# Patient Record
Sex: Female | Born: 1960 | Race: White | Hispanic: No | Marital: Single | Smoking: Current every day smoker
Health system: Southern US, Community
[De-identification: ages and names within clinical notes are randomized; demographics above are authoritative.]

## PROBLEM LIST (undated history)

## (undated) DIAGNOSIS — F32A Depression, unspecified: Secondary | ICD-10-CM

## (undated) DIAGNOSIS — M5030 Other cervical disc degeneration, unspecified cervical region: Secondary | ICD-10-CM

## (undated) DIAGNOSIS — Z973 Presence of spectacles and contact lenses: Secondary | ICD-10-CM

## (undated) DIAGNOSIS — L4050 Arthropathic psoriasis, unspecified: Secondary | ICD-10-CM

## (undated) DIAGNOSIS — I10 Essential (primary) hypertension: Secondary | ICD-10-CM

## (undated) DIAGNOSIS — J449 Chronic obstructive pulmonary disease, unspecified: Secondary | ICD-10-CM

## (undated) DIAGNOSIS — K219 Gastro-esophageal reflux disease without esophagitis: Secondary | ICD-10-CM

## (undated) DIAGNOSIS — J45909 Unspecified asthma, uncomplicated: Secondary | ICD-10-CM

## (undated) DIAGNOSIS — M1990 Unspecified osteoarthritis, unspecified site: Secondary | ICD-10-CM

## (undated) DIAGNOSIS — F329 Major depressive disorder, single episode, unspecified: Secondary | ICD-10-CM

## (undated) HISTORY — PX: TONSILLECTOMY: SUR1361

## (undated) HISTORY — PX: TUBAL LIGATION: SHX77

---

## 2013-05-25 ENCOUNTER — Inpatient Hospital Stay: Payer: Self-pay | Admitting: Internal Medicine

## 2013-05-25 LAB — CBC
HCT: 49.4 % — ABNORMAL HIGH (ref 35.0–47.0)
HGB: 16.3 g/dL — ABNORMAL HIGH (ref 12.0–16.0)
MCH: 32.2 pg (ref 26.0–34.0)
MCHC: 33.1 g/dL (ref 32.0–36.0)
MCV: 97 fL (ref 80–100)
Platelet: 168 10*3/uL (ref 150–440)
RBC: 5.07 10*6/uL (ref 3.80–5.20)
RDW: 14.5 % (ref 11.5–14.5)
WBC: 7.4 10*3/uL (ref 3.6–11.0)

## 2013-05-25 LAB — BASIC METABOLIC PANEL
Anion Gap: 2 — ABNORMAL LOW (ref 7–16)
BUN: 13 mg/dL (ref 7–18)
CHLORIDE: 101 mmol/L (ref 98–107)
CO2: 33 mmol/L — AB (ref 21–32)
CREATININE: 0.59 mg/dL — AB (ref 0.60–1.30)
Calcium, Total: 8.8 mg/dL (ref 8.5–10.1)
EGFR (African American): >60
EGFR (Non-African Amer.): >60
GLUCOSE: 108 mg/dL — AB (ref 65–99)
OSMOLALITY: 273 (ref 275–301)
Potassium: 4.1 mmol/L (ref 3.5–5.1)
SODIUM: 136 mmol/L (ref 136–145)

## 2013-05-25 LAB — TROPONIN I: Troponin-I: <0.02 ng/mL

## 2013-05-26 LAB — BASIC METABOLIC PANEL
Anion Gap: 1 — ABNORMAL LOW (ref 7–16)
BUN: 12 mg/dL (ref 7–18)
CALCIUM: 8.7 mg/dL (ref 8.5–10.1)
CHLORIDE: 101 mmol/L (ref 98–107)
CREATININE: 0.54 mg/dL — AB (ref 0.60–1.30)
Co2: 31 mmol/L (ref 21–32)
EGFR (Non-African Amer.): >60
Glucose: 141 mg/dL — ABNORMAL HIGH (ref 65–99)
Osmolality: 268 (ref 275–301)
POTASSIUM: 3.8 mmol/L (ref 3.5–5.1)
SODIUM: 133 mmol/L — AB (ref 136–145)

## 2013-05-26 LAB — CBC WITH DIFFERENTIAL/PLATELET
BASOS ABS: 0 10*3/uL (ref 0.0–0.1)
BASOS PCT: 0.4 %
Eosinophil #: 0 10*3/uL (ref 0.0–0.7)
Eosinophil %: 0 %
HCT: 47.2 % — AB (ref 35.0–47.0)
HGB: 15.6 g/dL (ref 12.0–16.0)
LYMPHS ABS: 0.3 10*3/uL — AB (ref 1.0–3.6)
Lymphocyte %: 7.5 %
MCH: 32.1 pg (ref 26.0–34.0)
MCHC: 33 g/dL (ref 32.0–36.0)
MCV: 97 fL (ref 80–100)
Monocyte #: 0.4 x10 3/mm (ref 0.2–0.9)
Monocyte %: 9.1 %
Neutrophil #: 3.3 10*3/uL (ref 1.4–6.5)
Neutrophil %: 83 %
PLATELETS: 152 10*3/uL (ref 150–440)
RBC: 4.85 10*6/uL (ref 3.80–5.20)
RDW: 14.6 % — AB (ref 11.5–14.5)
WBC: 4 10*3/uL (ref 3.6–11.0)

## 2014-01-13 ENCOUNTER — Ambulatory Visit: Payer: Self-pay

## 2014-09-03 NOTE — H&P (Signed)
PATIENT NAME:  Dana Hill, Dana Hill MR#:  [id number] DATE OF BIRTH:  [birth year]  DATE OF ADMISSION:  05/25/2013  PRIMARY CARE PHYSICIAN: Nonlocal.   REFERRING PHYSICIAN: Dr. Ladona Ridgel  CHIEF COMPLAINT: Shortness of breath.   HISTORY OF PRESENT ILLNESS: The patient is a 54 year old female with past medical history of asthma, COPD. Still continues to smoke. Presented to the ER with the chief complaint of shortness of breath. Her shortness of breath has started yesterday and she was unable to lie down because of shortness of breath. At one point she could not breathe and she felt like air is not moving. The patient was wheezing on tripoding with severe shortness of breath. She has been given 1 DuoNeb treatment. Pulse ox was at 88% on room air. The patient was placed on oxygen 2 liters and oxygen went up to 98%. The patient is brought into the ER via EMS. She was given back-to-back neb treatments and Solu-Medrol was given. Chest x-ray has revealed hyperinflated lungs. No pneumonia or pneumothorax. With oxygen, Solu-Medrol and nebulizer treatments the patient started feeling better, but still she is dyspneic with minimal exertion. Hospitalist team is called to admit the patient. During my examination, the patient is reporting that she has been having cold with runny nose, congestion, and productive cough for the past 2 days. Shortness of breath has suddenly gotten worse since yesterday. During my examination, the patient is sitting comfortably and speaking in full sentences. She has reported that her condition is significantly improved after breathing treatments. She admits that she still continues to smoke, but cut down the number of cigarettes. No family members are at bedside. Denies any chest pain. No abdominal pain, nausea, or vomiting.   PAST MEDICAL HISTORY: Hypertension, asthma, psoriasis, COPD.  PAST SURGICAL HISTORY: None.   ALLERGIES: No known drug allergies.   PSYCHOSOCIAL HISTORY: Lives at home. She has  smoked 20 years. She used to smoke 1 pack a day, but recently she cut down the number of cigarettes. Denies alcohol or illicit drug usage.   FAMILY HISTORY: Mother has hypertension. Dad has coronary artery disease.   HOME MEDICATIONS: Methotrexate 7.5 mg subcutaneously, Humira, folic acid orally dose unknown, Celexa 40 mg p.o. once daily.   REVIEW OF SYSTEMS: CONSTITUTIONAL: Denies any fever. Complaining of fatigue.  EYES: Denies blurry vision, double vision.  ENT: Denies epistaxis. Complaining of nasal discharge and congestion.  RESPIRATORY: Complaining of cough. Has COPD and asthma. Denies any hemoptysis.  CARDIOVASCULAR: No chest pain or palpitations.  GASTROINTESTINAL: Denies nausea, vomiting, diarrhea.  GENITOURINARY: No dysuria or hematuria.  ENDOCRINE: Denies polyuria, nocturia, thyroid problems.  HEMATOLOGIC AND LYMPHATIC: No anemia, easy bruising, bleeding.  INTEGUMENT: No acne, rash, lesions. She has chronic history of psoriasis. MUSCULOSKELETAL: No joint pain in the neck and back. Denies gout.  NEUROLOGIC: No vertigo, ataxia.  PSYCHIATRIC: No ADD, OCD.   PHYSICAL EXAMINATION: VITAL SIGNS: Pulse 90, respiration 18, blood pressure 169/78, pulse ox 96% on 2 liters.  GENERAL APPEARANCE: Not in acute distress. Moderately built and nourished.  HEENT: Normocephalic, atraumatic. Pupils are equally reactive to light and accommodation. No scleral icterus. No conjunctival injection. No sinus tenderness. No postnasal drip. Nares are patent but congested.  NECK: Supple. No JVD. No thyromegaly.  LUNGS: Moderate air entry. Coarse bronchial breath sounds and expiratory wheezing is present. No accessory muscle usage. No anterior chest wall tenderness on palpation.  CARDIAC: S1 and S2 normal. Regular rate and rhythm. No murmurs.  GASTROINTESTINAL: Soft. Bowel sounds are positive in  all quadrants. Nontender, nondistended. No hepatosplenomegaly. No masses felt.  NEUROLOGIC: Awake, alert, and  oriented x3. Cranial nerves II through XII are grossly intact. Motor and sensory are intact. Reflexes are 2+.  EXTREMITIES: No edema. No cyanosis. No clubbing. No peripheral edema. Peripheral pulses are 2+.  SKIN: Warm to touch. Normal turgor. No psoriatic plaques were noted at this time.  PSYCHIATRIC: Normal mood and affect.   LABORATORY AND IMAGING STUDIES: Troponin less than 0.02. WBC 7.4, hemoglobin 16.3, hematocrit 49.4, and platelets are 168. ABG on 36% FiO2: PH 7.32, pCO2 62, pO2 184, and bicarb 31.9. Glucose 108, BUN 13, creatinine 0.59, sodium 136, potassium 4.1, chloride 101, CO2 33. Anion gap 2. GFR greater than 60. Serum osmolality 273. Calcium 8.8.   Chest x-ray: Hyperinflation. No pneumonia, pneumothorax.   A 12-lead EKG: Normal sinus rhythm with sinus tachycardia at 92 beats per minute, normal PR and QRS interval. No acute ST-T wave changes.   ASSESSMENT AND PLAN: A 54 year old female presenting to the ER with the chief complaint of nasal congestion and cough associated with shortness of breath since yesterday. Will be admitted with following assessment and plan.  1.  Acute respiratory distress with hypoxia, probably from acute exacerbation of chronic obstructive pulmonary disease. Still continues to smoke. Will admit her to telemetry bed. Solu-Medrol IV will be provided. DuoNeb nebulizer treatments and albuterol treatments as needed. Levofloxacin IV.  2.  Chronic history of psoriasis. The patient is on Humira and methotrexate. Follow up with her physician as scheduled.  3.  Hypertension. Resume her home medication after med reconciliation.  4.  History of smoking. Still continues to smoke. Counseled the patient to quit smoking. The patient verbalized understanding. Three minutes of counseling was provided. 5.  We will provide gastrointestinal and deep vein thrombosis prophylaxis.   The diagnosis and plan of care was discussed in detail with the patient. She is aware of the plan.    TOTAL TIME SPENT ON ADMISSION: 45 minutes.   ____________________________ Ramonita LabAruna Dajon Lazar, MD ag:sb D: 05/25/2013 08:11:43 ET T: 05/25/2013 08:44:23 ET JOB#: [id number]  cc: Ramonita LabAruna Aylen Stradford, MD, <Dictator> Ramonita LabARUNA Gyan Cambre MD ELECTRONICALLY SIGNED 06/06/2013 7:06

## 2014-09-03 NOTE — Discharge Summary (Signed)
PATIENT NAME:  Dana Hill, Dana Hill MR#:  [id number] DATE OF BIRTH:  [birth year]  DATE OF ADMISSION:  05/25/2013 DATE OF DISCHARGE:  05/26/2013  ADMITTING PHYSICIAN: Dr. Amado CoeGouru   DISCHARGING PHYSICIAN: Dr. Enid Baasadhika Marbeth Smedley.   PRIMARY CARE PHYSICIAN: Nonlocal.   DISCHARGE DIAGNOSES: 1.  Acute on chronic obstructive pulmonary disease exacerbation.  2.  Acute bronchitis.  3.  Hypertension.  4.  Psoriasis.  5.  Depression/anxiety.   DISCHARGE MEDICATIONS: 1.  Humira subcutaneously every 2 weeks 40 mg per 0.8 mL  liquid 2.  Methotrexate 7.5 mg subcutaneously weekly.  3.  Celexa 40 mg p.o. daily. 4.  Folic acid orally 1 tablet p.o. daily.  5.  Ibuprofen 800 mg  q. 8 hours p.r.n.  6.  Prednisone taper over 6 days.  7.  Symbicort 160/4.5, two puffs b.i.d.  8.  Spiriva 1 capsule inhalation daily.  9.  Albuterol inhaler 2 puffs q. 6 hours p.r.n. for wheezing or shortness of breath.  10.  Levaquin 500 mg p.o. daily for 5 more days.  11.  Xanax 0.25 mg p.o. b.i.d. p.r.n. for anxiety.   DISCHARGE HOME OXYGEN: None.   DISCHARGE DIET: Low-sodium diet.   DISCHARGE ACTIVITY: As tolerated.    FOLLOWUP INSTRUCTIONS: 1.  PCP follow-up in 1 to 2 weeks.  2.  Smoking cessation.   LABORATORIES AND IMAGING STUDIES PRIOR TO DISCHARGE: WBC 4.0, hemoglobin 15.6, hematocrit 47.2, platelet count 152.   Sodium 133, potassium 3.8, chloride 101, bicarbonate 31, BUN12  , creatinine 0.54, glucose 141 and calcium of 8.7.   ABG showing pH of 7.32, pCO2 62, pO2 184, bicarbonate 31.9 and sats of 98% on 3 liters nasal cannula. Chest x-ray showing hyperinflated lung fields. No pneumonia or pneumothorax.   BRIEF HOSPITAL COURSE: Dana Hill is a 54 year old Caucasian female with past medical history significant for COPD, not on home oxygen, ongoing tobacco use, hypertension, psoriasis who is on immunosuppressants, presents to the hospital secondary to worsening shortness of breath.  1.  Acute on chronic COPD exacerbation  with acute bronchitis. She was requiring up to 3 to 4 liters of oxygen initially on admission. Started on IV steroids, DuoNebs and inhalers. Spiriva was also added to prevent further exacerbation. The patient is feeling a lot better and is being tapered off the steroids and is being discharged home on inhalers and also steroids.  She was on Levaquin for bronchitis symptoms which are improving at this time and is being discharged on Levaquin as well.  The patient is strongly counseled against smoking at the time of discharge and is motivated to quit as she already has some patches at home.  2.  Depression and anxiety. The patient is very anxious, especially anxiety worsening with steroid use so she is being discharged on low-dose Xanax. She is already on Celexa at home.  3.  Psoriasis.  Follow up with her dermatologist. She is on Humira, methotrexate.  4.  Hypertension diet-controlled.   Her course has been otherwise uneventful in the hospital.   DISCHARGE CONDITION: Stable.   DISCHARGE DISPOSITION: Home.   TIME SPENT ON DISCHARGE: 45 minutes.    ____________________________ Enid Baasadhika Shashwat Cleary, MD rk:dp D: 05/26/2013 15:37:02 ET T: 05/26/2013 16:55:23 ET JOB#: [id number]  cc: Enid Baasadhika Casmer Yepiz, MD, <Dictator> Enid BaasADHIKA Kassadee Carawan MD ELECTRONICALLY SIGNED 05/27/2013 14:14

## 2015-02-15 ENCOUNTER — Ambulatory Visit: Payer: BLUE CROSS/BLUE SHIELD | Admitting: Podiatry

## 2015-06-23 ENCOUNTER — Other Ambulatory Visit: Payer: Self-pay | Admitting: Rheumatology

## 2015-06-23 DIAGNOSIS — R109 Unspecified abdominal pain: Secondary | ICD-10-CM

## 2015-07-03 ENCOUNTER — Ambulatory Visit: Payer: BLUE CROSS/BLUE SHIELD

## 2015-07-10 ENCOUNTER — Ambulatory Visit
Admission: RE | Admit: 2015-07-10 | Discharge: 2015-07-10 | Disposition: A | Discharge status: Home / Self Care | Payer: BLUE CROSS/BLUE SHIELD | Source: Ambulatory Visit | Attending: Rheumatology | Admitting: Rheumatology

## 2015-07-10 DIAGNOSIS — R1084 Generalized abdominal pain: Secondary | ICD-10-CM | POA: Diagnosis not present

## 2015-07-10 DIAGNOSIS — R109 Unspecified abdominal pain: Secondary | ICD-10-CM

## 2015-07-10 DIAGNOSIS — K8689 Other specified diseases of pancreas: Secondary | ICD-10-CM | POA: Diagnosis not present

## 2015-08-22 ENCOUNTER — Other Ambulatory Visit: Payer: Self-pay | Admitting: Rheumatology

## 2015-08-22 DIAGNOSIS — M542 Cervicalgia: Secondary | ICD-10-CM

## 2015-09-12 ENCOUNTER — Ambulatory Visit
Admission: RE | Admit: 2015-09-12 | Discharge: 2015-09-12 | Disposition: A | Discharge status: Home / Self Care | Payer: BLUE CROSS/BLUE SHIELD | Source: Ambulatory Visit | Attending: Rheumatology | Admitting: Rheumatology

## 2015-09-12 DIAGNOSIS — M542 Cervicalgia: Secondary | ICD-10-CM

## 2015-09-15 ENCOUNTER — Ambulatory Visit
Admission: RE | Admit: 2015-09-15 | Discharge: 2015-09-15 | Disposition: A | Discharge status: Home / Self Care | Payer: BLUE CROSS/BLUE SHIELD | Source: Ambulatory Visit | Attending: Rheumatology | Admitting: Rheumatology

## 2015-09-15 DIAGNOSIS — M5031 Other cervical disc degeneration,  high cervical region: Secondary | ICD-10-CM | POA: Diagnosis not present

## 2015-09-15 DIAGNOSIS — M4802 Spinal stenosis, cervical region: Secondary | ICD-10-CM | POA: Diagnosis not present

## 2015-09-15 DIAGNOSIS — M1388 Other specified arthritis, other site: Secondary | ICD-10-CM | POA: Insufficient documentation

## 2015-09-15 DIAGNOSIS — M542 Cervicalgia: Secondary | ICD-10-CM | POA: Diagnosis present

## 2016-04-18 ENCOUNTER — Encounter: Payer: Self-pay | Admitting: *Deleted

## 2016-04-19 NOTE — Discharge Instructions (Signed)
Cataract Surgery, Care After °Refer to this sheet in the next few weeks. These instructions provide you with information about caring for yourself after your procedure. Your health care provider may also give you more specific instructions. Your treatment has been planned according to current medical practices, but problems sometimes occur. Call your health care provider if you have any problems or questions after your procedure. °What can I expect after the procedure? °After the procedure, it is common to have: °· Itching. °· Discomfort. °· Fluid discharge. °· Sensitivity to light and to touch. °· Bruising. °Follow these instructions at home: °Eye Care  °· Check your eye every day for signs of infection. Watch for: °¨ Redness, swelling, or pain. °¨ Fluid, blood, or pus. °¨ Warmth. °¨ Bad smell. °Activity  °· Avoid strenuous activities, such as playing contact sports, for as long as told by your health care provider. °· Do not drive or operate heavy machinery until your health care provider approves. °· Do not bend or lift heavy objects . Bending increases pressure in the eye. You can walk, climb stairs, and do light household chores. °· Ask your health care provider when you can return to work. If you work in a dusty environment, you may be advised to wear protective eyewear for a period of time. °General instructions  °· Take or apply over-the-counter and prescription medicines only as told by your health care provider. This includes eye drops. °· Do not touch or rub your eyes. °· If you were given a protective shield, wear it as told by your health care provider. If you were not given a protective shield, wear sunglasses as told by your health care provider to protect your eyes. °· Keep the area around your eye clean and dry. Avoid swimming or allowing water to hit you directly in the face while showering until told by your health care provider. Keep soap and shampoo out of your eyes. °· Do not put a contact lens  into the affected eye or eyes until your health care provider approves. °· Keep all follow-up visits as told by your health care provider. This is important. °Contact a health care provider if: ° °· You have increased bruising around your eye. °· You have pain that is not helped with medicine. °· You have a fever. °· You have redness, swelling, or pain in your eye. °· You have fluid, blood, or pus coming from your incision. °· Your vision gets worse. °Get help right away if: °· You have sudden vision loss. °This information is not intended to replace advice given to you by your health care provider. Make sure you discuss any questions you have with your health care provider. °Document Released: 11/16/2004 Document Revised: 09/07/2015 Document Reviewed: 03/09/2015 °Elsevier Interactive Patient Education © 2017 [employer] ° ° ° ° °General Anesthesia, Adult, Care After °These instructions provide you with information about caring for yourself after your procedure. Your health care provider may also give you more specific instructions. Your treatment has been planned according to current medical practices, but problems sometimes occur. Call your health care provider if you have any problems or questions after your procedure. °What can I expect after the procedure? °After the procedure, it is common to have: °· Vomiting. °· A sore throat. °· Mental slowness. °It is common to feel: °· Nauseous. °· Cold or shivery. °· Sleepy. °· Tired. °· Sore or achy, even in parts of your body where you did not have surgery. °Follow these instructions at   home: °For at least 24 hours after the procedure:  °· Do not: °¨ Participate in activities where you could fall or become injured. °¨ Drive. °¨ Use heavy machinery. °¨ Drink alcohol. °¨ Take sleeping pills or medicines that cause drowsiness. °¨ Make important decisions or sign legal documents. °¨ Take care of children on your own. °· Rest. °Eating and drinking  °· If you vomit, drink  water, juice, or soup when you can drink without vomiting. °· Drink enough fluid to keep your urine clear or pale yellow. °· Make sure you have little or no nausea before eating solid foods. °· Follow the diet recommended by your health care provider. °General instructions  °· Have a responsible adult stay with you until you are awake and alert. °· Return to your normal activities as told by your health care provider. Ask your health care provider what activities are safe for you. °· Take over-the-counter and prescription medicines only as told by your health care provider. °· If you smoke, do not smoke without supervision. °· Keep all follow-up visits as told by your health care provider. This is important. °Contact a health care provider if: °· You continue to have nausea or vomiting at home, and medicines are not helpful. °· You cannot drink fluids or start eating again. °· You cannot urinate after 8-12 hours. °· You develop a skin rash. °· You have fever. °· You have increasing redness at the site of your procedure. °Get help right away if: °· You have difficulty breathing. °· You have chest pain. °· You have unexpected bleeding. °· You feel that you are having a life-threatening or urgent problem. °This information is not intended to replace advice given to you by your health care provider. Make sure you discuss any questions you have with your health care provider. °Document Released: 08/05/2000 Document Revised: 10/02/2015 Document Reviewed: 04/13/2015 °Elsevier Interactive Patient Education © 2017 [employer] ° °

## 2016-04-24 ENCOUNTER — Ambulatory Visit: Payer: BLUE CROSS/BLUE SHIELD | Admitting: Anesthesiology

## 2016-04-24 ENCOUNTER — Encounter
Admission: RE | Disposition: A | Discharge status: Home / Self Care | Payer: Self-pay | Source: Ambulatory Visit | Attending: Ophthalmology

## 2016-04-24 ENCOUNTER — Ambulatory Visit
Admission: RE | Admit: 2016-04-24 | Discharge: 2016-04-24 | Disposition: A | Discharge status: Home / Self Care | Payer: BLUE CROSS/BLUE SHIELD | Source: Ambulatory Visit | Attending: Ophthalmology | Admitting: Ophthalmology

## 2016-04-24 DIAGNOSIS — L4050 Arthropathic psoriasis, unspecified: Secondary | ICD-10-CM | POA: Insufficient documentation

## 2016-04-24 DIAGNOSIS — F329 Major depressive disorder, single episode, unspecified: Secondary | ICD-10-CM | POA: Diagnosis not present

## 2016-04-24 DIAGNOSIS — J449 Chronic obstructive pulmonary disease, unspecified: Secondary | ICD-10-CM | POA: Insufficient documentation

## 2016-04-24 DIAGNOSIS — I10 Essential (primary) hypertension: Secondary | ICD-10-CM | POA: Diagnosis not present

## 2016-04-24 DIAGNOSIS — M7989 Other specified soft tissue disorders: Secondary | ICD-10-CM | POA: Diagnosis not present

## 2016-04-24 DIAGNOSIS — M810 Age-related osteoporosis without current pathological fracture: Secondary | ICD-10-CM | POA: Insufficient documentation

## 2016-04-24 DIAGNOSIS — H2512 Age-related nuclear cataract, left eye: Secondary | ICD-10-CM | POA: Diagnosis present

## 2016-04-24 DIAGNOSIS — K219 Gastro-esophageal reflux disease without esophagitis: Secondary | ICD-10-CM | POA: Diagnosis not present

## 2016-04-24 HISTORY — DX: Depression, unspecified: F32.A

## 2016-04-24 HISTORY — DX: Unspecified osteoarthritis, unspecified site: M19.90

## 2016-04-24 HISTORY — DX: Presence of spectacles and contact lenses: Z97.3

## 2016-04-24 HISTORY — DX: Major depressive disorder, single episode, unspecified: F32.9

## 2016-04-24 HISTORY — DX: Chronic obstructive pulmonary disease, unspecified: J44.9

## 2016-04-24 HISTORY — DX: Essential (primary) hypertension: I10

## 2016-04-24 HISTORY — DX: Other cervical disc degeneration, unspecified cervical region: M50.30

## 2016-04-24 HISTORY — PX: CATARACT EXTRACTION W/PHACO: SHX586

## 2016-04-24 HISTORY — DX: Arthropathic psoriasis, unspecified: L40.50

## 2016-04-24 HISTORY — DX: Gastro-esophageal reflux disease without esophagitis: K21.9

## 2016-04-24 HISTORY — DX: Unspecified asthma, uncomplicated: J45.909

## 2016-04-24 SURGERY — PHACOEMULSIFICATION, CATARACT, WITH IOL INSERTION
Anesthesia: Monitor Anesthesia Care | Site: Eye | Laterality: Left | Wound class: Clean

## 2016-04-24 MED ORDER — LACTATED RINGERS IV SOLN: INTRAVENOUS | Status: DC

## 2016-04-24 MED ORDER — ACETAMINOPHEN 325 MG PO TABS: 325.0000 mg | ORAL_TABLET | ORAL | Status: DC | PRN

## 2016-04-24 MED ORDER — MIDAZOLAM HCL 2 MG/2ML IJ SOLN
INTRAMUSCULAR | Status: DC | PRN
  Administered 2016-04-24: 2 mg via INTRAVENOUS

## 2016-04-24 MED ORDER — TIMOLOL MALEATE 0.5 % OP SOLN
OPHTHALMIC | Status: DC | PRN
  Administered 2016-04-24: 1 [drp] via OPHTHALMIC

## 2016-04-24 MED ORDER — CEFUROXIME OPHTHALMIC INJECTION 1 MG/0.1 ML
INJECTION | OPHTHALMIC | Status: DC | PRN
Start: 2016-04-24 — End: 2016-04-24
  Administered 2016-04-24: .3 mL via INTRACAMERAL

## 2016-04-24 MED ORDER — LIDOCAINE HCL (PF) 4 % IJ SOLN
INTRAOCULAR | Status: DC | PRN
  Administered 2016-04-24: 1 mL via OPHTHALMIC

## 2016-04-24 MED ORDER — FENTANYL CITRATE (PF) 100 MCG/2ML IJ SOLN
INTRAMUSCULAR | Status: DC | PRN
  Administered 2016-04-24: 50 ug via INTRAVENOUS

## 2016-04-24 MED ORDER — NA HYALUR & NA CHOND-NA HYALUR 0.4-0.35 ML IO KIT
PACK | INTRAOCULAR | Status: DC | PRN
  Administered 2016-04-24: 1 mL via INTRAOCULAR

## 2016-04-24 MED ORDER — ACETAMINOPHEN 160 MG/5ML PO SOLN: 325.0000 mg | ORAL | Status: DC | PRN

## 2016-04-24 MED ORDER — BRIMONIDINE TARTRATE 0.2 % OP SOLN
OPHTHALMIC | Status: DC | PRN
  Administered 2016-04-24: 1 [drp] via OPHTHALMIC

## 2016-04-24 MED ORDER — EPINEPHRINE PF 1 MG/ML IJ SOLN
INTRAMUSCULAR | Status: DC | PRN
  Administered 2016-04-24: 53 mL via OPHTHALMIC

## 2016-04-24 MED ORDER — ALBUTEROL SULFATE (2.5 MG/3ML) 0.083% IN NEBU
2.5000 mg | INHALATION_SOLUTION | Freq: Once | RESPIRATORY_TRACT | Status: AC
  Administered 2016-04-24: 2.5 mg via RESPIRATORY_TRACT

## 2016-04-24 MED ORDER — ARMC OPHTHALMIC DILATING DROPS
1.0000 "application " | OPHTHALMIC | Status: DC | PRN
  Administered 2016-04-24 (×3): 1 via OPHTHALMIC

## 2016-04-24 MED ORDER — MOXIFLOXACIN HCL 0.5 % OP SOLN
1.0000 [drp] | OPHTHALMIC | Status: DC | PRN
  Administered 2016-04-24 (×3): 1 [drp] via OPHTHALMIC

## 2016-04-24 SURGICAL SUPPLY — 25 items
CANNULA ANT/CHMB 27GA (MISCELLANEOUS) ×3 IMPLANT
CARTRIDGE ABBOTT (MISCELLANEOUS) IMPLANT
GLOVE SURG LX 7.5 STRW (GLOVE) ×2
GLOVE SURG LX STRL 7.5 STRW (GLOVE) ×1 IMPLANT
GLOVE SURG TRIUMPH 8.0 PF LTX (GLOVE) ×3 IMPLANT
GOWN STRL REUS W/ TWL LRG LVL3 (GOWN DISPOSABLE) ×2 IMPLANT
GOWN STRL REUS W/TWL LRG LVL3 (GOWN DISPOSABLE) ×4
LENS IOL TECNIS ITEC 22.5 (Intraocular Lens) ×3 IMPLANT
MARKER SKIN DUAL TIP RULER LAB (MISCELLANEOUS) ×3 IMPLANT
NDL RETROBULBAR .5 NSTRL (NEEDLE) IMPLANT
NEEDLE FILTER BLUNT 18X 1/2SAF (NEEDLE) ×2
NEEDLE FILTER BLUNT 18X1 1/2 (NEEDLE) ×1 IMPLANT
PACK CATARACT BRASINGTON (MISCELLANEOUS) ×3 IMPLANT
PACK EYE AFTER SURG (MISCELLANEOUS) ×3 IMPLANT
PACK OPTHALMIC (MISCELLANEOUS) ×3 IMPLANT
RING MALYGIN 7.0 (MISCELLANEOUS) IMPLANT
SUT ETHILON 10-0 CS-B-6CS-B-6 (SUTURE)
SUT VICRYL  9 0 (SUTURE)
SUT VICRYL 9 0 (SUTURE) IMPLANT
SUTURE EHLN 10-0 CS-B-6CS-B-6 (SUTURE) IMPLANT
SYR 3ML LL SCALE MARK (SYRINGE) ×3 IMPLANT
SYR 5ML LL (SYRINGE) ×3 IMPLANT
SYR TB 1ML LUER SLIP (SYRINGE) ×3 IMPLANT
WATER STERILE IRR 250ML POUR (IV SOLUTION) ×3 IMPLANT
WIPE NON LINTING 3.25X3.25 (MISCELLANEOUS) ×3 IMPLANT

## 2016-04-24 NOTE — Anesthesia Postprocedure Evaluation (Signed)
Anesthesia Post Note  Patient: Dana Hill  Procedure(s) Performed: Procedure(s) (LRB): CATARACT EXTRACTION PHACO AND INTRAOCULAR LENS PLACEMENT (IOC) (Left)  Patient location during evaluation: PACU Anesthesia Type: MAC Level of consciousness: awake and alert Pain management: pain level controlled Vital Signs Assessment: post-procedure vital signs reviewed and stable Respiratory status: spontaneous breathing, nonlabored ventilation, respiratory function stable and patient connected to nasal cannula oxygen Cardiovascular status: stable and blood pressure returned to baseline Anesthetic complications: no    Alta CorningBacon, Mackie Goon S

## 2016-04-24 NOTE — Transfer of Care (Signed)
Immediate Anesthesia Transfer of Care Note  Patient: Dana AbrahamsRobin Mcguffee  Procedure(s) Performed: Procedure(s): CATARACT EXTRACTION PHACO AND INTRAOCULAR LENS PLACEMENT (IOC) (Left)  Patient Location: PACU  Anesthesia Type: MAC  Level of Consciousness: awake, alert  and patient cooperative  Airway and Oxygen Therapy: Patient Spontanous Breathing and Patient connected to supplemental oxygen  Post-op Assessment: Post-op Vital signs reviewed, Patient's Cardiovascular Status Stable, Respiratory Function Stable, Patent Airway and No signs of Nausea or vomiting  Post-op Vital Signs: Reviewed and stable  Complications: No apparent anesthesia complications

## 2016-04-24 NOTE — Anesthesia Procedure Notes (Signed)
Procedure Name: MAC Performed by: Stacyann Mcconaughy Pre-anesthesia Checklist: Patient identified, Emergency Drugs available, Suction available, Timeout performed and Patient being monitored Patient Re-evaluated:Patient Re-evaluated prior to inductionOxygen Delivery Method: Nasal cannula Placement Confirmation: positive ETCO2     

## 2016-04-24 NOTE — Op Note (Signed)
OPERATIVE NOTE  Eldridge AbrahamsRobin Threats [id number] 04/24/2016   PREOPERATIVE DIAGNOSIS:  Nuclear sclerotic cataract left eye. H25.12   POSTOPERATIVE DIAGNOSIS:    Nuclear sclerotic cataract left eye.     PROCEDURE:  Phacoemusification with posterior chamber intraocular lens placement of the left eye   LENS:   Implant Name Type Inv. Item Serial No. Manufacturer Lot No. LRB No. Used  LENS IOL DIOP 22.5 - [id number] Intraocular Lens LENS IOL DIOP 22.5 [id number]   Left 1        ULTRASOUND TIME: 15  % of 0 minutes 58 seconds, CDE 8.5  SURGEON:  Deirdre Evenerhadwick R. Dorance Spink, MD   ANESTHESIA:  Topical with tetracaine drops and 2% Xylocaine jelly, augmented with 1% preservative-free intracameral lidocaine.    COMPLICATIONS:  None.   DESCRIPTION OF PROCEDURE:  The patient was identified in the holding room and transported to the operating room and placed in the supine position under the operating microscope.  The left eye was identified as the operative eye and it was prepped and draped in the usual sterile ophthalmic fashion.   A 1 millimeter clear-corneal paracentesis was made at the 1:30 position.  0.5 ml of preservative-free 1% lidocaine was injected into the anterior chamber.  The anterior chamber was filled with Viscoat viscoelastic.  A 2.4 millimeter keratome was used to make a near-clear corneal incision at the 10:30 position.  .  A curvilinear capsulorrhexis was made with a cystotome and capsulorrhexis forceps.  Balanced salt solution was used to hydrodissect and hydrodelineate the nucleus.   Phacoemulsification was then used in stop and chop fashion to remove the lens nucleus and epinucleus.  The remaining cortex was then removed using the irrigation and aspiration handpiece. Provisc was then placed into the capsular bag to distend it for lens placement.  A lens was then injected into the capsular bag.  The remaining viscoelastic was aspirated.   Wounds were hydrated with balanced salt  solution.  The anterior chamber was inflated to a physiologic pressure with balanced salt solution.  No wound leaks were noted. Cefuroxime 0.1 ml of a 10mg /ml solution was injected into the anterior chamber for a dose of 1 mg of intracameral antibiotic at the completion of the case.   Timolol and Brimonidine drops were applied to the eye.  The patient was taken to the recovery room in stable condition without complications of anesthesia or surgery.  Laquida Cotrell 04/24/2016, 10:25 AM

## 2016-04-24 NOTE — Anesthesia Preprocedure Evaluation (Signed)
Anesthesia Evaluation  Patient identified by MRN, date of birth, ID band Patient awake    Reviewed: Allergy & Precautions, H&P , NPO status , Patient's Chart, lab work & pertinent test results, reviewed documented beta blocker date and time   Airway Mallampati: II  TM Distance: >3 FB Neck ROM: full    Dental no notable dental hx.    Pulmonary asthma , COPD,  COPD inhaler, Current Smoker,     + wheezing      Cardiovascular Exercise Tolerance: Good hypertension, Normal cardiovascular exam Rhythm:regular Rate:Normal     Neuro/Psych negative neurological ROS  negative psych ROS   GI/Hepatic Neg liver ROS, GERD  Medicated,  Endo/Other  negative endocrine ROS  Renal/GU negative Renal ROS  negative genitourinary   Musculoskeletal   Abdominal   Peds  Hematology negative hematology ROS (+)   Anesthesia Other Findings   Reproductive/Obstetrics negative OB ROS                             Anesthesia Physical Anesthesia Plan  ASA: III  Anesthesia Plan: MAC   Post-op Pain Management:    Induction:   Airway Management Planned:   Additional Equipment:   Intra-op Plan:   Post-operative Plan:   Informed Consent: I have reviewed the patients History and Physical, chart, labs and discussed the procedure including the risks, benefits and alternatives for the proposed anesthesia with the patient or authorized representative who has indicated his/her understanding and acceptance.   Dental Advisory Given  Plan Discussed with: CRNA and Anesthesiologist  Anesthesia Plan Comments:         Anesthesia Quick Evaluation

## 2016-04-24 NOTE — H&P (Signed)
The History and Physical notes are on paper, have been signed, and are to be scanned. The patient remains stable and unchanged from the H&P.   Previous H&P reviewed, patient examined, and there are no changes.  Dana Hill 04/24/2016 10:00 AM   

## 2016-04-25 ENCOUNTER — Encounter: Payer: Self-pay | Admitting: Ophthalmology

## 2017-06-17 ENCOUNTER — Other Ambulatory Visit: Payer: Self-pay

## 2017-06-17 ENCOUNTER — Encounter: Payer: BLUE CROSS/BLUE SHIELD | Attending: Internal Medicine

## 2017-06-17 VITALS — Ht 63.5 in | Wt 263.2 lb

## 2017-06-17 DIAGNOSIS — J439 Emphysema, unspecified: Secondary | ICD-10-CM | POA: Diagnosis present

## 2017-06-17 NOTE — Patient Instructions (Signed)
Patient Instructions  [name] MRN: [id number] Date of Birth: Nov 23, 1960 Referring Provider:  Sherol Dade, MD  Below are your personal goals for exercise, nutrition, and risk factors. Our goal is to help you stay on track towards obtaining and maintaining these goals. We will be discussing your progress on these goals with you throughout the program.  Initial Exercise Prescription: Initial Exercise Prescription - 06/17/17 1600      Date of Initial Exercise RX and Referring Provider   Date  06/17/17    Referring Provider  Dione Housekeeper MD      Oxygen   Oxygen  Continuous    Liters  3-4 4L on treadmill      Treadmill   MPH  1.5    Grade  0.5    Minutes  15    METs  2.25      Recumbant Elliptical   Level  1    RPM  50    Minutes  15    METs  2.1      T5 Nustep   Level  1    SPM  80    Minutes  15    METs  2.1      Prescription Details   Frequency (times per week)  3    Duration  Progress to 45 minutes of aerobic exercise without signs/symptoms of physical distress      Intensity   THRR 40-80% of Max Heartrate  106-143    Ratings of Perceived Exertion  11-13    Perceived Dyspnea  0-4      Progression   Progression  Continue to progress workloads to maintain intensity without signs/symptoms of physical distress.      Resistance Training   Training Prescription  Yes    Weight  3 lbs    Reps  10-15       Exercise Goals: Frequency: Be able to perform aerobic exercise two to three times per week in program working toward 2-5 days per week of home exercise.  Intensity: Work with a perceived exertion of 11 (fairly light) - 15 (hard) while following your exercise prescription.  We will make changes to your prescription with you as you progress through the program.   Duration: Be able to do 30 to 45 minutes of continuous aerobic exercise in addition to a 5 minute warm-up and a 5 minute cool-down routine.   Nutrition Goals: Your  personal nutrition goals will be established when you do your nutrition analysis with the dietician.  The following are general nutrition guidelines to follow: Cholesterol < 200mg /day Sodium < 1500mg /day Fiber: Women over 50 yrs - 21 grams per day  Personal Goals: Personal Goals and Risk Factors at Admission - 06/17/17 1526      Core Components/Risk Factors/Patient Goals on Admission    Weight Management  Yes;Obesity;Weight Loss    Intervention  Weight Management: Develop a combined nutrition and exercise program designed to reach desired caloric intake, while maintaining appropriate intake of nutrient and fiber, sodium and fats, and appropriate energy expenditure required for the weight goal.;Weight Management: Provide education and appropriate resources to help participant work on and attain dietary goals.;Weight Management/Obesity: Establish reasonable short term and long term weight goals.;Obesity: Provide education and appropriate resources to help participant work on and attain dietary goals.    Admit Weight  263 lb 3.2 oz (119.4 kg)    Goal Weight: Short Term  258 lb (117 kg)    Goal  Weight: Long Term  160 lb (72.6 kg)    Expected Outcomes  Short Term: Continue to assess and modify interventions until short term weight is achieved;Long Term: Adherence to nutrition and physical activity/exercise program aimed toward attainment of established weight goal;Weight Maintenance: Understanding of the daily nutrition guidelines, which includes 25-35% calories from fat, 7% or less cal from saturated fats, less than 200mg  cholesterol, less than 1.5gm of sodium, & 5 or more servings of fruits and vegetables daily;Weight Loss: Understanding of general recommendations for a balanced deficit meal plan, which promotes 1-2 lb weight loss per week and includes a negative energy balance of [phone] kcal/d;Understanding recommendations for meals to include 15-35% energy as protein, 25-35% energy from fat, 35-60%  energy from carbohydrates, less than 200mg  of dietary cholesterol, 20-35 gm of total fiber daily;Understanding of distribution of calorie intake throughout the day with the consumption of 4-5 meals/snacks    Tobacco Cessation  Yes    Number of packs per day  4 cigarettes a day    Intervention  Assist the participant in steps to quit. Provide individualized education and counseling about committing to Tobacco Cessation, relapse prevention, and pharmacological support that can be provided by physician.;Education officer, environmentalffer self-teaching materials, assist with locating and accessing local/national Quit Smoking programs, and support quit date choice.    Expected Outcomes  Short Term: Will demonstrate readiness to quit, by selecting a quit date.;Long Term: Complete abstinence from all tobacco products for at least 12 months from quit date.;Short Term: Will quit all tobacco product use, adhering to prevention of relapse plan.    Improve shortness of breath with ADL's  Yes    Intervention  Provide education, individualized exercise plan and daily activity instruction to help decrease symptoms of SOB with activities of daily living.    Expected Outcomes  Short Term: Improve cardiorespiratory fitness to achieve a reduction of symptoms when performing ADLs;Long Term: Be able to perform more ADLs without symptoms or delay the onset of symptoms    Hypertension  Yes    Intervention  Provide education on lifestyle modifcations including regular physical activity/exercise, weight management, moderate sodium restriction and increased consumption of fresh fruit, vegetables, and low fat dairy, alcohol moderation, and smoking cessation.;Monitor prescription use compliance.    Expected Outcomes  Short Term: Continued assessment and intervention until BP is < 140/1890mm HG in hypertensive participants. < 130/1080mm HG in hypertensive participants with diabetes, heart failure or chronic kidney disease.;Long Term: Maintenance of blood pressure at  goal levels.       Tobacco Use Initial Evaluation: Social History   Tobacco Use  Smoking Status Current Every Day Smoker  . Packs/day: 0.50  . Years: 25.00  . Pack years: 12.50  . Types: Cigarettes  Smokeless Tobacco Never Used    Exercise Goals and Review: Exercise Goals    Row Name 06/17/17 1628             Exercise Goals   Increase Physical Activity  Yes       Intervention  Provide advice, education, support and counseling about physical activity/exercise needs.;Develop an individualized exercise prescription for aerobic and resistive training based on initial evaluation findings, risk stratification, comorbidities and participant's personal goals.       Expected Outcomes  Long Term: Add in home exercise to make exercise part of routine and to increase amount of physical activity.;Short Term: Attend rehab on a regular basis to increase amount of physical activity.;Long Term: Exercising regularly at least 3-5 days a week.  Increase Strength and Stamina  Yes       Intervention  Provide advice, education, support and counseling about physical activity/exercise needs.;Develop an individualized exercise prescription for aerobic and resistive training based on initial evaluation findings, risk stratification, comorbidities and participant's personal goals.       Expected Outcomes  Short Term: Increase workloads from initial exercise prescription for resistance, speed, and METs.;Short Term: Perform resistance training exercises routinely during rehab and add in resistance training at home;Long Term: Improve cardiorespiratory fitness, muscular endurance and strength as measured by increased METs and functional capacity ( )       Able to understand and use rate of perceived exertion (RPE) scale  Yes       Intervention  Provide education and explanation on how to use RPE scale       Expected Outcomes  Short Term: Able to use RPE daily in rehab to express subjective intensity  level;Long Term:  Able to use RPE to guide intensity level when exercising independently       Able to understand and use Dyspnea scale  Yes       Intervention  Provide education and explanation on how to use Dyspnea scale       Expected Outcomes  Short Term: Able to use Dyspnea scale daily in rehab to express subjective sense of shortness of breath during exertion;Long Term: Able to use Dyspnea scale to guide intensity level when exercising independently       Knowledge and understanding of Target Heart Rate Range (THRR)  Yes       Intervention  Provide education and explanation of THRR including how the numbers were predicted and where they are located for reference       Expected Outcomes  Short Term: Able to state/look up THRR;Short Term: Able to use daily as guideline for intensity in rehab;Long Term: Able to use THRR to govern intensity when exercising independently       Able to check pulse independently  Yes       Intervention  Provide education and demonstration on how to check pulse in carotid and radial arteries.;Review the importance of being able to check your own pulse for safety during independent exercise       Expected Outcomes  Short Term: Able to explain why pulse checking is important during independent exercise;Long Term: Able to check pulse independently and accurately       Understanding of Exercise Prescription  Yes       Intervention  Provide education, explanation, and written materials on patient's individual exercise prescription       Expected Outcomes  Short Term: Able to explain program exercise prescription;Long Term: Able to explain home exercise prescription to exercise independently          Copy of goals given to participant.

## 2017-06-17 NOTE — Progress Notes (Signed)
Pulmonary Individual Treatment Plan  [name] MRN: [id number] Date of Birth: 1960/11/29 Referring Provider:     Pulmonary Rehab from 06/17/2017 in [facility] Cardiac and Pulmonary Rehab  Referring Provider  Tereasa Coop MD      Initial Encounter Date:    Pulmonary Rehab from 06/17/2017 in [facility] Cardiac and Pulmonary Rehab  Date  06/17/17  Referring Provider  Tereasa Coop MD      Visit Diagnosis: Pulmonary emphysema, unspecified emphysema type ([place])  Patient's Home Medications on Admission:  Current Outpatient Medications:  .  albuterol (PROVENTIL HFA;VENTOLIN HFA) 108 (90 Base) MCG/ACT inhaler, Inhale 2 puffs into the lungs every 6 (six) hours as needed for wheezing or shortness of breath., Disp: , Rfl:  .  budesonide-formoterol (SYMBICORT) 160-4.5 MCG/ACT inhaler, Inhale 2 puffs into the lungs 2 (two) times daily., Disp: , Rfl:  .  buprenorphine-naloxone (SUBOXONE) 8-2 MG SUBL SL tablet, Place 1 tablet under the tongue 2 (two) times daily., Disp: , Rfl:  .  citalopram (CELEXA) 40 MG tablet, Take 40 mg by mouth daily., Disp: , Rfl:  .  estradiol (VIVELLE-DOT) 0.0375 MG/24HR, Place 1 patch onto the skin 2 (two) times a week., Disp: , Rfl:  .  fexofenadine (ALLEGRA) 180 MG tablet, Take 180 mg by mouth daily as needed for allergies or rhinitis., Disp: , Rfl:  .  hydrochlorothiazide (HYDRODIURIL) 25 MG tablet, Take 25 mg by mouth daily., Disp: , Rfl:  .  hydrOXYzine (ATARAX/VISTARIL) 25 MG tablet, Take 25 mg by mouth daily as needed for itching., Disp: , Rfl:  .  ibuprofen (ADVIL,MOTRIN) 800 MG tablet, Take 800 mg by mouth 2 (two) times daily as needed., Disp: , Rfl:  .  lamoTRIgine (LAMICTAL) 25 MG tablet, Take 25 mg by mouth 2 (two) times daily., Disp: , Rfl:  .  lisinopril (PRINIVIL,ZESTRIL) 40 MG tablet, Take 40 mg by mouth daily., Disp: , Rfl:  .  LORazepam (ATIVAN) 1 MG tablet, Take 1 mg by mouth 2 (two) times daily as needed for anxiety., Disp: , Rfl:  .   omeprazole (PRILOSEC) 20 MG capsule, Take 20 mg by mouth daily., Disp: , Rfl:  .  progesterone (PROMETRIUM) 100 MG capsule, Take 100 mg by mouth at bedtime., Disp: , Rfl:  .  Secukinumab (COSENTYX 300 DOSE) 150 MG/ML SOSY, Inject into the skin every 30 (thirty) days. Last dose 04/02/16, Disp: , Rfl:  .  spironolactone (ALDACTONE) 25 MG tablet, Take 25 mg by mouth daily., Disp: , Rfl:   Past Medical History: Past Medical History:  Diagnosis Date  . Asthma   . COPD (chronic obstructive pulmonary disease) ([place])   . Degenerative disc disease, cervical   . Depression   . GERD (gastroesophageal reflux disease)   . Hypertension   . Osteoarthritis   . Psoriatic arthritis ([place])   . Wears contact lenses    right eye    Tobacco Use: Social History   Tobacco Use  Smoking Status Current Every Day Smoker  . Packs/day: 0.50  . Years: 25.00  . Pack years: 12.50  . Types: Cigarettes  Smokeless Tobacco Never Used    Labs: Recent Review Flowsheet Data    There is no flowsheet data to display.       Pulmonary Assessment Scores: Pulmonary Assessment Scores    Row Name 06/17/17 1521         ADL UCSD   ADL Phase  Entry     SOB Score total  44  Rest  0     Walk  2     Stairs  4     Bath  2     Dress  1     Shop  3       CAT Score   CAT Score  21       mMRC Score   mMRC Score  3        Pulmonary Function Assessment: Pulmonary Function Assessment - 06/17/17 1522      Breath   Bilateral Breath Sounds  Wheezes    Shortness of Breath  Yes;Fear of Shortness of Breath;Limiting activity       Exercise Target Goals: Date: 06/17/17  Exercise Program Goal: Individual exercise prescription set using results from initial 6 min walk test and THRR while considering  patient's activity barriers and safety.    Exercise Prescription Goal: Initial exercise prescription builds to 30-45 minutes a day of aerobic activity, 2-3 days per week.  Home exercise guidelines will be given  to patient during program as part of exercise prescription that the participant will acknowledge.  Activity Barriers & Risk Stratification: Activity Barriers & Cardiac Risk Stratification - 06/17/17 1623      Activity Barriers & Cardiac Risk Stratification   Activity Barriers  Arthritis;Joint Problems;History of Falls;Muscular Weakness;Deconditioning;Shortness of Breath soriatic arthritis, knee pain       6 Minute Walk: 6 Minute Walk    Row Name 06/17/17 1620         6 Minute Walk   Phase  Initial     Distance  894 feet     Walk Time  6 minutes     # of Rest Breaks  0     MPH  1.69     METS  2.17     RPE  13     Perceived Dyspnea   2     VO2 Peak  7.6     Symptoms  Yes (comment)     Comments  SOB, hip pain 5-6/10, back pain 3/10     Resting HR  69 bpm     Resting BP  136/74     Resting Oxygen Saturation   93 % 88% on Room Air     Exercise Oxygen Saturation  during 6 min walk  88 %     Max Ex. HR  127 bpm     Max Ex. BP  146/84     2 Minute Post BP  134/76       Interval HR   1 Minute HR  86     2 Minute HR  111     3 Minute HR  107     4 Minute HR  127     5 Minute HR  108     6 Minute HR  112     2 Minute Post HR  75     Interval Heart Rate?  Yes       Interval Oxygen   Interval Oxygen?  Yes     Baseline Oxygen Saturation %  93 %     1 Minute Oxygen Saturation %  89 %     1 Minute Liters of Oxygen  3 L     2 Minute Oxygen Saturation %  88 %     2 Minute Liters of Oxygen  3 L     3 Minute Oxygen Saturation %  89 %     3 Minute Liters of  Oxygen  3 L     4 Minute Oxygen Saturation %  88 %     4 Minute Liters of Oxygen  3 L     5 Minute Oxygen Saturation %  89 %     5 Minute Liters of Oxygen  3 L     6 Minute Oxygen Saturation %  88 %     6 Minute Liters of Oxygen  3 L     2 Minute Post Oxygen Saturation %  96 %     2 Minute Post Liters of Oxygen  3 L       Oxygen Initial Assessment: Oxygen Initial Assessment - 06/17/17 1523      Home Oxygen   Home  Oxygen Device  E-Tanks she is just now getting home oxygen and needs to be evaluated    Sleep Oxygen Prescription  None    Home Exercise Oxygen Prescription  None    Home at Rest Exercise Oxygen Prescription  None      Initial 6 min Walk   Oxygen Used  Continuous;E-Tanks    Liters per minute  3      Program Oxygen Prescription   Program Oxygen Prescription  Continuous;E-Tanks    Liters per minute  3    Comments  patient is getting home oxygen and is suppose to use 3 liters.       Intervention   Short Term Goals  To learn and exhibit compliance with exercise, home and travel O2 prescription;To learn and understand importance of maintaining oxygen saturations>88%;To learn and demonstrate proper use of respiratory medications;To learn and demonstrate proper pursed lip breathing techniques or other breathing techniques.;To learn and understand importance of monitoring SPO2 with pulse oximeter and demonstrate accurate use of the pulse oximeter. inhaler at home. Albuterol prn    Long  Term Goals  Exhibits compliance with exercise, home and travel O2 prescription;Verbalizes importance of monitoring SPO2 with pulse oximeter and return demonstration;Maintenance of O2 saturations>88%;Exhibits proper breathing techniques, such as pursed lip breathing or other method taught during program session;Compliance with respiratory medication;Demonstrates proper use of MDI's       Oxygen Re-Evaluation:   Oxygen Discharge (Final Oxygen Re-Evaluation):   Initial Exercise Prescription: Initial Exercise Prescription - 06/17/17 1600      Date of Initial Exercise RX and Referring Provider   Date  06/17/17    Referring Provider  Tereasa Coop MD      Oxygen   Oxygen  Continuous    Liters  3-4 4L on treadmill      Treadmill   MPH  1.5    Grade  0.5    Minutes  15    METs  2.25      Recumbant Elliptical   Level  1    RPM  50    Minutes  15    METs  2.1      T5 Nustep   Level  1    SPM  80     Minutes  15    METs  2.1      Prescription Details   Frequency (times per week)  3    Duration  Progress to 45 minutes of aerobic exercise without signs/symptoms of physical distress      Intensity   THRR 40-80% of Max Heartrate  106-143    Ratings of Perceived Exertion  11-13    Perceived Dyspnea  0-4      Progression   Progression  Continue  to progress workloads to maintain intensity without signs/symptoms of physical distress.      Resistance Training   Training Prescription  Yes    Weight  3 lbs    Reps  10-15       Perform Capillary Blood Glucose checks as needed.  Exercise Prescription Changes: Exercise Prescription Changes    Row Name 06/17/17 1600             Response to Exercise   Blood Pressure (Admit)  136/74       Blood Pressure (Exercise)  146/84       Blood Pressure (Exit)  134/76       Heart Rate (Admit)  69 bpm       Heart Rate (Exercise)  127 bpm       Heart Rate (Exit)  75 bpm       Oxygen Saturation (Admit)  93 %       Oxygen Saturation (Exercise)  99 %       Oxygen Saturation (Exit)  96 %       Rating of Perceived Exertion (Exercise)  13       Perceived Dyspnea (Exercise)  2       Symptoms  hip pain 6/10, back pain 3/10, SOB       Comments  walk test results          Exercise Comments:   Exercise Goals and Review: Exercise Goals    Row Name 06/17/17 1628             Exercise Goals   Increase Physical Activity  Yes       Intervention  Provide advice, education, support and counseling about physical activity/exercise needs.;Develop an individualized exercise prescription for aerobic and resistive training based on initial evaluation findings, risk stratification, comorbidities and participant's personal goals.       Expected Outcomes  Long Term: Add in home exercise to make exercise part of routine and to increase amount of physical activity.;Short Term: Attend rehab on a regular basis to increase amount of physical activity.;Long  Term: Exercising regularly at least 3-5 days a week.       Increase Strength and Stamina  Yes       Intervention  Provide advice, education, support and counseling about physical activity/exercise needs.;Develop an individualized exercise prescription for aerobic and resistive training based on initial evaluation findings, risk stratification, comorbidities and participant's personal goals.       Expected Outcomes  Short Term: Increase workloads from initial exercise prescription for resistance, speed, and METs.;Short Term: Perform resistance training exercises routinely during rehab and add in resistance training at home;Long Term: Improve cardiorespiratory fitness, muscular endurance and strength as measured by increased METs and functional capacity (6MWT)       Able to understand and use rate of perceived exertion (RPE) scale  Yes       Intervention  Provide education and explanation on how to use RPE scale       Expected Outcomes  Short Term: Able to use RPE daily in rehab to express subjective intensity level;Long Term:  Able to use RPE to guide intensity level when exercising independently       Able to understand and use Dyspnea scale  Yes       Intervention  Provide education and explanation on how to use Dyspnea scale       Expected Outcomes  Short Term: Able to use Dyspnea scale daily in rehab to express subjective  sense of shortness of breath during exertion;Long Term: Able to use Dyspnea scale to guide intensity level when exercising independently       Knowledge and understanding of Target Heart Rate Range (THRR)  Yes       Intervention  Provide education and explanation of THRR including how the numbers were predicted and where they are located for reference       Expected Outcomes  Short Term: Able to state/look up THRR;Short Term: Able to use daily as guideline for intensity in rehab;Long Term: Able to use THRR to govern intensity when exercising independently       Able to check pulse  independently  Yes       Intervention  Provide education and demonstration on how to check pulse in carotid and radial arteries.;Review the importance of being able to check your own pulse for safety during independent exercise       Expected Outcomes  Short Term: Able to explain why pulse checking is important during independent exercise;Long Term: Able to check pulse independently and accurately       Understanding of Exercise Prescription  Yes       Intervention  Provide education, explanation, and written materials on patient's individual exercise prescription       Expected Outcomes  Short Term: Able to explain program exercise prescription;Long Term: Able to explain home exercise prescription to exercise independently          Exercise Goals Re-Evaluation :   Discharge Exercise Prescription (Final Exercise Prescription Changes): Exercise Prescription Changes - 06/17/17 1600      Response to Exercise   Blood Pressure (Admit)  136/74    Blood Pressure (Exercise)  146/84    Blood Pressure (Exit)  134/76    Heart Rate (Admit)  69 bpm    Heart Rate (Exercise)  127 bpm    Heart Rate (Exit)  75 bpm    Oxygen Saturation (Admit)  93 %    Oxygen Saturation (Exercise)  99 %    Oxygen Saturation (Exit)  96 %    Rating of Perceived Exertion (Exercise)  13    Perceived Dyspnea (Exercise)  2    Symptoms  hip pain 6/10, back pain 3/10, SOB    Comments  walk test results       Nutrition:  Target Goals: Understanding of nutrition guidelines, daily intake of sodium '1500mg'$ , cholesterol '200mg'$ , calories 30% from fat and 7% or less from saturated fats, daily to have 5 or more servings of fruits and vegetables.  Biometrics: Pre Biometrics - 06/17/17 1629      Pre Biometrics   Height  5' 3.5" (1.613 m)    Weight  263 lb 3.2 oz (119.4 kg)    Waist Circumference  46 inches    Hip Circumference  59.25 inches    Waist to Hip Ratio  0.78 %    BMI (Calculated)  45.89        Nutrition  Therapy Plan and Nutrition Goals: Nutrition Therapy & Goals - 06/17/17 1520      Personal Nutrition Goals   Comments  weight loss, eating healthier and learning about healthier options.      Intervention Plan   Intervention  Prescribe, educate and counsel regarding individualized specific dietary modifications aiming towards targeted core components such as weight, hypertension, lipid management, diabetes, heart failure and other comorbidities.;Nutrition handout(s) given to patient.    Expected Outcomes  Short Term Goal: Understand basic principles of dietary content, such  as calories, fat, sodium, cholesterol and nutrients.;Short Term Goal: A plan has been developed with personal nutrition goals set during dietitian appointment.;Long Term Goal: Adherence to prescribed nutrition plan.       Nutrition Assessments: Nutrition Assessments - 06/17/17 1520      MEDFICTS Scores   Pre Score  62       Nutrition Goals Re-Evaluation:   Nutrition Goals Discharge (Final Nutrition Goals Re-Evaluation):   Psychosocial: Target Goals: Acknowledge presence or absence of significant depression and/or stress, maximize coping skills, provide positive support system. Participant is able to verbalize types and ability to use techniques and skills needed for reducing stress and depression.   Initial Review & Psychosocial Screening: Initial Psych Review & Screening - 06/17/17 1517      Initial Review   Current issues with  Current Depression;History of Depression;Current Psychotropic Meds;Current Stress Concerns    Source of Stress Concerns  Chronic Illness;Unable to perform yard/household activities;Family    Comments  Her children do not know that she has emphysema.      Family Dynamics   Good Support System?  Yes    Comments  Her boyfriend of 15 years is a great support system for her.      Barriers   Psychosocial barriers to participate in program  Psychosocial barriers identified (see note);The  patient should benefit from training in stress management and relaxation.      Screening Interventions   Interventions  Encouraged to exercise;Provide feedback about the scores to participant;Program counselor consult;To provide support and resources with identified psychosocial needs    Expected Outcomes  Short Term goal: Utilizing psychosocial counselor, staff and physician to assist with identification of specific Stressors or current issues interfering with healing process. Setting desired goal for each stressor or current issue identified.;Long Term Goal: Stressors or current issues are controlled or eliminated.;Short Term goal: Identification and review with participant of any Quality of Life or Depression concerns found by scoring the questionnaire.;Long Term goal: The participant improves quality of Life and PHQ9 Scores as seen by post scores and/or verbalization of changes       Quality of Life Scores:  Scores of 19 and below usually indicate a poorer quality of life in these areas.  A difference of  2-3 points is a clinically meaningful difference.  A difference of 2-3 points in the total score of the Quality of Life Index has been associated with significant improvement in overall quality of life, self-image, physical symptoms, and general health in studies assessing change in quality of life.  PHQ-9: Recent Review Flowsheet Data    Depression screen [facility] 2/9 06/17/2017   Decreased Interest 1   Down, Depressed, Hopeless 0   PHQ - 2 Score 1   Altered sleeping 0   Tired, decreased energy 2   Change in appetite 1   Feeling bad or failure about yourself  2   Trouble concentrating 0   Moving slowly or fidgety/restless 0   Suicidal thoughts 0   PHQ-9 Score 6   Difficult doing work/chores Somewhat difficult     Interpretation of Total Score  Total Score Depression Severity:  1-4 = Minimal depression, 5-9 = Mild depression, 10-14 = Moderate depression, 15-19 = Moderately severe  depression, 20-27 = Severe depression   Psychosocial Evaluation and Intervention:   Psychosocial Re-Evaluation:   Psychosocial Discharge (Final Psychosocial Re-Evaluation):   Education: Education Goals: Education classes will be provided on a weekly basis, covering required topics. Participant will state understanding/return demonstration  of topics presented.  Learning Barriers/Preferences: Learning Barriers/Preferences - 06/17/17 1523      Learning Barriers/Preferences   Learning Barriers  None    Learning Preferences  None       Education Topics:  Initial Evaluation Education: - Verbal, written and demonstration of respiratory meds, oximetry and breathing techniques. Instruction on use of nebulizers and MDIs and importance of monitoring MDI activations.   Pulmonary Rehab from 06/17/2017 in [facility] Cardiac and Pulmonary Rehab  Date  06/17/17  Educator  [facility]  Instruction Review Code  1- Verbalizes Understanding      General Nutrition Guidelines/Fats and Fiber: -Group instruction provided by verbal, written material, models and posters to present the general guidelines for heart healthy nutrition. Gives an explanation and review of dietary fats and fiber.   Controlling Sodium/Reading Food Labels: -Group verbal and written material supporting the discussion of sodium use in heart healthy nutrition. Review and explanation with models, verbal and written materials for utilization of the food label.   Exercise Physiology & General Exercise Guidelines: - Group verbal and written instruction with models to review the exercise physiology of the cardiovascular system and associated critical values. Provides general exercise guidelines with specific guidelines to those with heart or lung disease.    Aerobic Exercise & Resistance Training: - Gives group verbal and written instruction on the various components of exercise. Focuses on aerobic and resistive training programs and the  benefits of this training and how to safely progress through these programs.   Flexibility, Balance, Mind/Body Relaxation: Provides group verbal/written instruction on the benefits of flexibility and balance training, including mind/body exercise modes such as yoga, pilates and tai chi.  Demonstration and skill practice provided.   Stress and Anxiety: - Provides group verbal and written instruction about the health risks of elevated stress and causes of high stress.  Discuss the correlation between heart/lung disease and anxiety and treatment options. Review healthy ways to manage with stress and anxiety.   Depression: - Provides group verbal and written instruction on the correlation between heart/lung disease and depressed mood, treatment options, and the stigmas associated with seeking treatment.   Exercise & Equipment Safety: - Individual verbal instruction and demonstration of equipment use and safety with use of the equipment.   Pulmonary Rehab from 06/17/2017 in [facility] Cardiac and Pulmonary Rehab  Date  06/17/17  Educator  [facility]  Instruction Review Code  1- Verbalizes Understanding      Infection Prevention: - Provides verbal and written material to individual with discussion of infection control including proper hand washing and proper equipment cleaning during exercise session.   Pulmonary Rehab from 06/17/2017 in [facility] Cardiac and Pulmonary Rehab  Date  06/17/17  Educator  [facility]  Instruction Review Code  1- Verbalizes Understanding      Falls Prevention: - Provides verbal and written material to individual with discussion of falls prevention and safety.   Pulmonary Rehab from 06/17/2017 in [facility] Cardiac and Pulmonary Rehab  Date  06/17/17  Educator  [facility]  Instruction Review Code  1- Verbalizes Understanding      Diabetes: - Individual verbal and written instruction to review signs/symptoms of diabetes, desired ranges of glucose level fasting, after meals and with exercise. Advice  that pre and post exercise glucose checks will be done for 3 sessions at entry of program.   Chronic Lung Diseases: - Group verbal and written instruction to review updates, respiratory medications, advancements in procedures and treatments. Discuss use of supplemental oxygen including available portable  oxygen systems, continuous and intermittent flow rates, concentrators, personal use and safety guidelines. Review proper use of inhaler and spacers. Provide informative websites for self-education.    Energy Conservation: - Provide group verbal and written instruction for methods to conserve energy, plan and organize activities. Instruct on pacing techniques, use of adaptive equipment and posture/positioning to relieve shortness of breath.   Triggers and Exacerbations: - Group verbal and written instruction to review types of environmental triggers and ways to prevent exacerbations. Discuss weather changes, air quality and the benefits of nasal washing. Review warning signs and symptoms to help prevent infections. Discuss techniques for effective airway clearance, coughing, and vibrations.   AED/CPR: - Group verbal and written instruction with the use of models to demonstrate the basic use of the AED with the basic ABC's of resuscitation.   Anatomy and Physiology of the Lungs: - Group verbal and written instruction with the use of models to provide basic lung anatomy and physiology related to function, structure and complications of lung disease.   Anatomy & Physiology of the Heart: - Group verbal and written instruction and models provide basic cardiac anatomy and physiology, with the coronary electrical and arterial systems. Review of Valvular disease and Heart Failure   Cardiac Medications: - Group verbal and written instruction to review commonly prescribed medications for heart disease. Reviews the medication, class of the drug, and side effects.   Know Your Numbers and Risk  Factors: -Group verbal and written instruction about important numbers in your health.  Discussion of what are risk factors and how they play a role in the disease process.  Review of Cholesterol, Blood Pressure, Diabetes, and BMI and the role they play in your overall health.   Sleep Hygiene: -Provides group verbal and written instruction about how sleep can affect your health.  Define sleep hygiene, discuss sleep cycles and impact of sleep habits. Review good sleep hygiene tips.    Other: -Provides group and verbal instruction on various topics (see comments)    Knowledge Questionnaire Score: Knowledge Questionnaire Score - 06/17/17 1523      Knowledge Questionnaire Score   Pre Score  15/18 reviewed with patient        Core Components/Risk Factors/Patient Goals at Admission: Personal Goals and Risk Factors at Admission - 06/17/17 1526      Core Components/Risk Factors/Patient Goals on Admission    Weight Management  Yes;Obesity;Weight Loss    Intervention  Weight Management: Develop a combined nutrition and exercise program designed to reach desired caloric intake, while maintaining appropriate intake of nutrient and fiber, sodium and fats, and appropriate energy expenditure required for the weight goal.;Weight Management: Provide education and appropriate resources to help participant work on and attain dietary goals.;Weight Management/Obesity: Establish reasonable short term and long term weight goals.;Obesity: Provide education and appropriate resources to help participant work on and attain dietary goals.    Admit Weight  263 lb 3.2 oz (119.4 kg)    Goal Weight: Short Term  258 lb (117 kg)    Goal Weight: Long Term  160 lb (72.6 kg)    Expected Outcomes  Short Term: Continue to assess and modify interventions until short term weight is achieved;Long Term: Adherence to nutrition and physical activity/exercise program aimed toward attainment of established weight goal;Weight  Maintenance: Understanding of the daily nutrition guidelines, which includes 25-35% calories from fat, 7% or less cal from saturated fats, less than '200mg'$  cholesterol, less than 1.5gm of sodium, & 5 or more servings of fruits  and vegetables daily;Weight Loss: Understanding of general recommendations for a balanced deficit meal plan, which promotes 1-2 lb weight loss per week and includes a negative energy balance of [phone] kcal/d;Understanding recommendations for meals to include 15-35% energy as protein, 25-35% energy from fat, 35-60% energy from carbohydrates, less than '200mg'$  of dietary cholesterol, 20-35 gm of total fiber daily;Understanding of distribution of calorie intake throughout the day with the consumption of 4-5 meals/snacks    Tobacco Cessation  Yes    Number of packs per day  4 cigarettes a day    Intervention  Assist the participant in steps to quit. Provide individualized education and counseling about committing to Tobacco Cessation, relapse prevention, and pharmacological support that can be provided by physician.;Advice worker, assist with locating and accessing local/national Quit Smoking programs, and support quit date choice.    Expected Outcomes  Short Term: Will demonstrate readiness to quit, by selecting a quit date.;Long Term: Complete abstinence from all tobacco products for at least 12 months from quit date.;Short Term: Will quit all tobacco product use, adhering to prevention of relapse plan.    Improve shortness of breath with ADL's  Yes    Intervention  Provide education, individualized exercise plan and daily activity instruction to help decrease symptoms of SOB with activities of daily living.    Expected Outcomes  Short Term: Improve cardiorespiratory fitness to achieve a reduction of symptoms when performing ADLs;Long Term: Be able to perform more ADLs without symptoms or delay the onset of symptoms    Hypertension  Yes    Intervention  Provide education  on lifestyle modifcations including regular physical activity/exercise, weight management, moderate sodium restriction and increased consumption of fresh fruit, vegetables, and low fat dairy, alcohol moderation, and smoking cessation.;Monitor prescription use compliance.    Expected Outcomes  Short Term: Continued assessment and intervention until BP is < 140/75m HG in hypertensive participants. < 130/875mHG in hypertensive participants with diabetes, heart failure or chronic kidney disease.;Long Term: Maintenance of blood pressure at goal levels.       Core Components/Risk Factors/Patient Goals Review:    Core Components/Risk Factors/Patient Goals at Discharge (Final Review):    ITP Comments: ITP Comments    Row Name 06/17/17 1428           ITP Comments  Medical Evaluation completed. Chart sent for review and changes to Dr. MaEmily Filbertirector of [facility] can be found in [facility]          Comments: Initial ITP

## 2017-06-23 DIAGNOSIS — J439 Emphysema, unspecified: Secondary | ICD-10-CM

## 2017-06-23 NOTE — Progress Notes (Signed)
Pulmonary Individual Treatment Plan  [name] MRN: [id number] Date of Birth: [birth year] Referring Provider:     Pulmonary Rehab from 06/17/2017 in [facility] Cardiac and Pulmonary Rehab  Referring Provider  Tereasa Coop MD      Initial Encounter Date:    Pulmonary Rehab from 06/17/2017 in [facility] Cardiac and Pulmonary Rehab  Date  06/17/17  Referring Provider  Tereasa Coop MD      Visit Diagnosis: Pulmonary emphysema, unspecified emphysema type ([place])  Patient's Home Medications on Admission:  Current Outpatient Medications:  .  albuterol (PROVENTIL HFA;VENTOLIN HFA) 108 (90 Base) MCG/ACT inhaler, Inhale 2 puffs into the lungs every 6 (six) hours as needed for wheezing or shortness of breath., Disp: , Rfl:  .  budesonide-formoterol (SYMBICORT) 160-4.5 MCG/ACT inhaler, Inhale 2 puffs into the lungs 2 (two) times daily., Disp: , Rfl:  .  buprenorphine-naloxone (SUBOXONE) 8-2 MG SUBL SL tablet, Place 1 tablet under the tongue 2 (two) times daily., Disp: , Rfl:  .  citalopram (CELEXA) 40 MG tablet, Take 40 mg by mouth daily., Disp: , Rfl:  .  estradiol (VIVELLE-DOT) 0.0375 MG/24HR, Place 1 patch onto the skin 2 (two) times a week., Disp: , Rfl:  .  fexofenadine (ALLEGRA) 180 MG tablet, Take 180 mg by mouth daily as needed for allergies or rhinitis., Disp: , Rfl:  .  hydrochlorothiazide (HYDRODIURIL) 25 MG tablet, Take 25 mg by mouth daily., Disp: , Rfl:  .  hydrOXYzine (ATARAX/VISTARIL) 25 MG tablet, Take 25 mg by mouth daily as needed for itching., Disp: , Rfl:  .  ibuprofen (ADVIL,MOTRIN) 800 MG tablet, Take 800 mg by mouth 2 (two) times daily as needed., Disp: , Rfl:  .  lamoTRIgine (LAMICTAL) 25 MG tablet, Take 25 mg by mouth 2 (two) times daily., Disp: , Rfl:  .  lisinopril (PRINIVIL,ZESTRIL) 40 MG tablet, Take 40 mg by mouth daily., Disp: , Rfl:  .  LORazepam (ATIVAN) 1 MG tablet, Take 1 mg by mouth 2 (two) times daily as needed for anxiety., Disp: , Rfl:  .   omeprazole (PRILOSEC) 20 MG capsule, Take 20 mg by mouth daily., Disp: , Rfl:  .  progesterone (PROMETRIUM) 100 MG capsule, Take 100 mg by mouth at bedtime., Disp: , Rfl:  .  Secukinumab (COSENTYX 300 DOSE) 150 MG/ML SOSY, Inject into the skin every 30 (thirty) days. Last dose 04/02/16, Disp: , Rfl:  .  spironolactone (ALDACTONE) 25 MG tablet, Take 25 mg by mouth daily., Disp: , Rfl:   Past Medical History: Past Medical History:  Diagnosis Date  . Asthma   . COPD (chronic obstructive pulmonary disease) ([place])   . Degenerative disc disease, cervical   . Depression   . GERD (gastroesophageal reflux disease)   . Hypertension   . Osteoarthritis   . Psoriatic arthritis ([place])   . Wears contact lenses    right eye    Tobacco Use: Social History   Tobacco Use  Smoking Status Current Every Day Smoker  . Packs/day: 0.50  . Years: 25.00  . Pack years: 12.50  . Types: Cigarettes  Smokeless Tobacco Never Used    Labs: Recent Review Flowsheet Data    There is no flowsheet data to display.       Pulmonary Assessment Scores: Pulmonary Assessment Scores    Row Name 06/17/17 1521         ADL UCSD   ADL Phase  Entry     SOB Score total  44  Rest  0     Walk  2     Stairs  4     Bath  2     Dress  1     Shop  3       CAT Score   CAT Score  21       mMRC Score   mMRC Score  3        Pulmonary Function Assessment: Pulmonary Function Assessment - 06/17/17 1522      Breath   Bilateral Breath Sounds  Wheezes    Shortness of Breath  Yes;Fear of Shortness of Breath;Limiting activity       Exercise Target Goals:    Exercise Program Goal: Individual exercise prescription set using results from initial 6 min walk test and THRR while considering  patient's activity barriers and safety.    Exercise Prescription Goal: Initial exercise prescription builds to 30-45 minutes a day of aerobic activity, 2-3 days per week.  Home exercise guidelines will be given to patient  during program as part of exercise prescription that the participant will acknowledge.  Activity Barriers & Risk Stratification: Activity Barriers & Cardiac Risk Stratification - 06/17/17 1623      Activity Barriers & Cardiac Risk Stratification   Activity Barriers  Arthritis;Joint Problems;History of Falls;Muscular Weakness;Deconditioning;Shortness of Breath soriatic arthritis, knee pain       6 Minute Walk: 6 Minute Walk    Row Name 06/17/17 1620         6 Minute Walk   Phase  Initial     Distance  894 feet     Walk Time  6 minutes     # of Rest Breaks  0     MPH  1.69     METS  2.17     RPE  13     Perceived Dyspnea   2     VO2 Peak  7.6     Symptoms  Yes (comment)     Comments  SOB, hip pain 5-6/10, back pain 3/10     Resting HR  69 bpm     Resting BP  136/74     Resting Oxygen Saturation   93 % 88% on Room Air     Exercise Oxygen Saturation  during 6 min walk  88 %     Max Ex. HR  127 bpm     Max Ex. BP  146/84     2 Minute Post BP  134/76       Interval HR   1 Minute HR  86     2 Minute HR  111     3 Minute HR  107     4 Minute HR  127     5 Minute HR  108     6 Minute HR  112     2 Minute Post HR  75     Interval Heart Rate?  Yes       Interval Oxygen   Interval Oxygen?  Yes     Baseline Oxygen Saturation %  93 %     1 Minute Oxygen Saturation %  89 %     1 Minute Liters of Oxygen  3 L     2 Minute Oxygen Saturation %  88 %     2 Minute Liters of Oxygen  3 L     3 Minute Oxygen Saturation %  89 %     3 Minute Liters of  Oxygen  3 L     4 Minute Oxygen Saturation %  88 %     4 Minute Liters of Oxygen  3 L     5 Minute Oxygen Saturation %  89 %     5 Minute Liters of Oxygen  3 L     6 Minute Oxygen Saturation %  88 %     6 Minute Liters of Oxygen  3 L     2 Minute Post Oxygen Saturation %  96 %     2 Minute Post Liters of Oxygen  3 L       Oxygen Initial Assessment: Oxygen Initial Assessment - 06/17/17 1523      Home Oxygen   Home Oxygen Device   E-Tanks she is just now getting home oxygen and needs to be evaluated    Sleep Oxygen Prescription  None    Home Exercise Oxygen Prescription  None    Home at Rest Exercise Oxygen Prescription  None      Initial 6 min Walk   Oxygen Used  Continuous;E-Tanks    Liters per minute  3      Program Oxygen Prescription   Program Oxygen Prescription  Continuous;E-Tanks    Liters per minute  3    Comments  patient is getting home oxygen and is suppose to use 3 liters.       Intervention   Short Term Goals  To learn and exhibit compliance with exercise, home and travel O2 prescription;To learn and understand importance of maintaining oxygen saturations>88%;To learn and demonstrate proper use of respiratory medications;To learn and demonstrate proper pursed lip breathing techniques or other breathing techniques.;To learn and understand importance of monitoring SPO2 with pulse oximeter and demonstrate accurate use of the pulse oximeter. inhaler at home. Albuterol prn    Long  Term Goals  Exhibits compliance with exercise, home and travel O2 prescription;Verbalizes importance of monitoring SPO2 with pulse oximeter and return demonstration;Maintenance of O2 saturations>88%;Exhibits proper breathing techniques, such as pursed lip breathing or other method taught during program session;Compliance with respiratory medication;Demonstrates proper use of MDI's       Oxygen Re-Evaluation:   Oxygen Discharge (Final Oxygen Re-Evaluation):   Initial Exercise Prescription: Initial Exercise Prescription - 06/17/17 1600      Date of Initial Exercise RX and Referring Provider   Date  06/17/17    Referring Provider  Tereasa Coop MD      Oxygen   Oxygen  Continuous    Liters  3-4 4L on treadmill      Treadmill   MPH  1.5    Grade  0.5    Minutes  15    METs  2.25      Recumbant Elliptical   Level  1    RPM  50    Minutes  15    METs  2.1      T5 Nustep   Level  1    SPM  80    Minutes   15    METs  2.1      Prescription Details   Frequency (times per week)  3    Duration  Progress to 45 minutes of aerobic exercise without signs/symptoms of physical distress      Intensity   THRR 40-80% of Max Heartrate  106-143    Ratings of Perceived Exertion  11-13    Perceived Dyspnea  0-4      Progression   Progression  Continue  to progress workloads to maintain intensity without signs/symptoms of physical distress.      Resistance Training   Training Prescription  Yes    Weight  3 lbs    Reps  10-15       Perform Capillary Blood Glucose checks as needed.  Exercise Prescription Changes: Exercise Prescription Changes    Row Name 06/17/17 1600             Response to Exercise   Blood Pressure (Admit)  136/74       Blood Pressure (Exercise)  146/84       Blood Pressure (Exit)  134/76       Heart Rate (Admit)  69 bpm       Heart Rate (Exercise)  127 bpm       Heart Rate (Exit)  75 bpm       Oxygen Saturation (Admit)  93 %       Oxygen Saturation (Exercise)  99 %       Oxygen Saturation (Exit)  96 %       Rating of Perceived Exertion (Exercise)  13       Perceived Dyspnea (Exercise)  2       Symptoms  hip pain 6/10, back pain 3/10, SOB       Comments  walk test results          Exercise Comments:   Exercise Goals and Review: Exercise Goals    Row Name 06/17/17 1628             Exercise Goals   Increase Physical Activity  Yes       Intervention  Provide advice, education, support and counseling about physical activity/exercise needs.;Develop an individualized exercise prescription for aerobic and resistive training based on initial evaluation findings, risk stratification, comorbidities and participant's personal goals.       Expected Outcomes  Long Term: Add in home exercise to make exercise part of routine and to increase amount of physical activity.;Short Term: Attend rehab on a regular basis to increase amount of physical activity.;Long Term:  Exercising regularly at least 3-5 days a week.       Increase Strength and Stamina  Yes       Intervention  Provide advice, education, support and counseling about physical activity/exercise needs.;Develop an individualized exercise prescription for aerobic and resistive training based on initial evaluation findings, risk stratification, comorbidities and participant's personal goals.       Expected Outcomes  Short Term: Increase workloads from initial exercise prescription for resistance, speed, and METs.;Short Term: Perform resistance training exercises routinely during rehab and add in resistance training at home;Long Term: Improve cardiorespiratory fitness, muscular endurance and strength as measured by increased METs and functional capacity (6MWT)       Able to understand and use rate of perceived exertion (RPE) scale  Yes       Intervention  Provide education and explanation on how to use RPE scale       Expected Outcomes  Short Term: Able to use RPE daily in rehab to express subjective intensity level;Long Term:  Able to use RPE to guide intensity level when exercising independently       Able to understand and use Dyspnea scale  Yes       Intervention  Provide education and explanation on how to use Dyspnea scale       Expected Outcomes  Short Term: Able to use Dyspnea scale daily in rehab to express subjective  sense of shortness of breath during exertion;Long Term: Able to use Dyspnea scale to guide intensity level when exercising independently       Knowledge and understanding of Target Heart Rate Range (THRR)  Yes       Intervention  Provide education and explanation of THRR including how the numbers were predicted and where they are located for reference       Expected Outcomes  Short Term: Able to state/look up THRR;Short Term: Able to use daily as guideline for intensity in rehab;Long Term: Able to use THRR to govern intensity when exercising independently       Able to check pulse  independently  Yes       Intervention  Provide education and demonstration on how to check pulse in carotid and radial arteries.;Review the importance of being able to check your own pulse for safety during independent exercise       Expected Outcomes  Short Term: Able to explain why pulse checking is important during independent exercise;Long Term: Able to check pulse independently and accurately       Understanding of Exercise Prescription  Yes       Intervention  Provide education, explanation, and written materials on patient's individual exercise prescription       Expected Outcomes  Short Term: Able to explain program exercise prescription;Long Term: Able to explain home exercise prescription to exercise independently          Exercise Goals Re-Evaluation :   Discharge Exercise Prescription (Final Exercise Prescription Changes): Exercise Prescription Changes - 06/17/17 1600      Response to Exercise   Blood Pressure (Admit)  136/74    Blood Pressure (Exercise)  146/84    Blood Pressure (Exit)  134/76    Heart Rate (Admit)  69 bpm    Heart Rate (Exercise)  127 bpm    Heart Rate (Exit)  75 bpm    Oxygen Saturation (Admit)  93 %    Oxygen Saturation (Exercise)  99 %    Oxygen Saturation (Exit)  96 %    Rating of Perceived Exertion (Exercise)  13    Perceived Dyspnea (Exercise)  2    Symptoms  hip pain 6/10, back pain 3/10, SOB    Comments  walk test results       Nutrition:  Target Goals: Understanding of nutrition guidelines, daily intake of sodium '1500mg'$ , cholesterol '200mg'$ , calories 30% from fat and 7% or less from saturated fats, daily to have 5 or more servings of fruits and vegetables.  Biometrics: Pre Biometrics - 06/17/17 1629      Pre Biometrics   Height  5' 3.5" (1.613 m)    Weight  263 lb 3.2 oz (119.4 kg)    Waist Circumference  46 inches    Hip Circumference  59.25 inches    Waist to Hip Ratio  0.78 %    BMI (Calculated)  45.89        Nutrition  Therapy Plan and Nutrition Goals: Nutrition Therapy & Goals - 06/17/17 1520      Personal Nutrition Goals   Comments  weight loss, eating healthier and learning about healthier options.      Intervention Plan   Intervention  Prescribe, educate and counsel regarding individualized specific dietary modifications aiming towards targeted core components such as weight, hypertension, lipid management, diabetes, heart failure and other comorbidities.;Nutrition handout(s) given to patient.    Expected Outcomes  Short Term Goal: Understand basic principles of dietary content, such  as calories, fat, sodium, cholesterol and nutrients.;Short Term Goal: A plan has been developed with personal nutrition goals set during dietitian appointment.;Long Term Goal: Adherence to prescribed nutrition plan.       Nutrition Assessments: Nutrition Assessments - 06/17/17 1520      MEDFICTS Scores   Pre Score  62       Nutrition Goals Re-Evaluation:   Nutrition Goals Discharge (Final Nutrition Goals Re-Evaluation):   Psychosocial: Target Goals: Acknowledge presence or absence of significant depression and/or stress, maximize coping skills, provide positive support system. Participant is able to verbalize types and ability to use techniques and skills needed for reducing stress and depression.   Initial Review & Psychosocial Screening: Initial Psych Review & Screening - 06/17/17 1517      Initial Review   Current issues with  Current Depression;History of Depression;Current Psychotropic Meds;Current Stress Concerns    Source of Stress Concerns  Chronic Illness;Unable to perform yard/household activities;Family    Comments  Her children do not know that she has emphysema.      Family Dynamics   Good Support System?  Yes    Comments  Her boyfriend of 15 years is a great support system for her.      Barriers   Psychosocial barriers to participate in program  Psychosocial barriers identified (see note);The  patient should benefit from training in stress management and relaxation.      Screening Interventions   Interventions  Encouraged to exercise;Provide feedback about the scores to participant;Program counselor consult;To provide support and resources with identified psychosocial needs    Expected Outcomes  Short Term goal: Utilizing psychosocial counselor, staff and physician to assist with identification of specific Stressors or current issues interfering with healing process. Setting desired goal for each stressor or current issue identified.;Long Term Goal: Stressors or current issues are controlled or eliminated.;Short Term goal: Identification and review with participant of any Quality of Life or Depression concerns found by scoring the questionnaire.;Long Term goal: The participant improves quality of Life and PHQ9 Scores as seen by post scores and/or verbalization of changes       Quality of Life Scores:  Scores of 19 and below usually indicate a poorer quality of life in these areas.  A difference of  2-3 points is a clinically meaningful difference.  A difference of 2-3 points in the total score of the Quality of Life Index has been associated with significant improvement in overall quality of life, self-image, physical symptoms, and general health in studies assessing change in quality of life.  PHQ-9: Recent Review Flowsheet Data    Depression screen [facility] 2/9 06/17/2017   Decreased Interest 1   Down, Depressed, Hopeless 0   PHQ - 2 Score 1   Altered sleeping 0   Tired, decreased energy 2   Change in appetite 1   Feeling bad or failure about yourself  2   Trouble concentrating 0   Moving slowly or fidgety/restless 0   Suicidal thoughts 0   PHQ-9 Score 6   Difficult doing work/chores Somewhat difficult     Interpretation of Total Score  Total Score Depression Severity:  1-4 = Minimal depression, 5-9 = Mild depression, 10-14 = Moderate depression, 15-19 = Moderately severe  depression, 20-27 = Severe depression   Psychosocial Evaluation and Intervention:   Psychosocial Re-Evaluation:   Psychosocial Discharge (Final Psychosocial Re-Evaluation):   Education: Education Goals: Education classes will be provided on a weekly basis, covering required topics. Participant will state understanding/return demonstration  of topics presented.  Learning Barriers/Preferences: Learning Barriers/Preferences - 06/17/17 1523      Learning Barriers/Preferences   Learning Barriers  None    Learning Preferences  None       Education Topics:  Initial Evaluation Education: - Verbal, written and demonstration of respiratory meds, oximetry and breathing techniques. Instruction on use of nebulizers and MDIs and importance of monitoring MDI activations.   Pulmonary Rehab from 06/17/2017 in [facility] Cardiac and Pulmonary Rehab  Date  06/17/17  Educator  [facility]  Instruction Review Code  1- Verbalizes Understanding      General Nutrition Guidelines/Fats and Fiber: -Group instruction provided by verbal, written material, models and posters to present the general guidelines for heart healthy nutrition. Gives an explanation and review of dietary fats and fiber.   Controlling Sodium/Reading Food Labels: -Group verbal and written material supporting the discussion of sodium use in heart healthy nutrition. Review and explanation with models, verbal and written materials for utilization of the food label.   Exercise Physiology & General Exercise Guidelines: - Group verbal and written instruction with models to review the exercise physiology of the cardiovascular system and associated critical values. Provides general exercise guidelines with specific guidelines to those with heart or lung disease.    Aerobic Exercise & Resistance Training: - Gives group verbal and written instruction on the various components of exercise. Focuses on aerobic and resistive training programs and the  benefits of this training and how to safely progress through these programs.   Flexibility, Balance, Mind/Body Relaxation: Provides group verbal/written instruction on the benefits of flexibility and balance training, including mind/body exercise modes such as yoga, pilates and tai chi.  Demonstration and skill practice provided.   Stress and Anxiety: - Provides group verbal and written instruction about the health risks of elevated stress and causes of high stress.  Discuss the correlation between heart/lung disease and anxiety and treatment options. Review healthy ways to manage with stress and anxiety.   Depression: - Provides group verbal and written instruction on the correlation between heart/lung disease and depressed mood, treatment options, and the stigmas associated with seeking treatment.   Exercise & Equipment Safety: - Individual verbal instruction and demonstration of equipment use and safety with use of the equipment.   Pulmonary Rehab from 06/17/2017 in [facility] Cardiac and Pulmonary Rehab  Date  06/17/17  Educator  [facility]  Instruction Review Code  1- Verbalizes Understanding      Infection Prevention: - Provides verbal and written material to individual with discussion of infection control including proper hand washing and proper equipment cleaning during exercise session.   Pulmonary Rehab from 06/17/2017 in [facility] Cardiac and Pulmonary Rehab  Date  06/17/17  Educator  [facility]  Instruction Review Code  1- Verbalizes Understanding      Falls Prevention: - Provides verbal and written material to individual with discussion of falls prevention and safety.   Pulmonary Rehab from 06/17/2017 in [facility] Cardiac and Pulmonary Rehab  Date  06/17/17  Educator  [facility]  Instruction Review Code  1- Verbalizes Understanding      Diabetes: - Individual verbal and written instruction to review signs/symptoms of diabetes, desired ranges of glucose level fasting, after meals and with exercise. Advice  that pre and post exercise glucose checks will be done for 3 sessions at entry of program.   Chronic Lung Diseases: - Group verbal and written instruction to review updates, respiratory medications, advancements in procedures and treatments. Discuss use of supplemental oxygen including available portable  oxygen systems, continuous and intermittent flow rates, concentrators, personal use and safety guidelines. Review proper use of inhaler and spacers. Provide informative websites for self-education.    Energy Conservation: - Provide group verbal and written instruction for methods to conserve energy, plan and organize activities. Instruct on pacing techniques, use of adaptive equipment and posture/positioning to relieve shortness of breath.   Triggers and Exacerbations: - Group verbal and written instruction to review types of environmental triggers and ways to prevent exacerbations. Discuss weather changes, air quality and the benefits of nasal washing. Review warning signs and symptoms to help prevent infections. Discuss techniques for effective airway clearance, coughing, and vibrations.   AED/CPR: - Group verbal and written instruction with the use of models to demonstrate the basic use of the AED with the basic ABC's of resuscitation.   Anatomy and Physiology of the Lungs: - Group verbal and written instruction with the use of models to provide basic lung anatomy and physiology related to function, structure and complications of lung disease.   Anatomy & Physiology of the Heart: - Group verbal and written instruction and models provide basic cardiac anatomy and physiology, with the coronary electrical and arterial systems. Review of Valvular disease and Heart Failure   Cardiac Medications: - Group verbal and written instruction to review commonly prescribed medications for heart disease. Reviews the medication, class of the drug, and side effects.   Know Your Numbers and Risk  Factors: -Group verbal and written instruction about important numbers in your health.  Discussion of what are risk factors and how they play a role in the disease process.  Review of Cholesterol, Blood Pressure, Diabetes, and BMI and the role they play in your overall health.   Sleep Hygiene: -Provides group verbal and written instruction about how sleep can affect your health.  Define sleep hygiene, discuss sleep cycles and impact of sleep habits. Review good sleep hygiene tips.    Other: -Provides group and verbal instruction on various topics (see comments)    Knowledge Questionnaire Score: Knowledge Questionnaire Score - 06/17/17 1523      Knowledge Questionnaire Score   Pre Score  15/18 reviewed with patient        Core Components/Risk Factors/Patient Goals at Admission: Personal Goals and Risk Factors at Admission - 06/17/17 1526      Core Components/Risk Factors/Patient Goals on Admission    Weight Management  Yes;Obesity;Weight Loss    Intervention  Weight Management: Develop a combined nutrition and exercise program designed to reach desired caloric intake, while maintaining appropriate intake of nutrient and fiber, sodium and fats, and appropriate energy expenditure required for the weight goal.;Weight Management: Provide education and appropriate resources to help participant work on and attain dietary goals.;Weight Management/Obesity: Establish reasonable short term and long term weight goals.;Obesity: Provide education and appropriate resources to help participant work on and attain dietary goals.    Admit Weight  263 lb 3.2 oz (119.4 kg)    Goal Weight: Short Term  258 lb (117 kg)    Goal Weight: Long Term  160 lb (72.6 kg)    Expected Outcomes  Short Term: Continue to assess and modify interventions until short term weight is achieved;Long Term: Adherence to nutrition and physical activity/exercise program aimed toward attainment of established weight goal;Weight  Maintenance: Understanding of the daily nutrition guidelines, which includes 25-35% calories from fat, 7% or less cal from saturated fats, less than '200mg'$  cholesterol, less than 1.5gm of sodium, & 5 or more servings of fruits  and vegetables daily;Weight Loss: Understanding of general recommendations for a balanced deficit meal plan, which promotes 1-2 lb weight loss per week and includes a negative energy balance of [phone] kcal/d;Understanding recommendations for meals to include 15-35% energy as protein, 25-35% energy from fat, 35-60% energy from carbohydrates, less than '200mg'$  of dietary cholesterol, 20-35 gm of total fiber daily;Understanding of distribution of calorie intake throughout the day with the consumption of 4-5 meals/snacks    Tobacco Cessation  Yes    Number of packs per day  4 cigarettes a day    Intervention  Assist the participant in steps to quit. Provide individualized education and counseling about committing to Tobacco Cessation, relapse prevention, and pharmacological support that can be provided by physician.;Advice worker, assist with locating and accessing local/national Quit Smoking programs, and support quit date choice.    Expected Outcomes  Short Term: Will demonstrate readiness to quit, by selecting a quit date.;Long Term: Complete abstinence from all tobacco products for at least 12 months from quit date.;Short Term: Will quit all tobacco product use, adhering to prevention of relapse plan.    Improve shortness of breath with ADL's  Yes    Intervention  Provide education, individualized exercise plan and daily activity instruction to help decrease symptoms of SOB with activities of daily living.    Expected Outcomes  Short Term: Improve cardiorespiratory fitness to achieve a reduction of symptoms when performing ADLs;Long Term: Be able to perform more ADLs without symptoms or delay the onset of symptoms    Hypertension  Yes    Intervention  Provide education  on lifestyle modifcations including regular physical activity/exercise, weight management, moderate sodium restriction and increased consumption of fresh fruit, vegetables, and low fat dairy, alcohol moderation, and smoking cessation.;Monitor prescription use compliance.    Expected Outcomes  Short Term: Continued assessment and intervention until BP is < 140/59m HG in hypertensive participants. < 130/828mHG in hypertensive participants with diabetes, heart failure or chronic kidney disease.;Long Term: Maintenance of blood pressure at goal levels.       Core Components/Risk Factors/Patient Goals Review:    Core Components/Risk Factors/Patient Goals at Discharge (Final Review):    ITP Comments: ITP Comments    Row Name 06/17/17 1428 06/23/17 0935         ITP Comments  Medical Evaluation completed. Chart sent for review and changes to Dr. MaEmily Filbertirector of [facility]naDiagnosis can be found in [facility] encounter 05/30/17  30 day review completed. ITP sent to Dr. MaEmily Filbertirector of LuEdgeleyContinue with ITP unless changes are made by physician.         Comments: 30 day review

## 2017-06-25 DIAGNOSIS — J439 Emphysema, unspecified: Secondary | ICD-10-CM

## 2017-06-25 NOTE — Progress Notes (Signed)
Daily Session Note  [name] MRN: [id number] Date of Birth: [birth year] Referring Provider:     Pulmonary Rehab from 06/17/2017 in [facility] Cardiac and Pulmonary Rehab  Referring Provider  Dana Coop MD      Encounter Date: 06/25/2017  Check In: Session Check In - 06/25/17 1123      Check-In   Location  [facility]-Cardiac & Pulmonary Rehab    Staff Present  Dana Sam, MA, RCEP, CCRP, [name];Dana Hill Alcus Dad, RN BSN    Supervising physician immediately available to respond to emergencies  [facility] immediately available ER MD    Physician(s)  Dr. Burlene Hill and Dana Hill    Medication changes reported      No    Fall or balance concerns reported     No    Tobacco Cessation  No Change    Warm-up and Cool-down  Performed as group-led instruction    Resistance Training Performed  Yes    VAD Patient?  No      Pain Assessment   Currently in Pain?  No/denies          Social History   Tobacco Use  Smoking Status Current Every Day Smoker  . Packs/day: 0.50  . Years: 25.00  . Pack years: 12.50  . Types: Cigarettes  Smokeless Tobacco Never Used    Goals Met:  Independence with exercise equipment Exercise tolerated well No report of cardiac concerns or symptoms Strength training completed today  Goals Unmet:  Not Applicable  Comments: First full day of exercise!  Patient was oriented to gym and equipment including functions, settings, policies, and procedures.  Patient's individual exercise prescription and treatment plan were reviewed.  All starting workloads were established based on the results of the 6 minute walk test done at initial orientation visit.  The plan for exercise progression was also introduced and progression will be customized based on patient's performance and goals.   Dr. Emily Hill is Medical Director for [place] and [facility].

## 2017-06-27 DIAGNOSIS — J439 Emphysema, unspecified: Secondary | ICD-10-CM

## 2017-06-27 NOTE — Progress Notes (Signed)
Daily Session Note  [name] MRN: [id number] Date of Birth: 11/18/1960 Referring Provider:     Pulmonary Rehab from 06/17/2017 in [facility] Cardiac and Pulmonary Rehab  Referring Provider  Berendzen, Susan MD      Encounter Date: 06/27/2017  Check In: Session Check In - 06/27/17 1131      Check-In   Location  [facility]-Cardiac & Pulmonary Rehab    Staff Present  Meredith Craven, RN BSN;Joseph Hood RCP,[name],[name];Jessica Hawkins, MA, RCEP, CCRP, [name]    Supervising physician immediately available to respond to emergencies  [facility] immediately available ER MD    Physician(s)  Dr. Quale and Robinson    Medication changes reported      No    Fall or balance concerns reported     No    Tobacco Cessation  No Change    Warm-up and Cool-down  Performed as group-led instruction    Resistance Training Performed  Yes    VAD Patient?  No      Pain Assessment   Currently in Pain?  No/denies          Social History   Tobacco Use  Smoking Status Current Every Day Smoker  . Packs/day: 0.50  . Years: 25.00  . Pack years: 12.50  . Types: Cigarettes  Smokeless Tobacco Never Used    Goals Met:  Independence with exercise equipment Exercise tolerated well No report of cardiac concerns or symptoms Strength training completed today  Goals Unmet:  Not Applicable  Comments: Pt able to follow exercise prescription today without complaint.  Will continue to monitor for progression.   Dr. Mark Miller is Medical Director for [facility] and [facility]. 

## 2017-06-30 DIAGNOSIS — J439 Emphysema, unspecified: Secondary | ICD-10-CM

## 2017-06-30 NOTE — Progress Notes (Signed)
Daily Session Note  Patient Details  Name: Daelynn Blower MRN: [id number] Date of Birth: [birth year] Referring Provider:     Pulmonary Rehab from 06/17/2017 in [facility] Cardiac and Pulmonary Rehab  Referring Provider  Tereasa Coop MD      Encounter Date: 06/30/2017  Check In: Session Check In - 06/30/17 1148      Check-In   Location  [facility]-Cardiac & Pulmonary Rehab    Staff Present  Nada Maclachlan, BA, [name], [name];Kelly Amedeo Plenty, BS, [name], [name];Leondra Cullin Flavia Shipper    Supervising physician immediately available to respond to emergencies  [facility] immediately available ER MD    Physician(s)  Jimmye Norman and Alfred Levins    Medication changes reported      No    Fall or balance concerns reported     No    Tobacco Cessation  No Change    Warm-up and Cool-down  Performed as group-led instruction    Resistance Training Performed  No patient late    VAD Patient?  No      Pain Assessment   Currently in Pain?  No/denies          Social History   Tobacco Use  Smoking Status Current Every Day Smoker  . Packs/day: 0.50  . Years: 25.00  . Pack years: 12.50  . Types: Cigarettes  Smokeless Tobacco Never Used    Goals Met:  Independence with exercise equipment Exercise tolerated well No report of cardiac concerns or symptoms Strength training completed today  Goals Unmet:  Not Applicable  Comments: Pt able to follow exercise prescription today without complaint.  Will continue to monitor for progression.   [name] is Medical Director for [place] and [facility].

## 2017-07-07 DIAGNOSIS — J439 Emphysema, unspecified: Secondary | ICD-10-CM

## 2017-07-07 NOTE — Progress Notes (Signed)
Daily Session Note  [name] MRN: [id number] Date of Birth: 06-May-1961 Referring Provider:     Pulmonary Rehab from 06/17/2017 in [facility] Cardiac and Pulmonary Rehab  Referring Provider  Tereasa Coop MD      Encounter Date: 07/07/2017  Check In: Session Check In - 07/07/17 1145      Check-In   Location  [facility]-Cardiac & Pulmonary Rehab    Staff Present  Earlean Shawl, BS, [name], [name];Amanda Oletta Darter, BA, [name], [name];Giovanie Lefebre Flavia Shipper    Supervising physician immediately available to respond to emergencies  [facility] immediately available ER MD    Physician(s)  Dr. Reita Cliche and Mariea Clonts    Medication changes reported      No    Fall or balance concerns reported     No    Warm-up and Cool-down  Performed as group-led instruction    Resistance Training Performed  Yes    VAD Patient?  No      Pain Assessment   Currently in Pain?  No/denies          Social History   Tobacco Use  Smoking Status Current Every Day Smoker  . Packs/day: 0.50  . Years: 25.00  . Pack years: 12.50  . Types: Cigarettes  Smokeless Tobacco Never Used    Goals Met:  Independence with exercise equipment Exercise tolerated well No report of cardiac concerns or symptoms Strength training completed today  Goals Unmet:  Not Applicable  Comments: Pt able to follow exercise prescription today without complaint.  Will continue to monitor for progression.   [name] is Medical Director for [place] and [facility].

## 2017-07-11 ENCOUNTER — Encounter: Payer: BLUE CROSS/BLUE SHIELD | Attending: Internal Medicine | Admitting: *Deleted

## 2017-07-11 DIAGNOSIS — J439 Emphysema, unspecified: Secondary | ICD-10-CM | POA: Diagnosis present

## 2017-07-11 NOTE — Progress Notes (Signed)
Daily Session Note  [name] MRN: [id number] Date of Birth: Dec 02, 1960 Referring Provider:     Pulmonary Rehab from 06/17/2017 in [facility] Cardiac and Pulmonary Rehab  Referring Provider  Tereasa Coop MD      Encounter Date: 07/11/2017  Check In: Session Check In - 07/11/17 1131      Check-In   Location  [facility]-Cardiac & Pulmonary Rehab    Staff Present  Renita Papa, RN Geralyn Corwin, RN BSN;Jessica Luan [place], [place], RCEP, CCRP, [name]    Supervising physician immediately available to respond to emergencies  [facility] immediately available ER MD    Physician(s)  Drs. Paduchowski and Schaevitz    Medication changes reported      No    Fall or balance concerns reported     No    Tobacco Cessation  No Change    Warm-up and Cool-down  Performed as group-led Higher education careers adviser Performed  Yes    VAD Patient?  No      Pain Assessment   Currently in Pain?  No/denies    Multiple Pain Sites  No          Social History   Tobacco Use  Smoking Status Current Every Day Smoker  . Packs/day: 0.50  . Years: 25.00  . Pack years: 12.50  . Types: Cigarettes  Smokeless Tobacco Never Used    Goals Met:  Proper associated with RPD/PD & O2 Sat Improved SOB with ADL's Using PLB without cueing & demonstrates good technique Exercise tolerated well Strength training completed today  Goals Unmet:  Not Applicable  Comments: Pt able to follow exercise prescription today without complaint.  Will continue to monitor for progression.    [name] is Medical Director for [place] and [facility].

## 2017-07-14 ENCOUNTER — Encounter: Payer: Self-pay | Admitting: *Deleted

## 2017-07-14 ENCOUNTER — Encounter: Payer: BLUE CROSS/BLUE SHIELD | Admitting: *Deleted

## 2017-07-14 DIAGNOSIS — J439 Emphysema, unspecified: Secondary | ICD-10-CM

## 2017-07-14 NOTE — Progress Notes (Signed)
Daily Session Note  [name] MRN: [id number] Date of Birth: [birth year] Referring Provider:     Pulmonary Rehab from 06/17/2017 in [facility] Cardiac and Pulmonary Rehab  Referring Provider  Tereasa Coop MD      Encounter Date: 07/14/2017  Check In: Session Check In - 07/14/17 1142      Check-In   Location  [facility]-Cardiac & Pulmonary Rehab    Staff Present  Earlean Shawl, BS, [name], [name];Amanda Oletta Darter, BA, [name], [name];Joseph Flavia Shipper    Supervising physician immediately available to respond to emergencies  [facility] immediately available ER MD    Physician(s)  Drs. McShane and Williams    Medication changes reported      No    Fall or balance concerns reported     No    Warm-up and Cool-down  Performed as group-led Higher education careers adviser Performed  Yes    VAD Patient?  No      Pain Assessment   Currently in Pain?  No/denies          Social History   Tobacco Use  Smoking Status Current Every Day Smoker  . Packs/day: 0.50  . Years: 25.00  . Pack years: 12.50  . Types: Cigarettes  Smokeless Tobacco Never Used    Goals Met:  Proper associated with RPD/PD & O2 Sat Independence with exercise equipment Using PLB without cueing & demonstrates good technique Exercise tolerated well No report of cardiac concerns or symptoms Strength training completed today  Goals Unmet:  Not Applicable  Comments: Pt able to follow exercise prescription today without complaint.  Will continue to monitor for progression.    [name] is Medical Director for [place] and [facility].

## 2017-07-18 ENCOUNTER — Encounter: Payer: BLUE CROSS/BLUE SHIELD | Admitting: *Deleted

## 2017-07-18 DIAGNOSIS — J439 Emphysema, unspecified: Secondary | ICD-10-CM

## 2017-07-18 NOTE — Progress Notes (Signed)
Daily Session Note  [name] MRN: [id number] Date of Birth: [birth year] Referring Provider:     Pulmonary Rehab from 06/17/2017 in [facility] Cardiac and Pulmonary Rehab  Referring Provider  Tereasa Coop MD      Encounter Date: 07/18/2017  Check In: Session Check In - 07/18/17 1216      Check-In   Location  [facility]-Cardiac & Pulmonary Rehab    Staff Present  Alberteen Sam, MA, RCEP, CCRP, [name];Meredith Hallstead, RN BSN;Mary Kellie Shropshire, RN, BSN, MA    Supervising physician immediately available to respond to emergencies  [facility] immediately available ER MD    Physician(s)  Drs. Lord and [employer]    Medication changes reported      No    Fall or balance concerns reported     No    Tobacco Cessation  No Change    Warm-up and Cool-down  Performed as group-led Higher education careers adviser Performed  Yes    VAD Patient?  No      Pain Assessment   Currently in Pain?  No/denies          Social History   Tobacco Use  Smoking Status Current Every Day Smoker  . Packs/day: 0.25  . Years: 25.00  . Pack years: 6.25  . Types: Cigarettes  Smokeless Tobacco Never Used  Tobacco Comment   given fake cigarette today, she is on wellbutrin and has patches on standby.    Goals Met:  Independence with exercise equipment Exercise tolerated well No report of cardiac concerns or symptoms Strength training completed today  Goals Unmet:  Not Applicable  Comments: Pt able to follow exercise prescription today without complaint.  Will continue to monitor for progression. Reviewed home exercise with pt today.  Pt plans to walk and go to gym in complex for exercise.  She will have access to a treadmill and stairmill. Reviewed THR, pulse, RPE, sign and symptoms, and when to call 911 or MD.  Also discussed weather considerations and indoor options.  Pt voiced understanding.    [name] is Medical Director for [place]  and [facility].

## 2017-07-21 DIAGNOSIS — J439 Emphysema, unspecified: Secondary | ICD-10-CM

## 2017-07-21 NOTE — Progress Notes (Signed)
Pulmonary Individual Treatment Plan  [name] MRN: [id number] Date of Birth: 24-Sep-1960 Referring Provider:     Pulmonary Rehab from 06/17/2017 in [facility]  Referring Provider  Tereasa Coop MD      Initial Encounter Date:    Pulmonary Rehab from 06/17/2017 in [facility]monary Rehab  Date  06/17/17  Referring Provider  Tereasa Coop MD      Visit Diagnosis: Pulmonary emphysema, unspecified emphysema type ([place])  Patient's Home Medications on Admission:  Current Outpatient Medications:  .  albuterol (PROVENTIL HFA;VENTOLIN HFA) 108 (90 Base) MCG/ACT inhaler, Inhale 2 puffs into the lungs every 6 (six) hours as needed for wheezing or shortness of breath., Disp: , Rfl:  .  budesonide-formoterol (SYMBICORT) 160-4.5 MCG/ACT inhaler, Inhale 2 puffs into the lungs 2 (two) times daily., Disp: , Rfl:  .  buprenorphine-naloxone (SUBOXONE) 8-2 MG SUBL SL tablet, Place 1 tablet under the tongue 2 (two) times daily., Disp: , Rfl:  .  citalopram (CELEXA) 40 MG tablet, Take 40 mg by mouth daily., Disp: , Rfl:  .  estradiol (VIVELLE-DOT) 0.0375 MG/24HR, Place 1 patch onto the skin 2 (two) times a week., Disp: , Rfl:  .  fexofenadine (ALLEGRA) 180 MG tablet, Take 180 mg by mouth daily as needed for allergies or rhinitis., Disp: , Rfl:  .  hydrochlorothiazide (HYDRODIURIL) 25 MG tablet, Take 25 mg by mouth daily., Disp: , Rfl:  .  hydrOXYzine (ATARAX/VISTARIL) 25 MG tablet, Take 25 mg by mouth daily as needed for itching., Disp: , Rfl:  .  ibuprofen (ADVIL,MOTRIN) 800 MG tablet, Take 800 mg by mouth 2 (two) times daily as needed., Disp: , Rfl:  .  lamoTRIgine (LAMICTAL) 25 MG tablet, Take 25 mg by mouth 2 (two) times daily., Disp: , Rfl:  .  lisinopril (PRINIVIL,ZESTRIL) 40 MG tablet, Take 40 mg by mouth daily., Disp: , Rfl:  .  LORazepam (ATIVAN) 1 MG tablet, Take 1 mg by mouth 2 (two) times daily as needed for anxiety., Disp: , Rfl:  .   omeprazole (PRILOSEC) 20 MG capsule, Take 20 mg by mouth daily., Disp: , Rfl:  .  progesterone (PROMETRIUM) 100 MG capsule, Take 100 mg by mouth at bedtime., Disp: , Rfl:  .  Secukinumab (COSENTYX 300 DOSE) 150 MG/ML SOSY, Inject into the skin every 30 (thirty) days. Last dose 04/02/16, Disp: , Rfl:  .  spironolactone (ALDACTONE) 25 MG tablet, Take 25 mg by mouth daily., Disp: , Rfl:   Past Medical History: Past Medical History:  Diagnosis Date  . Asthma   . COPD (chronic obstructive pulmonary disease) ([place])   . Degenerative disc disease, cervical   . Depression   . GERD (gastroesophageal reflux disease)   . Hypertension   . Osteoarthritis   . Psoriatic arthritis ([place])   . Wears contact lenses    right eye    Tobacco Use: Social History   Tobacco Use  Smoking Status Current Every Day Smoker  . Packs/day: 0.25  . Years: 25.00  . Pack years: 6.25  . Types: Cigarettes  Smokeless Tobacco Never Used  Tobacco Comment   given fake cigarette today, she is on wellbutrin and has patches on standby.    Labs: Recent Review Flowsheet Data    There is no flowsheet data to display.       Pulmonary Assessment Scores: Pulmonary Assessment Scores    Row Name 06/17/17 [id number]  ADL UCSD   ADL Phase  Entry     SOB Score total  44     Rest  0     Walk  2     Stairs  4     Bath  2     Dress  1     Shop  3       CAT Score   CAT Score  21       mMRC Score   mMRC Score  3        Pulmonary Function Assessment: Pulmonary Function Assessment - 06/17/17 1522      Breath   Bilateral Breath Sounds  Wheezes    Shortness of Breath  Yes;Fear of Shortness of Breath;Limiting activity       Exercise Target Goals:    Exercise Program Goal: Individual exercise prescription set using results from initial 6 min walk test and THRR while considering  patient's activity barriers and safety.    Exercise Prescription Goal: Initial exercise prescription builds to 30-45 minutes a  day of aerobic activity, 2-3 days per week.  Home exercise guidelines will be given to patient during program as part of exercise prescription that the participant will acknowledge.  Activity Barriers & Risk Stratification: Activity Barriers & Cardiac Risk Stratification - 06/17/17 1623      Activity Barriers & Cardiac Risk Stratification   Activity Barriers  Arthritis;Joint Problems;History of Falls;Muscular Weakness;Deconditioning;Shortness of Breath soriatic arthritis, knee pain       6 Minute Walk: 6 Minute Walk    Row Name 06/17/17 1620         6 Minute Walk   Phase  Initial     Distance  894 feet     Walk Time  6 minutes     # of Rest Breaks  0     MPH  1.69     METS  2.17     RPE  13     Perceived Dyspnea   2     VO2 Peak  7.6     Symptoms  Yes (comment)     Comments  SOB, hip pain 5-6/10, back pain 3/10     Resting HR  69 bpm     Resting BP  136/74     Resting Oxygen Saturation   93 % 88% on Room Air     Exercise Oxygen Saturation  during 6 min walk  88 %     Max Ex. HR  127 bpm     Max Ex. BP  146/84     2 Minute Post BP  134/76       Interval HR   1 Minute HR  86     2 Minute HR  111     3 Minute HR  107     4 Minute HR  127     5 Minute HR  108     6 Minute HR  112     2 Minute Post HR  75     Interval Heart Rate?  Yes       Interval Oxygen   Interval Oxygen?  Yes     Baseline Oxygen Saturation %  93 %     1 Minute Oxygen Saturation %  89 %     1 Minute Liters of Oxygen  3 L     2 Minute Oxygen Saturation %  88 %     2 Minute Liters of Oxygen  3  L     3 Minute Oxygen Saturation %  89 %     3 Minute Liters of Oxygen  3 L     4 Minute Oxygen Saturation %  88 %     4 Minute Liters of Oxygen  3 L     5 Minute Oxygen Saturation %  89 %     5 Minute Liters of Oxygen  3 L     6 Minute Oxygen Saturation %  88 %     6 Minute Liters of Oxygen  3 L     2 Minute Post Oxygen Saturation %  96 %     2 Minute Post Liters of Oxygen  3 L       Oxygen Initial  Assessment: Oxygen Initial Assessment - 06/17/17 1523      Home Oxygen   Home Oxygen Device  E-Tanks she is just now getting home oxygen and needs to be evaluated    Sleep Oxygen Prescription  None    Home Exercise Oxygen Prescription  None    Home at Rest Exercise Oxygen Prescription  None      Initial 6 min Walk   Oxygen Used  Continuous;E-Tanks    Liters per minute  3      Program Oxygen Prescription   Program Oxygen Prescription  Continuous;E-Tanks    Liters per minute  3    Comments  patient is getting home oxygen and is suppose to use 3 liters.       Intervention   Short Term Goals  To learn and exhibit compliance with exercise, home and travel O2 prescription;To learn and understand importance of maintaining oxygen saturations>88%;To learn and demonstrate proper use of respiratory medications;To learn and demonstrate proper pursed lip breathing techniques or other breathing techniques.;To learn and understand importance of monitoring SPO2 with pulse oximeter and demonstrate accurate use of the pulse oximeter. inhaler at home. Albuterol prn    Long  Term Goals  Exhibits compliance with exercise, home and travel O2 prescription;Verbalizes importance of monitoring SPO2 with pulse oximeter and return demonstration;Maintenance of O2 saturations>88%;Exhibits proper breathing techniques, such as pursed lip breathing or other method taught during program session;Compliance with respiratory medication;Demonstrates proper use of MDI's       Oxygen Re-Evaluation: Oxygen Re-Evaluation    Row Name 06/25/17 1125             Program Oxygen Prescription   Program Oxygen Prescription  Continuous;E-Tanks       Liters per minute  3       Comments  patient is getting home oxygen and is suppose to use 3 liters.          Home Oxygen   Home Oxygen Device  E-Tanks       Home Exercise Oxygen Prescription  None       Home at Rest Exercise Oxygen Prescription  None       Compliance with Home  Oxygen Use  Yes         Goals/Expected Outcomes   Short Term Goals  To learn and exhibit compliance with exercise, home and travel O2 prescription;To learn and understand importance of maintaining oxygen saturations>88%;To learn and demonstrate proper use of respiratory medications;To learn and demonstrate proper pursed lip breathing techniques or other breathing techniques.;To learn and understand importance of monitoring SPO2 with pulse oximeter and demonstrate accurate use of the pulse oximeter.       Long  Term Goals  Exhibits compliance with  exercise, home and travel O2 prescription;Verbalizes importance of monitoring SPO2 with pulse oximeter and return demonstration;Maintenance of O2 saturations>88%;Exhibits proper breathing techniques, such as pursed lip breathing or other method taught during program session;Compliance with respiratory medication;Demonstrates proper use of MDI's       Comments  Reviewed PLB technique with pt.  Talked about how it work and it's important to maintaining his exercise saturations.         Goals/Expected Outcomes  Short: Become more profiecient at using PLB.   Long: Become independent at using PLB.          Oxygen Discharge (Final Oxygen Re-Evaluation): Oxygen Re-Evaluation - 06/25/17 1125      Program Oxygen Prescription   Program Oxygen Prescription  Continuous;E-Tanks    Liters per minute  3    Comments  patient is getting home oxygen and is suppose to use 3 liters.       Home Oxygen   Home Oxygen Device  E-Tanks    Home Exercise Oxygen Prescription  None    Home at Rest Exercise Oxygen Prescription  None    Compliance with Home Oxygen Use  Yes      Goals/Expected Outcomes   Short Term Goals  To learn and exhibit compliance with exercise, home and travel O2 prescription;To learn and understand importance of maintaining oxygen saturations>88%;To learn and demonstrate proper use of respiratory medications;To learn and demonstrate proper pursed lip  breathing techniques or other breathing techniques.;To learn and understand importance of monitoring SPO2 with pulse oximeter and demonstrate accurate use of the pulse oximeter.    Long  Term Goals  Exhibits compliance with exercise, home and travel O2 prescription;Verbalizes importance of monitoring SPO2 with pulse oximeter and return demonstration;Maintenance of O2 saturations>88%;Exhibits proper breathing techniques, such as pursed lip breathing or other method taught during program session;Compliance with respiratory medication;Demonstrates proper use of MDI's    Comments  Reviewed PLB technique with pt.  Talked about how it work and it's important to maintaining his exercise saturations.      Goals/Expected Outcomes  Short: Become more profiecient at using PLB.   Long: Become independent at using PLB.       Initial Exercise Prescription: Initial Exercise Prescription - 06/17/17 1600      Date of Initial Exercise RX and Referring Provider   Date  06/17/17    Referring Provider  Tereasa Coop MD      Oxygen   Oxygen  Continuous    Liters  3-4 4L on treadmill      Treadmill   MPH  1.5    Grade  0.5    Minutes  15    METs  2.25      Recumbant Elliptical   Level  1    RPM  50    Minutes  15    METs  2.1      T5 Nustep   Level  1    SPM  80    Minutes  15    METs  2.1      Prescription Details   Frequency (times per week)  3    Duration  Progress to 45 minutes of aerobic exercise without signs/symptoms of physical distress      Intensity   THRR 40-80% of Max Heartrate  106-143    Ratings of Perceived Exertion  11-13    Perceived Dyspnea  0-4      Progression   Progression  Continue to progress workloads to maintain intensity  without signs/symptoms of physical distress.      Resistance Training   Training Prescription  Yes    Weight  3 lbs    Reps  10-15       Perform Capillary Blood Glucose checks as needed.  Exercise Prescription Changes: Exercise  Prescription Changes    Row Name 06/17/17 1600 07/07/17 1600 07/18/17 1200         Response to Exercise   Blood Pressure (Admit)  136/74  130/70  -     Blood Pressure (Exercise)  146/84  -  -     Blood Pressure (Exit)  134/76  128/78  -     Heart Rate (Admit)  69 bpm  81 bpm  -     Heart Rate (Exercise)  127 bpm  96 bpm  -     Heart Rate (Exit)  75 bpm  73 bpm  -     Oxygen Saturation (Admit)  93 %  88 %  -     Oxygen Saturation (Exercise)  99 %  88 %  -     Oxygen Saturation (Exit)  96 %  95 %  -     Rating of Perceived Exertion (Exercise)  13  13  -     Perceived Dyspnea (Exercise)  2  1  -     Symptoms  hip pain 6/10, back pain 3/10, SOB  none  -     Comments  walk test results  -  -     Duration  -  Continue with 45 min of aerobic exercise without signs/symptoms of physical distress.  -     Intensity  -  THRR unchanged  -       Progression   Progression  -  Continue to progress workloads to maintain intensity without signs/symptoms of physical distress.  -     Average METs  -  2.73  -       Resistance Training   Training Prescription  -  Yes  -     Weight  -  3 lbs  -     Reps  -  10-15  -       Interval Training   Interval Training  -  No  -       Oxygen   Oxygen  -  Continuous  -     Liters  -  2-3 3L on treadmill  -       Treadmill   MPH  -  1.5  -     Grade  -  0.5  -     Minutes  -  15  -     METs  -  2.25  -       Recumbant Elliptical   Level  -  1  -     RPM  -  39  -     Minutes  -  15  -     METs  -  1.3  -       T5 Nustep   Level  -  1  -     SPM  -  100  -     Minutes  -  15  -     METs  -  1.9  -       Home Exercise Plan   Plans to continue exercise at  -  -  Home (comment) gym in complex, treadmill, stairmill, walking  Frequency  -  -  Add 1 additional day to program exercise sessions.     Initial Home Exercises Provided  -  -  07/18/17        Exercise Comments: Exercise Comments    Row Name 06/25/17 1124           Exercise  Comments  First full day of exercise!  Patient was oriented to gym and equipment including functions, settings, policies, and procedures.  Patient's individual exercise prescription and treatment plan were reviewed.  All starting workloads were established based on the results of the 6 minute walk test done at initial orientation visit.  The plan for exercise progression was also introduced and progression will be customized based on patient's performance and goals.          Exercise Goals and Review: Exercise Goals    Row Name 06/17/17 1628             Exercise Goals   Increase Physical Activity  Yes       Intervention  Provide advice, education, support and counseling about physical activity/exercise needs.;Develop an individualized exercise prescription for aerobic and resistive training based on initial evaluation findings, risk stratification, comorbidities and participant's personal goals.       Expected Outcomes  Long Term: Add in home exercise to make exercise part of routine and to increase amount of physical activity.;Short Term: Attend rehab on a regular basis to increase amount of physical activity.;Long Term: Exercising regularly at least 3-5 days a week.       Increase Strength and Stamina  Yes       Intervention  Provide advice, education, support and counseling about physical activity/exercise needs.;Develop an individualized exercise prescription for aerobic and resistive training based on initial evaluation findings, risk stratification, comorbidities and participant's personal goals.       Expected Outcomes  Short Term: Increase workloads from initial exercise prescription for resistance, speed, and METs.;Short Term: Perform resistance training exercises routinely during rehab and add in resistance training at home;Long Term: Improve cardiorespiratory fitness, muscular endurance and strength as measured by increased METs and functional capacity (6MWT)       Able to understand  and use rate of perceived exertion (RPE) scale  Yes       Intervention  Provide education and explanation on how to use RPE scale       Expected Outcomes  Short Term: Able to use RPE daily in rehab to express subjective intensity level;Long Term:  Able to use RPE to guide intensity level when exercising independently       Able to understand and use Dyspnea scale  Yes       Intervention  Provide education and explanation on how to use Dyspnea scale       Expected Outcomes  Short Term: Able to use Dyspnea scale daily in rehab to express subjective sense of shortness of breath during exertion;Long Term: Able to use Dyspnea scale to guide intensity level when exercising independently       Knowledge and understanding of Target Heart Rate Range (THRR)  Yes       Intervention  Provide education and explanation of THRR including how the numbers were predicted and where they are located for reference       Expected Outcomes  Short Term: Able to state/look up THRR;Short Term: Able to use daily as guideline for intensity in rehab;Long Term: Able to use THRR to govern intensity when exercising independently  Able to check pulse independently  Yes       Intervention  Provide education and demonstration on how to check pulse in carotid and radial arteries.;Review the importance of being able to check your own pulse for safety during independent exercise       Expected Outcomes  Short Term: Able to explain why pulse checking is important during independent exercise;Long Term: Able to check pulse independently and accurately       Understanding of Exercise Prescription  Yes       Intervention  Provide education, explanation, and written materials on patient's individual exercise prescription       Expected Outcomes  Short Term: Able to explain program exercise prescription;Long Term: Able to explain home exercise prescription to exercise independently          Exercise Goals Re-Evaluation : Exercise Goals  Re-Evaluation    Row Name 06/25/17 1124 07/07/17 1621 07/18/17 1219         Exercise Goal Re-Evaluation   Exercise Goals Review  Understanding of Exercise Prescription;Able to understand and use Dyspnea scale;Knowledge and understanding of Target Heart Rate Range (THRR);Able to understand and use rate of perceived exertion (RPE) scale  Increase Physical Activity;Understanding of Exercise Prescription;Increase Strength and Stamina  Increase Physical Activity;Understanding of Exercise Prescription;Increase Strength and Stamina;Able to understand and use Dyspnea scale;Knowledge and understanding of Target Heart Rate Range (THRR);Able to understand and use rate of perceived exertion (RPE) scale;Able to check pulse independently     Comments  Reviewed RPE scale, THR and program prescription with pt today.  Pt voiced understanding and was given a copy of goals to take home.   Miyako has been doing well in rehab.  She is now using 3L on the treadmill to help improve her saturations.  We will continue to monitor her progress.   Reviewed home exercise with pt today.  Pt plans to walk and go to gym in complex for exercise.  She will have access to a treadmill and stairmill. Reviewed THR, pulse, RPE, sign and symptoms, and when to call 911 or MD.  Also discussed weather considerations and indoor options.  Pt voiced understanding.     Expected Outcomes  Short: Use RPE daily to regulate intensity.  Long: Follow program prescription in THR.  Short: Continue to attend regularly.  Long: Continue to work on IT sales professional.   Short: Start going to gym 1 extra day a week.  Long: Exercise more on off days from rehab.         Discharge Exercise Prescription (Final Exercise Prescription Changes): Exercise Prescription Changes - 07/18/17 1200      Home Exercise Plan   Plans to continue exercise at  Home (comment) gym in complex, treadmill, stairmill, walking    Frequency  Add 1 additional day to program exercise  sessions.    Initial Home Exercises Provided  07/18/17       Nutrition:  Target Goals: Understanding of nutrition guidelines, daily intake of sodium '1500mg'$ , cholesterol '200mg'$ , calories 30% from fat and 7% or less from saturated fats, daily to have 5 or more servings of fruits and vegetables.  Biometrics: Pre Biometrics - 06/17/17 1629      Pre Biometrics   Height  5' 3.5" (1.613 m)    Weight  263 lb 3.2 oz (119.4 kg)    Waist Circumference  46 inches    Hip Circumference  59.25 inches    Waist to Hip Ratio  0.78 %  BMI (Calculated)  45.89        Nutrition Therapy Plan and Nutrition Goals: Nutrition Therapy & Goals - 06/17/17 1520      Personal Nutrition Goals   Comments  weight loss, eating healthier and learning about healthier options.      Intervention Plan   Intervention  Prescribe, educate and counsel regarding individualized specific dietary modifications aiming towards targeted core components such as weight, hypertension, lipid management, diabetes, heart failure and other comorbidities.;Nutrition handout(s) given to patient.    Expected Outcomes  Short Term Goal: Understand basic principles of dietary content, such as calories, fat, sodium, cholesterol and nutrients.;Short Term Goal: A plan has been developed with personal nutrition goals set during dietitian appointment.;Long Term Goal: Adherence to prescribed nutrition plan.       Nutrition Assessments: Nutrition Assessments - 06/17/17 1520      MEDFICTS Scores   Pre Score  62       Nutrition Goals Re-Evaluation: Nutrition Goals Re-Evaluation    Row Name 07/14/17 1157             Goals   Current Weight  264 lb (119.7 kg)       Comment  Jameah will schedule with RD and folow her recommendations       Expected Outcome  Short - Brendaly will meet with RD  Long - Korrine will continue to lose weight          Nutrition Goals Discharge (Final Nutrition Goals Re-Evaluation): Nutrition Goals Re-Evaluation -  07/14/17 1157      Goals   Current Weight  264 lb (119.7 kg)    Comment  Ronnae will schedule with RD and folow her recommendations    Expected Outcome  Short - Odell will meet with RD  Long - Shemekia will continue to lose weight       Psychosocial: Target Goals: Acknowledge presence or absence of significant depression and/or stress, maximize coping skills, provide positive support system. Participant is able to verbalize types and ability to use techniques and skills needed for reducing stress and depression.   Initial Review & Psychosocial Screening: Initial Psych Review & Screening - 06/17/17 1517      Initial Review   Current issues with  Current Depression;History of Depression;Current Psychotropic Meds;Current Stress Concerns    Source of Stress Concerns  Chronic Illness;Unable to perform yard/household activities;Family    Comments  Her children do not know that she has emphysema.      Family Dynamics   Good Support System?  Yes    Comments  Her boyfriend of 15 years is a great support system for her.      Barriers   Psychosocial barriers to participate in program  Psychosocial barriers identified (see note);The patient should benefit from training in stress management and relaxation.      Screening Interventions   Interventions  Encouraged to exercise;Provide feedback about the scores to participant;Program counselor consult;To provide support and resources with identified psychosocial needs    Expected Outcomes  Short Term goal: Utilizing psychosocial counselor, staff and physician to assist with identification of specific Stressors or current issues interfering with healing process. Setting desired goal for each stressor or current issue identified.;Long Term Goal: Stressors or current issues are controlled or eliminated.;Short Term goal: Identification and review with participant of any Quality of Life or Depression concerns found by scoring the questionnaire.;Long Term goal:  The participant improves quality of Life and PHQ9 Scores as seen by post scores and/or  verbalization of changes       Quality of Life Scores:  Scores of 19 and below usually indicate a poorer quality of life in these areas.  A difference of  2-3 points is a clinically meaningful difference.  A difference of 2-3 points in the total score of the Quality of Life Index has been associated with significant improvement in overall quality of life, self-image, physical symptoms, and general health in studies assessing change in quality of life.  PHQ-9: Recent Review Flowsheet Data    Depression screen [facility] 2/9 06/17/2017   Decreased Interest 1   Down, Depressed, Hopeless 0   PHQ - 2 Score 1   Altered sleeping 0   Tired, decreased energy 2   Change in appetite 1   Feeling bad or failure about yourself  2   Trouble concentrating 0   Moving slowly or fidgety/restless 0   Suicidal thoughts 0   PHQ-9 Score 6   Difficult doing work/chores Somewhat difficult     Interpretation of Total Score  Total Score Depression Severity:  1-4 = Minimal depression, 5-9 = Mild depression, 10-14 = Moderate depression, 15-19 = Moderately severe depression, 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 Nyoka Cowden Shirlean Mylar) today for initial psychosocial evaluation.  She is a 57 year old who struggles with COPD.  Keilly has a strong support system with a significant other of 13 years; and (2) sisters and a brother who are local and involved.  Paislyn reports sleeping "okay" other than being in pain with her arthritis.  Others report she is very "restless" at night and her arms are moving often.  She has had several sleep studies and it has been  determined she does not have sleep apnea.  Ladawn reports a history of depression and anxiety and has been on medications for this for quite some time.  She reports these are helping and her mood is stable at this time; other than sadness over the recent loss of her 51 year old dog.  Shermeka has goals to breathe and feel better over all; to lose weight and to generally have a better quality of life.  Staff will follow with Ashari throughout the course of this program.      Expected Outcomes  Lexandra will benefit from consistent exercise to achieve her stated goals.  She will meet with the dietician to address her weight loss goal.  Sherilee will also benefit from the educational and psychoeducational components of this program to learn more about her condition and discover more positive ways of coping in general.      Continue Psychosocial Services   Follow up required by staff       Psychosocial Re-Evaluation: Psychosocial Re-Evaluation    [place] Name 07/14/17 1158             Psychosocial Re-Evaluation   Current issues with  Current Stress Concerns       Comments  Sheniqua is still stressed from losing her dog and other family issues - nephew and sister.       Expected Outcomes  Short - Aashika is looking for another dog Long - Ferrell will continue to exercise to get out of the house and deal with stress  Interventions  Encouraged to attend Pulmonary Rehabilitation for the exercise       Continue Psychosocial Services   Follow up required by staff          Psychosocial Discharge (Final Psychosocial Re-Evaluation): Psychosocial Re-Evaluation - 07/14/17 1158      Psychosocial Re-Evaluation   Current issues with  Current Stress Concerns    Comments  Myrl is still stressed from losing her dog and other family issues - nephew and sister.    Expected Outcomes  Short - Laikyn is looking for another dog Long - Krithi will continue to exercise to get out of the house and deal with stress    Interventions   Encouraged to attend Pulmonary Rehabilitation for the exercise    Continue Psychosocial Services   Follow up required by staff       Education: Education Goals: Education classes will be provided on a weekly basis, covering required topics. Participant will state understanding/return demonstration of topics presented.  Learning Barriers/Preferences: Learning Barriers/Preferences - 06/17/17 1523      Learning Barriers/Preferences   Learning Barriers  None    Learning Preferences  None       Education Topics:  Initial Evaluation Education: - Verbal, written and demonstration of respiratory meds, oximetry and breathing techniques. Instruction on use of nebulizers and MDIs and importance of monitoring MDI activations.   Pulmonary Rehab from 07/11/2017 in [facility] Cardiac and Pulmonary Rehab  Date  06/17/17  Educator  [facility]  Instruction Review Code  1- Verbalizes Understanding      General Nutrition Guidelines/Fats and Fiber: -Group instruction provided by verbal, written material, models and posters to present the general guidelines for heart healthy nutrition. Gives an explanation and review of dietary fats and fiber.   Controlling Sodium/Reading Food Labels: -Group verbal and written material supporting the discussion of sodium use in heart healthy nutrition. Review and explanation with models, verbal and written materials for utilization of the food label.   Exercise Physiology & General Exercise Guidelines: - Group verbal and written instruction with models to review the exercise physiology of the cardiovascular system and associated critical values. Provides general exercise guidelines with specific guidelines to those with heart or lung disease.    Aerobic Exercise & Resistance Training: - Gives group verbal and written instruction on the various components of exercise. Focuses on aerobic and resistive training programs and the benefits of this training and how to safely progress  through these programs.   Flexibility, Balance, Mind/Body Relaxation: Provides group verbal/written instruction on the benefits of flexibility and balance training, including mind/body exercise modes such as yoga, pilates and tai chi.  Demonstration and skill practice provided.   Pulmonary Rehab from 07/11/2017 in [facility] Cardiac and Pulmonary Rehab  Date  06/25/17  Educator  [facility]  Instruction Review Code  1- Verbalizes Understanding      Stress and Anxiety: - Provides group verbal and written instruction about the health risks of elevated stress and causes of high stress.  Discuss the correlation between heart/lung disease and anxiety and treatment options. Review healthy ways to manage with stress and anxiety.   Depression: - Provides group verbal and written instruction on the correlation between heart/lung disease and depressed mood, treatment options, and the stigmas associated with seeking treatment.   Exercise & Equipment Safety: - Individual verbal instruction and demonstration of equipment use and safety with use of the equipment.   Pulmonary Rehab from 07/11/2017 in [facility] Cardiac and Pulmonary Rehab  Date  06/17/17  Educator  [facility]  Instruction Review Code  1- Verbalizes Understanding      Infection Prevention: - Provides verbal and written material to individual with discussion of infection control including proper hand washing and proper equipment cleaning during exercise session.   Pulmonary Rehab from 07/11/2017 in [facility] Cardiac and Pulmonary Rehab  Date  06/17/17  Educator  [facility]  Instruction Review Code  1- Verbalizes Understanding      Falls Prevention: - Provides verbal and written material to individual with discussion of falls prevention and safety.   Pulmonary Rehab from 07/11/2017 in [facility] Cardiac and Pulmonary Rehab  Date  06/17/17  Educator  [facility]  Instruction Review Code  1- Verbalizes Understanding      Diabetes: - Individual verbal and written instruction to review  signs/symptoms of diabetes, desired ranges of glucose level fasting, after meals and with exercise. Advice that pre and post exercise glucose checks will be done for 3 sessions at entry of program.   Chronic Lung Diseases: - Group verbal and written instruction to review updates, respiratory medications, advancements in procedures and treatments. Discuss use of supplemental oxygen including available portable oxygen systems, continuous and intermittent flow rates, concentrators, personal use and safety guidelines. Review proper use of inhaler and spacers. Provide informative websites for self-education.    Energy Conservation: - Provide group verbal and written instruction for methods to conserve energy, plan and organize activities. Instruct on pacing techniques, use of adaptive equipment and posture/positioning to relieve shortness of breath.   Triggers and Exacerbations: - Group verbal and written instruction to review types of environmental triggers and ways to prevent exacerbations. Discuss weather changes, air quality and the benefits of nasal washing. Review warning signs and symptoms to help prevent infections. Discuss techniques for effective airway clearance, coughing, and vibrations.   AED/CPR: - Group verbal and written instruction with the use of models to demonstrate the basic use of the AED with the basic ABC's of resuscitation.   Pulmonary Rehab from 07/11/2017 in [facility] Cardiac and Pulmonary Rehab  Date  07/11/17  Educator  [facility]  Instruction Review Code  1- Actuary and Physiology of the Lungs: - Group verbal and written instruction with the use of models to provide basic lung anatomy and physiology related to function, structure and complications of lung disease.   Anatomy & Physiology of the Heart: - Group verbal and written instruction and models provide basic cardiac anatomy and physiology, with the coronary electrical and arterial systems.  Review of Valvular disease and Heart Failure   Cardiac Medications: - Group verbal and written instruction to review commonly prescribed medications for heart disease. Reviews the medication, class of the drug, and side effects.   Pulmonary Rehab from 07/11/2017 in [facility] Cardiac and Pulmonary Rehab  Date  06/27/17  Educator  [facility]  Instruction Review Code  1- Verbalizes Understanding      Know Your Numbers and Risk Factors: -Group verbal and written instruction about important numbers in your health.  Discussion of what are risk factors and how they play a role in the disease process.  Review of Cholesterol, Blood Pressure, Diabetes, and BMI and the role they play in your overall health.   Sleep Hygiene: -Provides group verbal and written instruction about how sleep can affect your health.  Define sleep hygiene, discuss sleep cycles and impact of sleep habits. Review good sleep hygiene tips.    Other: -Provides group and verbal instruction on various topics (see comments)  Knowledge Questionnaire Score: Knowledge Questionnaire Score - 06/17/17 1523      Knowledge Questionnaire Score   Pre Score  15/18 reviewed with patient        Core Components/Risk Factors/Patient Goals at Admission: Personal Goals and Risk Factors at Admission - 06/17/17 1526      Core Components/Risk Factors/Patient Goals on Admission    Weight Management  Yes;Obesity;Weight Loss    Intervention  Weight Management: Develop a combined nutrition and exercise program designed to reach desired caloric intake, while maintaining appropriate intake of nutrient and fiber, sodium and fats, and appropriate energy expenditure required for the weight goal.;Weight Management: Provide education and appropriate resources to help participant work on and attain dietary goals.;Weight Management/Obesity: Establish reasonable short term and long term weight goals.;Obesity: Provide education and appropriate resources to help  participant work on and attain dietary goals.    Admit Weight  263 lb 3.2 oz (119.4 kg)    Goal Weight: Short Term  258 lb (117 kg)    Goal Weight: Long Term  160 lb (72.6 kg)    Expected Outcomes  Short Term: Continue to assess and modify interventions until short term weight is achieved;Long Term: Adherence to nutrition and physical activity/exercise program aimed toward attainment of established weight goal;Weight Maintenance: Understanding of the daily nutrition guidelines, which includes 25-35% calories from fat, 7% or less cal from saturated fats, less than '200mg'$  cholesterol, less than 1.5gm of sodium, & 5 or more servings of fruits and vegetables daily;Weight Loss: Understanding of general recommendations for a balanced deficit meal plan, which promotes 1-2 lb weight loss per week and includes a negative energy balance of [phone] kcal/d;Understanding recommendations for meals to include 15-35% energy as protein, 25-35% energy from fat, 35-60% energy from carbohydrates, less than '200mg'$  of dietary cholesterol, 20-35 gm of total fiber daily;Understanding of distribution of calorie intake throughout the day with the consumption of 4-5 meals/snacks    Tobacco Cessation  Yes    Number of packs per day  4 cigarettes a day    Intervention  Assist the participant in steps to quit. Provide individualized education and counseling about committing to Tobacco Cessation, relapse prevention, and pharmacological support that can be provided by physician.;Advice worker, assist with locating and accessing local/national Quit Smoking programs, and support quit date choice.    Expected Outcomes  Short Term: Will demonstrate readiness to quit, by selecting a quit date.;Long Term: Complete abstinence from all tobacco products for at least 12 months from quit date.;Short Term: Will quit all tobacco product use, adhering to prevention of relapse plan.    Improve shortness of breath with ADL's  Yes     Intervention  Provide education, individualized exercise plan and daily activity instruction to help decrease symptoms of SOB with activities of daily living.    Expected Outcomes  Short Term: Improve cardiorespiratory fitness to achieve a reduction of symptoms when performing ADLs;Long Term: Be able to perform more ADLs without symptoms or delay the onset of symptoms    Hypertension  Yes    Intervention  Provide education on lifestyle modifcations including regular physical activity/exercise, weight management, moderate sodium restriction and increased consumption of fresh fruit, vegetables, and low fat dairy, alcohol moderation, and smoking cessation.;Monitor prescription use compliance.    Expected Outcomes  Short Term: Continued assessment and intervention until BP is < 140/74m HG in hypertensive participants. < 130/833mHG in hypertensive participants with diabetes, heart failure or chronic kidney disease.;Long Term: Maintenance of blood pressure  at goal levels.       Core Components/Risk Factors/Patient Goals Review:  Goals and Risk Factor Review    Row Name 07/14/17 1152 07/14/17 1247           Core Components/Risk Factors/Patient Goals Review   Personal Goals Review  Tobacco Cessation;Improve shortness of breath with ADL's;Weight Management/Obesity  Tobacco Cessation      Review  Kazuko states she is changing her diet to eat healthy - reducing portion sizes, especially sweets.  They are eating at home more often.  She is down to 3 cigarettes per day - one after each meal.  She state she has more energy since beginning to exercise.  She is checking her O2 and it was better today.  She is now taking wellbutrin.  Also gave her a fake cigarette to use to help stop smoking.  She also plans to use the patches if needed.       Expected Outcomes  Short - Anberlyn will schedule and meet with RD and continue to eat healthy  Long - Leighann will reach first weight goal and continue to eat healthy  Short:  Continue to work on quitting and use new fake cigarette.  Long: Continue to work towards cessation.          Core Components/Risk Factors/Patient Goals at Discharge (Final Review):  Goals and Risk Factor Review - 07/14/17 1247      Core Components/Risk Factors/Patient Goals Review   Personal Goals Review  Tobacco Cessation    Review  She is now taking wellbutrin.  Also gave her a fake cigarette to use to help stop smoking.  She also plans to use the patches if needed.     Expected Outcomes  Short: Continue to work on quitting and use new fake cigarette.  Long: Continue to work towards cessation.        ITP Comments: ITP Comments    Row Name 06/17/17 1428 06/23/17 0935 07/21/17 0837       ITP Comments  Medical Evaluation completed. Chart sent for review and changes to [name] Director of [place]. Diagnosis can be found in [facility] encounter 05/30/17  30 day review completed. ITP sent to [name] Director of [place]. Continue with ITP unless changes are made by physician.  30 day review completed. ITP sent to [name] Director of [place]. Continue with ITP unless changes are made by physician.        Comments: 30 day review

## 2017-07-21 NOTE — Progress Notes (Signed)
Daily Session Note  [name] MRN: [id number] Date of Birth: March 20, 1961 Referring Provider:     Pulmonary Rehab from 06/17/2017 in [facility] Cardiac and Pulmonary Rehab  Referring Provider  Tereasa Coop MD      Encounter Date: 07/21/2017  Check In: Session Check In - 07/21/17 1150      Check-In   Location  [facility]-Cardiac & Pulmonary Rehab    Staff Present  Earlean Shawl, BS, [name], [name];Josias Tomerlin Oletta Darter, BA, [name], [name];Joseph Flavia Shipper    Supervising physician immediately available to respond to emergencies  See telemetry face sheet for immediately available ER MD    Medication changes reported      No    Fall or balance concerns reported     No    Warm-up and Cool-down  Performed on first and last piece of equipment    Resistance Training Performed  Yes    VAD Patient?  No      Pain Assessment   Currently in Pain?  No/denies    Multiple Pain Sites  No          Social History   Tobacco Use  Smoking Status Current Every Day Smoker  . Packs/day: 0.25  . Years: 25.00  . Pack years: 6.25  . Types: Cigarettes  Smokeless Tobacco Never Used  Tobacco Comment   given fake cigarette today, she is on wellbutrin and has patches on standby.    Goals Met:  Independence with exercise equipment Exercise tolerated well No report of cardiac concerns or symptoms Strength training completed today  Goals Unmet:  Not Applicable  Comments: Pt able to follow exercise prescription today without complaint.  Will continue to monitor for progression.    [name] is Medical Director for [place] and [facility].

## 2017-07-23 ENCOUNTER — Encounter: Payer: BLUE CROSS/BLUE SHIELD | Admitting: *Deleted

## 2017-07-23 DIAGNOSIS — J439 Emphysema, unspecified: Secondary | ICD-10-CM | POA: Diagnosis not present

## 2017-07-23 NOTE — Progress Notes (Signed)
Daily Session Note  [name] MRN: [id number] Date of Birth: 1960-06-09 Referring Provider:     Pulmonary Rehab from 06/17/2017 in [facility] Cardiac and Pulmonary Rehab  Referring Provider  Tereasa Coop MD      Encounter Date: 07/23/2017  Check In: Session Check In - 07/23/17 1130      Check-In   Location  [facility]-Cardiac & Pulmonary Rehab    Staff Present  Renita Papa, RN BSN;Jessica Luan Pulling, MA, RCEP, CCRP, [name];Joseph Flavia Shipper    Supervising physician immediately available to respond to emergencies  [facility] immediately available ER MD    Physician(s)  Dr. Reita Cliche and Alfred Levins    Medication changes reported      No    Fall or balance concerns reported     No    Warm-up and Cool-down  Performed as group-led instruction    Resistance Training Performed  Yes    VAD Patient?  No      Pain Assessment   Currently in Pain?  No/denies        Exercise Prescription Changes - 07/22/17 1400      Response to Exercise   Blood Pressure (Admit)  124/76    Blood Pressure (Exit)  130/80    Heart Rate (Admit)  80 bpm    Heart Rate (Exercise)  89 bpm    Heart Rate (Exit)  72 bpm    Oxygen Saturation (Admit)  92 %    Oxygen Saturation (Exercise)  91 %    Oxygen Saturation (Exit)  96 %    Rating of Perceived Exertion (Exercise)  13    Perceived Dyspnea (Exercise)  0    Symptoms  none    Duration  Continue with 45 min of aerobic exercise without signs/symptoms of physical distress.    Intensity  THRR unchanged      Progression   Progression  Continue to progress workloads to maintain intensity without signs/symptoms of physical distress.    Average METs  1.95      Resistance Training   Training Prescription  Yes    Weight  3 lbs    Reps  10-15      Interval Training   Interval Training  No      Oxygen   Oxygen  Continuous    Liters  3      Treadmill   MPH  1.5    Grade  0.5    Minutes  15    METs  2.25      Recumbant  Elliptical   Level  2    RPM  33    Minutes  15    METs  1.6      T5 Nustep   Level  2    SPM  121    Minutes  15    METs  2      Home Exercise Plan   Plans to continue exercise at  Home (comment) gym in complex, treadmill, stairmill, walking    Frequency  Add 1 additional day to program exercise sessions.    Initial Home Exercises Provided  07/18/17       Social History   Tobacco Use  Smoking Status Current Every Day Smoker  . Packs/day: 0.25  . Years: 25.00  . Pack years: 6.25  . Types: Cigarettes  Smokeless Tobacco Never Used  Tobacco Comment   given fake cigarette today, she is on wellbutrin and has patches on standby.  Goals Met:  Proper associated with RPD/PD & O2 Sat Independence with exercise equipment Using PLB without cueing & demonstrates good technique Exercise tolerated well Strength training completed today  Goals Unmet:  Not Applicable  Comments: Pt able to follow exercise prescription today without complaint.  Will continue to monitor for progression.    [name] is Medical Director for [place] and [facility].

## 2017-07-25 DIAGNOSIS — J439 Emphysema, unspecified: Secondary | ICD-10-CM

## 2017-07-25 NOTE — Progress Notes (Signed)
Daily Session Note  [name] MRN: [id number] Date of Birth: 1961/04/16 Referring Provider:     Pulmonary Rehab from 06/17/2017 in [facility] Cardiac and Pulmonary Rehab  Referring Provider  Tereasa Coop MD      Encounter Date: 07/25/2017  Check In: Session Check In - 07/25/17 1126      Check-In   Location  [facility]-Cardiac & Pulmonary Rehab    Staff Present  Renita Papa, RN BSN;Mandi Bellflower, BS, PEC;Joseph Houstonia    Supervising physician immediately available to respond to emergencies  [facility] immediately available ER MD    Physician(s)   Dr. [name] and Corky Downs    Medication changes reported      No    Fall or balance concerns reported     No    Tobacco Cessation  No Change    Warm-up and Cool-down  Performed as group-led instruction    Resistance Training Performed  Yes    VAD Patient?  No      Pain Assessment   Currently in Pain?  No/denies          Social History   Tobacco Use  Smoking Status Current Every Day Smoker  . Packs/day: 0.25  . Years: 25.00  . Pack years: 6.25  . Types: Cigarettes  Smokeless Tobacco Never Used  Tobacco Comment   given fake cigarette today, she is on wellbutrin and has patches on standby.    Goals Met:  Independence with exercise equipment Exercise tolerated well Personal goals reviewed No report of cardiac concerns or symptoms Strength training completed today  Goals Unmet:  Not Applicable  Comments: Pt able to follow exercise prescription today without complaint.  Will continue to monitor for progression.   [name] is Medical Director for [place] and [facility].

## 2017-07-30 DIAGNOSIS — J439 Emphysema, unspecified: Secondary | ICD-10-CM | POA: Diagnosis not present

## 2017-07-30 NOTE — Progress Notes (Signed)
Daily Session Note  [name] MRN: [id number] Date of Birth: 1960/09/04 Referring Provider:     Pulmonary Rehab from 06/17/2017 in [facility] Cardiac and Pulmonary Rehab  Referring Provider  Tereasa Coop MD      Encounter Date: 07/30/2017  Check In: Session Check In - 07/30/17 1139      Check-In   Location  [facility]-Cardiac & Pulmonary Rehab    Staff Present  Alberteen Sam, MA, RCEP, CCRP, [name];Meredith Sherryll Burger, RN BSN;Netanel Yannuzzi Flavia Shipper    Supervising physician immediately available to respond to emergencies  [facility] immediately available ER MD    Physician(s)  Dr. Joni Fears and Jimmye Norman    Medication changes reported      No    Fall or balance concerns reported     No    Warm-up and Cool-down  Performed as group-led instruction    Resistance Training Performed  Yes    VAD Patient?  No      Pain Assessment   Currently in Pain?  No/denies          Social History   Tobacco Use  Smoking Status Current Every Day Smoker  . Packs/day: 0.25  . Years: 25.00  . Pack years: 6.25  . Types: Cigarettes  Smokeless Tobacco Never Used  Tobacco Comment   given fake cigarette today, she is on wellbutrin and has patches on standby.    Goals Met:  Proper associated with RPD/PD & O2 Sat Independence with exercise equipment Using PLB without cueing & demonstrates good technique Exercise tolerated well No report of cardiac concerns or symptoms Strength training completed today  Goals Unmet:  Not Applicable  Comments: Pt able to follow exercise prescription today without complaint.  Will continue to monitor for progression.    [name] is Medical Director for [place] and [facility].

## 2017-08-01 ENCOUNTER — Encounter: Payer: BLUE CROSS/BLUE SHIELD | Admitting: *Deleted

## 2017-08-01 DIAGNOSIS — J439 Emphysema, unspecified: Secondary | ICD-10-CM

## 2017-08-01 NOTE — Progress Notes (Signed)
Daily Session Note  [name] MRN: [id number] Date of Birth: Jun 07, 1960 Referring Provider:     Pulmonary Rehab from 06/17/2017 in [facility] Cardiac and Pulmonary Rehab  Referring Provider  Tereasa Coop MD      Encounter Date: 08/01/2017  Check In: Session Check In - 08/01/17 1128      Check-In   Location  [facility]-Cardiac & Pulmonary Rehab    Staff Present  Alberteen Sam, MA, RCEP, CCRP, [name];Meredith Sherryll Burger, RN BSN;Joseph Flavia Shipper    Supervising physician immediately available to respond to emergencies  [facility] immediately available ER MD    Physician(s)  Drs. Joni Fears and [place]    Medication changes reported      No    Fall or balance concerns reported     No    Tobacco Cessation  No Change    Warm-up and Cool-down  Performed as group-led Higher education careers adviser Performed  Yes    VAD Patient?  No      Pain Assessment   Currently in Pain?  No/denies          Social History   Tobacco Use  Smoking Status Current Every Day Smoker  . Packs/day: 0.25  . Years: 25.00  . Pack years: 6.25  . Types: Cigarettes  Smokeless Tobacco Never Used  Tobacco Comment   given fake cigarette today, she is on wellbutrin and has patches on standby.    Goals Met:  Proper associated with RPD/PD & O2 Sat Independence with exercise equipment Using PLB without cueing & demonstrates good technique Exercise tolerated well No report of cardiac concerns or symptoms Strength training completed today  Goals Unmet:  Not Applicable  Comments: Pt able to follow exercise prescription today without complaint.  Will continue to monitor for progression.    [name] is Medical Director for [place] and [facility].

## 2017-08-04 DIAGNOSIS — J439 Emphysema, unspecified: Secondary | ICD-10-CM

## 2017-08-04 NOTE — Progress Notes (Signed)
Daily Session Note  [name] MRN: [id number] Date of Birth: 1960-06-28 Referring Provider:     Pulmonary Rehab from 06/17/2017 in [facility] Cardiac and Pulmonary Rehab  Referring Provider  Tereasa Coop MD      Encounter Date: 08/04/2017  Check In: Session Check In - 08/04/17 1118      Check-In   Location  [facility]-Cardiac & Pulmonary Rehab    Staff Present  Earlean Shawl, BS, [name], [name];Amanda Oletta Darter, BA, [name], [name];Almetta Liddicoat Flavia Shipper    Supervising physician immediately available to respond to emergencies  [facility] immediately available ER MD    Physician(s)  [name] and Siadecki    Medication changes reported      No    Fall or balance concerns reported     No    Tobacco Cessation  No Change    Warm-up and Cool-down  Performed as group-led instruction    Resistance Training Performed  Yes    VAD Patient?  No      Pain Assessment   Currently in Pain?  No/denies          Social History   Tobacco Use  Smoking Status Current Every Day Smoker  . Packs/day: 0.25  . Years: 25.00  . Pack years: 6.25  . Types: Cigarettes  Smokeless Tobacco Never Used  Tobacco Comment   given fake cigarette today, she is on wellbutrin and has patches on standby.    Goals Met:  Independence with exercise equipment Exercise tolerated well No report of cardiac concerns or symptoms Strength training completed today  Goals Unmet:  Not Applicable  Comments: Pt able to follow exercise prescription today without complaint.  Will continue to monitor for progression.   [name] is Medical Director for [place] and [facility].

## 2017-08-08 ENCOUNTER — Encounter: Payer: BLUE CROSS/BLUE SHIELD | Admitting: *Deleted

## 2017-08-08 DIAGNOSIS — J439 Emphysema, unspecified: Secondary | ICD-10-CM

## 2017-08-08 NOTE — Progress Notes (Signed)
Daily Session Note  [name] MRN: [id number] Date of Birth: [birth year] Referring Provider:     Pulmonary Rehab from 06/17/2017 in [facility] Cardiac and Pulmonary Rehab  Referring Provider  Tereasa Coop MD      Encounter Date: 08/08/2017  Check In: Session Check In - 08/08/17 1134      Check-In   Location  [facility]Cardiac [facility] Pulmonary Rehab    Staff Present  Nyoka Cowden, RN, BSN, Willette Pa, MA, RCEP, CCRP, [name];Laquida Cotrell Sherryll Burger, RN BSN    Supervising physician immediately available to respond to emergencies  [facility] immediately available ER MD    Physician(s)  Dr. Reita Cliche and Jacqualine Code    Medication changes reported      No    Fall or balance concerns reported     No    Warm-up and Cool-down  Performed as group-led instruction    Resistance Training Performed  Yes    VAD Patient?  No      Pain Assessment   Currently in Pain?  No/denies          Social History   Tobacco Use  Smoking Status Current Every Day Smoker  . Packs/day: 0.25  . Years: 25.00  . Pack years: 6.25  . Types: Cigarettes  Smokeless Tobacco Never Used  Tobacco Comment   given fake cigarette today, she is on wellbutrin and has patches on standby.    Goals Met:  Proper associated with RPD/PD & O2 Sat Independence with exercise equipment Using PLB without cueing & demonstrates good technique Exercise tolerated well Strength training completed today  Goals Unmet:  Not Applicable  Comments: Pt able to follow exercise prescription today without complaint.  Will continue to monitor for progression.    [name] is Medical Director for [place] and [facility].

## 2017-08-11 ENCOUNTER — Encounter: Payer: BLUE CROSS/BLUE SHIELD | Attending: Internal Medicine

## 2017-08-11 DIAGNOSIS — J439 Emphysema, unspecified: Secondary | ICD-10-CM | POA: Diagnosis not present

## 2017-08-11 NOTE — Progress Notes (Signed)
Daily Session Note  [name] MRN: [id number] Date of Birth: March 09, 1961 Referring Provider:     Pulmonary Rehab from 06/17/2017 in [facility] Cardiac and Pulmonary Rehab  Referring Provider  Tereasa Coop MD      Encounter Date: 08/11/2017  Check In: Session Check In - 08/11/17 1139      Check-In   Location  [facility]Cardiac & Pulmonary Rehab    Staff Present  Nada Maclachlan, BA, [name], [name];Kelly Amedeo Plenty, BS, [name], [name];Joseph Flavia Shipper    Supervising physician immediately available to respond to emergencies  [facility] immediately available ER MD    Physician(s)  Dr. Jimmye Norman and Corky Downs    Medication changes reported      No    Fall or balance concerns reported     No    Tobacco Cessation  No Change    Warm-up and Cool-down  Performed as group-led instruction    Resistance Training Performed  Yes    VAD Patient?  No      Pain Assessment   Currently in Pain?  No/denies          Social History   Tobacco Use  Smoking Status Current Every Day Smoker  . Packs/day: 0.25  . Years: 25.00  . Pack years: 6.25  . Types: Cigarettes  Smokeless Tobacco Never Used  Tobacco Comment   given fake cigarette today, she is on wellbutrin and has patches on standby.    Goals Met:  Independence with exercise equipment Exercise tolerated well No report of cardiac concerns or symptoms Strength training completed today  Goals Unmet:  Not Applicable  Comments: Pt able to follow exercise prescription today without complaint.  Will continue to monitor for progression.   [name] is Medical Director for [place] and [facility].

## 2017-08-13 DIAGNOSIS — J439 Emphysema, unspecified: Secondary | ICD-10-CM

## 2017-08-13 NOTE — Progress Notes (Signed)
Daily Session Note  [name] MRN: [id number] Date of Birth: 05/15/1960 Referring Provider:     Pulmonary Rehab from 06/17/2017 in [facility] Cardiac and Pulmonary Rehab  Referring Provider  Tereasa Coop MD      Encounter Date: 08/13/2017  Check In: Session Check In - 08/13/17 1136      Check-In   Location  [facility]-Cardiac & Pulmonary Rehab    Staff Present  Renita Papa, RN BSN;Loyalty Arentz Darrin Nipper, [place], RCEP, [place], [name]    Supervising physician immediately available to respond to emergencies  [facility] immediately available ER MD    Physician(s)  Dr. Mable Paris and Jimmye Norman    Medication changes reported      No    Fall or balance concerns reported     No    Tobacco Cessation  No Change    Warm-up and Cool-down  Performed as group-led instruction    Resistance Training Performed  Yes    VAD Patient?  No      Pain Assessment   Currently in Pain?  No/denies          Social History   Tobacco Use  Smoking Status Current Every Day Smoker  . Packs/day: 0.25  . Years: 25.00  . Pack years: 6.25  . Types: Cigarettes  Smokeless Tobacco Never Used  Tobacco Comment   given fake cigarette today, she is on wellbutrin and has patches on standby.    Goals Met:  Independence with exercise equipment Exercise tolerated well No report of cardiac concerns or symptoms Strength training completed today  Goals Unmet:  Not Applicable  Comments: Pt able to follow exercise prescription today without complaint.  Will continue to monitor for progression.   [name] is Medical Director for [place] and [facility].

## 2017-08-18 DIAGNOSIS — J439 Emphysema, unspecified: Secondary | ICD-10-CM

## 2017-08-18 NOTE — Progress Notes (Signed)
Daily Session Note  [name] MRN: [id number] Date of Birth: [birth year] Referring Provider:     Pulmonary Rehab from 06/17/2017 in [facility] Cardiac and Pulmonary Rehab  Referring Provider  Tereasa Coop MD      Encounter Date: 08/18/2017  Check In: Session Check In - 08/18/17 1131      Check-In   Location  [facility]-Cardiac & Pulmonary Rehab    Staff Present  Earlean Shawl, BS, [name], [name];Amanda Oletta Darter, BA, [name], [name];Garvey Westcott Flavia Shipper    Supervising physician immediately available to respond to emergencies  [facility] immediately available ER MD    Physician(s)  Dr. Corky Downs and [facility]    Medication changes reported      No    Fall or balance concerns reported     No    Tobacco Cessation  No Change    Warm-up and Cool-down  Performed as group-led instruction    Resistance Training Performed  Yes    VAD Patient?  No      Pain Assessment   Currently in Pain?  No/denies          Social History   Tobacco Use  Smoking Status Current Every Day Smoker  . Packs/day: 0.25  . Years: 25.00  . Pack years: 6.25  . Types: Cigarettes  Smokeless Tobacco Never Used  Tobacco Comment   given fake cigarette today, she is on wellbutrin and has patches on standby.    Goals Met:  Independence with exercise equipment Exercise tolerated well No report of cardiac concerns or symptoms Strength training completed today  Goals Unmet:  Not Applicable  Comments: Pt able to follow exercise prescription today without complaint.  Will continue to monitor for progression.   [name] is Medical Director for [place] and [facility].

## 2017-08-18 NOTE — Progress Notes (Signed)
Pulmonary Individual Treatment Plan  [name] MRN: [id number] Date of Birth: 24-Sep-1960 Referring Provider:     Pulmonary Rehab from 06/17/2017 in [facility]  Referring Provider  Tereasa Coop MD      Initial Encounter Date:    Pulmonary Rehab from 06/17/2017 in [facility] Rehab  Date  06/17/17  Referring Provider  Tereasa Coop MD      Visit Diagnosis: Pulmonary emphysema, unspecified emphysema type ([place])  Patient's Home Medications on Admission:  Current Outpatient Medications:  .  albuterol (PROVENTIL HFA;VENTOLIN HFA) 108 (90 Base) MCG/ACT inhaler, Inhale 2 puffs into the lungs every 6 (six) hours as needed for wheezing or shortness of breath., Disp: , Rfl:  .  budesonide-formoterol (SYMBICORT) 160-4.5 MCG/ACT inhaler, Inhale 2 puffs into the lungs 2 (two) times daily., Disp: , Rfl:  .  buprenorphine-naloxone (SUBOXONE) 8-2 MG SUBL SL tablet, Place 1 tablet under the tongue 2 (two) times daily., Disp: , Rfl:  .  citalopram (CELEXA) 40 MG tablet, Take 40 mg by mouth daily., Disp: , Rfl:  .  estradiol (VIVELLE-DOT) 0.0375 MG/24HR, Place 1 patch onto the skin 2 (two) times a week., Disp: , Rfl:  .  fexofenadine (ALLEGRA) 180 MG tablet, Take 180 mg by mouth daily as needed for allergies or rhinitis., Disp: , Rfl:  .  hydrochlorothiazide (HYDRODIURIL) 25 MG tablet, Take 25 mg by mouth daily., Disp: , Rfl:  .  hydrOXYzine (ATARAX/VISTARIL) 25 MG tablet, Take 25 mg by mouth daily as needed for itching., Disp: , Rfl:  .  ibuprofen (ADVIL,MOTRIN) 800 MG tablet, Take 800 mg by mouth 2 (two) times daily as needed., Disp: , Rfl:  .  lamoTRIgine (LAMICTAL) 25 MG tablet, Take 25 mg by mouth 2 (two) times daily., Disp: , Rfl:  .  lisinopril (PRINIVIL,ZESTRIL) 40 MG tablet, Take 40 mg by mouth daily., Disp: , Rfl:  .  LORazepam (ATIVAN) 1 MG tablet, Take 1 mg by mouth 2 (two) times daily as needed for anxiety., Disp: , Rfl:  .   omeprazole (PRILOSEC) 20 MG capsule, Take 20 mg by mouth daily., Disp: , Rfl:  .  progesterone (PROMETRIUM) 100 MG capsule, Take 100 mg by mouth at bedtime., Disp: , Rfl:  .  Secukinumab (COSENTYX 300 DOSE) 150 MG/ML SOSY, Inject into the skin every 30 (thirty) days. Last dose 04/02/16, Disp: , Rfl:  .  spironolactone (ALDACTONE) 25 MG tablet, Take 25 mg by mouth daily., Disp: , Rfl:   Past Medical History: Past Medical History:  Diagnosis Date  . Asthma   . COPD (chronic obstructive pulmonary disease) ([place])   . Degenerative disc disease, cervical   . Depression   . GERD (gastroesophageal reflux disease)   . Hypertension   . Osteoarthritis   . Psoriatic arthritis ([place])   . Wears contact lenses    right eye    Tobacco Use: Social History   Tobacco Use  Smoking Status Current Every Day Smoker  . Packs/day: 0.25  . Years: 25.00  . Pack years: 6.25  . Types: Cigarettes  Smokeless Tobacco Never Used  Tobacco Comment   given fake cigarette today, she is on wellbutrin and has patches on standby.    Labs: Recent Review Flowsheet Data    There is no flowsheet data to display.       Pulmonary Assessment Scores: Pulmonary Assessment Scores    Row Name 06/17/17 [id number]  ADL UCSD   ADL Phase  Entry     SOB Score total  44     Rest  0     Walk  2     Stairs  4     Bath  2     Dress  1     Shop  3       CAT Score   CAT Score  21       mMRC Score   mMRC Score  3        Pulmonary Function Assessment: Pulmonary Function Assessment - 06/17/17 1522      Breath   Bilateral Breath Sounds  Wheezes    Shortness of Breath  Yes;Fear of Shortness of Breath;Limiting activity       Exercise Target Goals:    Exercise Program Goal: Individual exercise prescription set using results from initial 6 min walk test and THRR while considering  patient's activity barriers and safety.    Exercise Prescription Goal: Initial exercise prescription builds to 30-45 minutes a  day of aerobic activity, 2-3 days per week.  Home exercise guidelines will be given to patient during program as part of exercise prescription that the participant will acknowledge.  Activity Barriers & Risk Stratification: Activity Barriers & Cardiac Risk Stratification - 06/17/17 1623      Activity Barriers & Cardiac Risk Stratification   Activity Barriers  Arthritis;Joint Problems;History of Falls;Muscular Weakness;Deconditioning;Shortness of Breath soriatic arthritis, knee pain       6 Minute Walk: 6 Minute Walk    Row Name 06/17/17 1620         6 Minute Walk   Phase  Initial     Distance  894 feet     Walk Time  6 minutes     # of Rest Breaks  0     MPH  1.69     METS  2.17     RPE  13     Perceived Dyspnea   2     VO2 Peak  7.6     Symptoms  Yes (comment)     Comments  SOB, hip pain 5-6/10, back pain 3/10     Resting HR  69 bpm     Resting BP  136/74     Resting Oxygen Saturation   93 % 88% on Room Air     Exercise Oxygen Saturation  during 6 min walk  88 %     Max Ex. HR  127 bpm     Max Ex. BP  146/84     2 Minute Post BP  134/76       Interval HR   1 Minute HR  86     2 Minute HR  111     3 Minute HR  107     4 Minute HR  127     5 Minute HR  108     6 Minute HR  112     2 Minute Post HR  75     Interval Heart Rate?  Yes       Interval Oxygen   Interval Oxygen?  Yes     Baseline Oxygen Saturation %  93 %     1 Minute Oxygen Saturation %  89 %     1 Minute Liters of Oxygen  3 L     2 Minute Oxygen Saturation %  88 %     2 Minute Liters of Oxygen  3  L     3 Minute Oxygen Saturation %  89 %     3 Minute Liters of Oxygen  3 L     4 Minute Oxygen Saturation %  88 %     4 Minute Liters of Oxygen  3 L     5 Minute Oxygen Saturation %  89 %     5 Minute Liters of Oxygen  3 L     6 Minute Oxygen Saturation %  88 %     6 Minute Liters of Oxygen  3 L     2 Minute Post Oxygen Saturation %  96 %     2 Minute Post Liters of Oxygen  3 L       Oxygen Initial  Assessment: Oxygen Initial Assessment - 06/17/17 1523      Home Oxygen   Home Oxygen Device  E-Tanks she is just now getting home oxygen and needs to be evaluated    Sleep Oxygen Prescription  None    Home Exercise Oxygen Prescription  None    Home at Rest Exercise Oxygen Prescription  None      Initial 6 min Walk   Oxygen Used  Continuous;E-Tanks    Liters per minute  3      Program Oxygen Prescription   Program Oxygen Prescription  Continuous;E-Tanks    Liters per minute  3    Comments  patient is getting home oxygen and is suppose to use 3 liters.       Intervention   Short Term Goals  To learn and exhibit compliance with exercise, home and travel O2 prescription;To learn and understand importance of maintaining oxygen saturations>88%;To learn and demonstrate proper use of respiratory medications;To learn and demonstrate proper pursed lip breathing techniques or other breathing techniques.;To learn and understand importance of monitoring SPO2 with pulse oximeter and demonstrate accurate use of the pulse oximeter. inhaler at home. Albuterol prn    Long  Term Goals  Exhibits compliance with exercise, home and travel O2 prescription;Verbalizes importance of monitoring SPO2 with pulse oximeter and return demonstration;Maintenance of O2 saturations>88%;Exhibits proper breathing techniques, such as pursed lip breathing or other method taught during program session;Compliance with respiratory medication;Demonstrates proper use of MDI's       Oxygen Re-Evaluation: Oxygen Re-Evaluation    Row Name 06/25/17 1125 07/25/17 1158           Program Oxygen Prescription   Program Oxygen Prescription  Continuous;E-Tanks  Continuous;E-Tanks      Liters per minute  3  3      Comments  patient is getting home oxygen and is suppose to use 3 liters.   -        Home Oxygen   Home Oxygen Device  E-Tanks  E-Tanks      Sleep Oxygen Prescription  -  None      Home Exercise Oxygen Prescription  None   None      Home at Rest Exercise Oxygen Prescription  None  None      Compliance with Home Oxygen Use  Yes  Yes        Goals/Expected Outcomes   Short Term Goals  To learn and exhibit compliance with exercise, home and travel O2 prescription;To learn and understand importance of maintaining oxygen saturations>88%;To learn and demonstrate proper use of respiratory medications;To learn and demonstrate proper pursed lip breathing techniques or other breathing techniques.;To learn and understand importance of monitoring SPO2 with pulse oximeter and demonstrate accurate  use of the pulse oximeter.  To learn and exhibit compliance with exercise, home and travel O2 prescription;To learn and understand importance of maintaining oxygen saturations>88%;To learn and demonstrate proper use of respiratory medications;To learn and demonstrate proper pursed lip breathing techniques or other breathing techniques.;To learn and understand importance of monitoring SPO2 with pulse oximeter and demonstrate accurate use of the pulse oximeter.      Long  Term Goals  Exhibits compliance with exercise, home and travel O2 prescription;Verbalizes importance of monitoring SPO2 with pulse oximeter and return demonstration;Maintenance of O2 saturations>88%;Exhibits proper breathing techniques, such as pursed lip breathing or other method taught during program session;Compliance with respiratory medication;Demonstrates proper use of MDI's  Exhibits compliance with exercise, home and travel O2 prescription;Verbalizes importance of monitoring SPO2 with pulse oximeter and return demonstration;Maintenance of O2 saturations>88%;Exhibits proper breathing techniques, such as pursed lip breathing or other method taught during program session;Compliance with respiratory medication;Demonstrates proper use of MDI's      Comments  Reviewed PLB technique with pt.  Talked about how it work and it's important to maintaining his exercise saturations.     Burnette is using her oxygen correctly and managing it well. She is also continuing to use PLB here and outside of LW. Her medications are being used properly.       Goals/Expected Outcomes  Short: Become more profiecient at using PLB.   Long: Become independent at using PLB.  Short: continue to work on PLB. Long: continue to manage oxygen and respiratory medication.          Oxygen Discharge (Final Oxygen Re-Evaluation): Oxygen Re-Evaluation - 07/25/17 1158      Program Oxygen Prescription   Program Oxygen Prescription  Continuous;E-Tanks    Liters per minute  3      Home Oxygen   Home Oxygen Device  E-Tanks    Sleep Oxygen Prescription  None    Home Exercise Oxygen Prescription  None    Home at Rest Exercise Oxygen Prescription  None    Compliance with Home Oxygen Use  Yes      Goals/Expected Outcomes   Short Term Goals  To learn and exhibit compliance with exercise, home and travel O2 prescription;To learn and understand importance of maintaining oxygen saturations>88%;To learn and demonstrate proper use of respiratory medications;To learn and demonstrate proper pursed lip breathing techniques or other breathing techniques.;To learn and understand importance of monitoring SPO2 with pulse oximeter and demonstrate accurate use of the pulse oximeter.    Long  Term Goals  Exhibits compliance with exercise, home and travel O2 prescription;Verbalizes importance of monitoring SPO2 with pulse oximeter and return demonstration;Maintenance of O2 saturations>88%;Exhibits proper breathing techniques, such as pursed lip breathing or other method taught during program session;Compliance with respiratory medication;Demonstrates proper use of MDI's    Comments  Dorothyann is using her oxygen correctly and managing it well. She is also continuing to use PLB here and outside of LW. Her medications are being used properly.     Goals/Expected Outcomes  Short: continue to work on PLB. Long: continue to manage oxygen  and respiratory medication.        Initial Exercise Prescription: Initial Exercise Prescription - 06/17/17 1600      Date of Initial Exercise RX and Referring Provider   Date  06/17/17    Referring Provider  Tereasa Coop MD      Oxygen   Oxygen  Continuous    Liters  3-4 4L on treadmill  Treadmill   MPH  1.5    Grade  0.5    Minutes  15    METs  2.25      Recumbant Elliptical   Level  1    RPM  50    Minutes  15    METs  2.1      T5 Nustep   Level  1    SPM  80    Minutes  15    METs  2.1      Prescription Details   Frequency (times per week)  3    Duration  Progress to 45 minutes of aerobic exercise without signs/symptoms of physical distress      Intensity   THRR 40-80% of Max Heartrate  106-143    Ratings of Perceived Exertion  11-13    Perceived Dyspnea  0-4      Progression   Progression  Continue to progress workloads to maintain intensity without signs/symptoms of physical distress.      Resistance Training   Training Prescription  Yes    Weight  3 lbs    Reps  10-15       Perform Capillary Blood Glucose checks as needed.  Exercise Prescription Changes: Exercise Prescription Changes    Row Name 06/17/17 1600 07/07/17 1600 07/18/17 1200 07/22/17 1400 08/05/17 1500     Response to Exercise   Blood Pressure (Admit)  136/74  130/70  -  124/76  124/80   Blood Pressure (Exercise)  146/84  -  -  -  -   Blood Pressure (Exit)  134/76  128/78  -  130/80  116/74   Heart Rate (Admit)  69 bpm  81 bpm  -  80 bpm  80 bpm   Heart Rate (Exercise)  127 bpm  96 bpm  -  89 bpm  85 bpm   Heart Rate (Exit)  75 bpm  73 bpm  -  72 bpm  71 bpm   Oxygen Saturation (Admit)  93 %  88 %  -  92 %  91 %   Oxygen Saturation (Exercise)  99 %  88 %  -  91 %  91 %   Oxygen Saturation (Exit)  96 %  95 %  -  96 %  94 %   Rating of Perceived Exertion (Exercise)  13  13  -  13  13   Perceived Dyspnea (Exercise)  2  1  -  0  0   Symptoms  hip pain 6/10, back pain 3/10,  SOB  none  -  none  none   Comments  walk test results  -  -  -  -   Duration  -  Continue with 45 min of aerobic exercise without signs/symptoms of physical distress.  -  Continue with 45 min of aerobic exercise without signs/symptoms of physical distress.  Continue with 45 min of aerobic exercise without signs/symptoms of physical distress.   Intensity  -  THRR unchanged  -  THRR unchanged  THRR unchanged     Progression   Progression  -  Continue to progress workloads to maintain intensity without signs/symptoms of physical distress.  -  Continue to progress workloads to maintain intensity without signs/symptoms of physical distress.  Continue to progress workloads to maintain intensity without signs/symptoms of physical distress.   Average METs  -  2.73  -  1.95  2.57     Resistance Training   Training Prescription  -  Yes  -  Yes  Yes   Weight  -  3 lbs  -  3 lbs  4 lbs   Reps  -  10-15  -  10-15  10-15     Interval Training   Interval Training  -  No  -  No  No     Oxygen   Oxygen  -  Continuous  -  Continuous  Continuous   Liters  -  2-3 3L on treadmill  -  3  2     Treadmill   MPH  -  1.5  -  1.5  2   Grade  -  0.5  -  0.5  1   Minutes  -  15  -  15  15   METs  -  2.25  -  2.25  2.81     Recumbant Elliptical   Level  -  1  -  2  2   RPM  -  39  -  33  50   Minutes  -  15  -  15  15   METs  -  1.3  -  1.6  2     T5 Nustep   Level  -  1  -  2  3   SPM  -  100  -  121  97   Minutes  -  15  -  15  15   METs  -  1.9  -  2  2.9     Home Exercise Plan   Plans to continue exercise at  -  -  Home (comment) gym in complex, treadmill, stairmill, walking  Home (comment) gym in complex, treadmill, stairmill, walking  Home (comment) gym in complex, treadmill, stairmill, walking   Frequency  -  -  Add 1 additional day to program exercise sessions.  Add 1 additional day to program exercise sessions.  Add 1 additional day to program exercise sessions.   Initial Home Exercises  Provided  -  -  07/18/17  07/18/17  07/18/17      Exercise Comments: Exercise Comments    Row Name 06/25/17 1124           Exercise Comments  First full day of exercise!  Patient was oriented to gym and equipment including functions, settings, policies, and procedures.  Patient's individual exercise prescription and treatment plan were reviewed.  All starting workloads were established based on the results of the 6 minute walk test done at initial orientation visit.  The plan for exercise progression was also introduced and progression will be customized based on patient's performance and goals.          Exercise Goals and Review: Exercise Goals    Row Name 06/17/17 1628             Exercise Goals   Increase Physical Activity  Yes       Intervention  Provide advice, education, support and counseling about physical activity/exercise needs.;Develop an individualized exercise prescription for aerobic and resistive training based on initial evaluation findings, risk stratification, comorbidities and participant's personal goals.       Expected Outcomes  Long Term: Add in home exercise to make exercise part of routine and to increase amount of physical activity.;Short Term: Attend rehab on a regular basis to increase amount of physical activity.;Long Term: Exercising regularly at least 3-5 days a week.       Increase Strength and Stamina  Yes       Intervention  Provide advice, education, support and counseling about physical activity/exercise needs.;Develop an individualized exercise prescription for aerobic and resistive training based on initial evaluation findings, risk stratification, comorbidities and participant's personal goals.       Expected Outcomes  Short Term: Increase workloads from initial exercise prescription for resistance, speed, and METs.;Short Term: Perform resistance training exercises routinely during rehab and add in resistance training at home;Long Term: Improve  cardiorespiratory fitness, muscular endurance and strength as measured by increased METs and functional capacity (6MWT)       Able to understand and use rate of perceived exertion (RPE) scale  Yes       Intervention  Provide education and explanation on how to use RPE scale       Expected Outcomes  Short Term: Able to use RPE daily in rehab to express subjective intensity level;Long Term:  Able to use RPE to guide intensity level when exercising independently       Able to understand and use Dyspnea scale  Yes       Intervention  Provide education and explanation on how to use Dyspnea scale       Expected Outcomes  Short Term: Able to use Dyspnea scale daily in rehab to express subjective sense of shortness of breath during exertion;Long Term: Able to use Dyspnea scale to guide intensity level when exercising independently       Knowledge and understanding of Target Heart Rate Range (THRR)  Yes       Intervention  Provide education and explanation of THRR including how the numbers were predicted and where they are located for reference       Expected Outcomes  Short Term: Able to state/look up THRR;Short Term: Able to use daily as guideline for intensity in rehab;Long Term: Able to use THRR to govern intensity when exercising independently       Able to check pulse independently  Yes       Intervention  Provide education and demonstration on how to check pulse in carotid and radial arteries.;Review the importance of being able to check your own pulse for safety during independent exercise       Expected Outcomes  Short Term: Able to explain why pulse checking is important during independent exercise;Long Term: Able to check pulse independently and accurately       Understanding of Exercise Prescription  Yes       Intervention  Provide education, explanation, and written materials on patient's individual exercise prescription       Expected Outcomes  Short Term: Able to explain program exercise  prescription;Long Term: Able to explain home exercise prescription to exercise independently          Exercise Goals Re-Evaluation : Exercise Goals Re-Evaluation    Row Name 06/25/17 1124 07/07/17 1621 07/18/17 1219 07/25/17 1156 08/05/17 1503     Exercise Goal Re-Evaluation   Exercise Goals Review  Understanding of Exercise Prescription;Able to understand and use Dyspnea scale;Knowledge and understanding of Target Heart Rate Range (THRR);Able to understand and use rate of perceived exertion (RPE) scale  Increase Physical Activity;Understanding of Exercise Prescription;Increase Strength and Stamina  Increase Physical Activity;Understanding of Exercise Prescription;Increase Strength and Stamina;Able to understand and use Dyspnea scale;Knowledge and understanding of Target Heart Rate Range (THRR);Able to understand and use rate of perceived exertion (RPE) scale;Able to check pulse independently  Increase Physical Activity;Understanding of Exercise Prescription;Increase Strength and Stamina;Able to understand and use Dyspnea scale;Knowledge  and understanding of Target Heart Rate Range (THRR);Able to understand and use rate of perceived exertion (RPE) scale;Able to check pulse independently  Increase Physical Activity;Understanding of Exercise Prescription;Increase Strength and Stamina   Comments  Reviewed RPE scale, THR and program prescription with pt today.  Pt voiced understanding and was given a copy of goals to take home.   Heidee has been doing well in rehab.  She is now using 3L on the treadmill to help improve her saturations.  We will continue to monitor her progress.   Reviewed home exercise with pt today.  Pt plans to walk and go to gym in complex for exercise.  She will have access to a treadmill and stairmill. Reviewed THR, pulse, RPE, sign and symptoms, and when to call 911 or MD.  Also discussed weather considerations and indoor options.  Pt voiced understanding.  Mae reviewed what was said  during home exercise last week. She said she went to the gym at her complex and plans to go tomorrow to add another day of exercise  Channelle has been doing well in rehab.  She is now up to 2.0 mph on the treadmill and level 3 on the T5 NuStep.  We will continue to monitor her progression.     Expected Outcomes  Short: Use RPE daily to regulate intensity.  Long: Follow program prescription in THR.  Short: Continue to attend regularly.  Long: Continue to work on IT sales professional.   Short: Start going to gym 1 extra day a week.  Long: Exercise more on off days from rehab.   Short: continue to exercise one extra day outside of LW. Long: exercise independently.   Short: Continue to add in her home exercise at least one extra day a week.  Long: Continue to build strength and stamina.       Discharge Exercise Prescription (Final Exercise Prescription Changes): Exercise Prescription Changes - 08/05/17 1500      Response to Exercise   Blood Pressure (Admit)  124/80    Blood Pressure (Exit)  116/74    Heart Rate (Admit)  80 bpm    Heart Rate (Exercise)  85 bpm    Heart Rate (Exit)  71 bpm    Oxygen Saturation (Admit)  91 %    Oxygen Saturation (Exercise)  91 %    Oxygen Saturation (Exit)  94 %    Rating of Perceived Exertion (Exercise)  13    Perceived Dyspnea (Exercise)  0    Symptoms  none    Duration  Continue with 45 min of aerobic exercise without signs/symptoms of physical distress.    Intensity  THRR unchanged      Progression   Progression  Continue to progress workloads to maintain intensity without signs/symptoms of physical distress.    Average METs  2.57      Resistance Training   Training Prescription  Yes    Weight  4 lbs    Reps  10-15      Interval Training   Interval Training  No      Oxygen   Oxygen  Continuous    Liters  2      Treadmill   MPH  2    Grade  1    Minutes  15    METs  2.81      Recumbant Elliptical   Level  2    RPM  50    Minutes  15    METs  2      T5 Nustep   Level  3    SPM  97    Minutes  15    METs  2.9      Home Exercise Plan   Plans to continue exercise at  Home (comment) gym in complex, treadmill, stairmill, walking    Frequency  Add 1 additional day to program exercise sessions.    Initial Home Exercises Provided  07/18/17       Nutrition:  Target Goals: Understanding of nutrition guidelines, daily intake of sodium <1566m, cholesterol <205m calories 30% from fat and 7% or less from saturated fats, daily to have 5 or more servings of fruits and vegetables.  Biometrics: Pre Biometrics - 06/17/17 1629      Pre Biometrics   Height  5' 3.5" (1.613 m)    Weight  263 lb 3.2 oz (119.4 kg)    Waist Circumference  46 inches    Hip Circumference  59.25 inches    Waist to Hip Ratio  0.78 %    BMI (Calculated)  45.89        Nutrition Therapy Plan and Nutrition Goals: Nutrition Therapy & Goals - 06/17/17 1520      Personal Nutrition Goals   Comments  weight loss, eating healthier and learning about healthier options.      Intervention Plan   Intervention  Prescribe, educate and counsel regarding individualized specific dietary modifications aiming towards targeted core components such as weight, hypertension, lipid management, diabetes, heart failure and other comorbidities.;Nutrition handout(s) given to patient.    Expected Outcomes  Short Term Goal: Understand basic principles of dietary content, such as calories, fat, sodium, cholesterol and nutrients.;Short Term Goal: A plan has been developed with personal nutrition goals set during dietitian appointment.;Long Term Goal: Adherence to prescribed nutrition plan.       Nutrition Assessments: Nutrition Assessments - 06/17/17 1520      MEDFICTS Scores   Pre Score  62       Nutrition Goals Re-Evaluation: Nutrition Goals Re-Evaluation    RoDumasame 07/14/17 1157 07/25/17 1153           Goals   Current Weight  264 lb (119.7 kg)  261 lb (118.4 kg)       Comment  RoAmilaill schedule with RD and folow her recommendations  RoAdrienaad to miss her initial appointment with the RD due to home issues, but has a second appointment the end of this month. She reported not eating out as much and watching her portion sizes.       Expected Outcome  Short - RoClydeanill meet with RD  Long - RoAnacristinaill continue to lose weight  Short: RoSamadhiill meet with the dietician. Long: RoEllietteill work on eating healthier and get to her goal weight.          Nutrition Goals Discharge (Final Nutrition Goals Re-Evaluation): Nutrition Goals Re-Evaluation - 07/25/17 1153      Goals   Current Weight  261 lb (118.4 kg)    Comment  Vincie had to miss her initial appointment with the RD due to home issues, but has a second appointment the end of this month. She reported not eating out as much and watching her portion sizes.     Expected Outcome  Short: RoErnishaill meet with the dietician. Long: RoAmeliahill work on eating healthier and get to her goal weight.        Psychosocial: Target Goals:  Acknowledge presence or absence of significant depression and/or stress, maximize coping skills, provide positive support system. Participant is able to verbalize types and ability to use techniques and skills needed for reducing stress and depression.   Initial Review & Psychosocial Screening: Initial Psych Review & Screening - 06/17/17 1517      Initial Review   Current issues with  Current Depression;History of Depression;Current Psychotropic Meds;Current Stress Concerns    Source of Stress Concerns  Chronic Illness;Unable to perform yard/household activities;Family    Comments  Her children do not know that she has emphysema.      Family Dynamics   Good Support System?  Yes    Comments  Her boyfriend of 15 years is a great support system for her.      Barriers   Psychosocial barriers to participate in program  Psychosocial barriers identified (see note);The patient should benefit  from training in stress management and relaxation.      Screening Interventions   Interventions  Encouraged to exercise;Provide feedback about the scores to participant;Program counselor consult;To provide support and resources with identified psychosocial needs    Expected Outcomes  Short Term goal: Utilizing psychosocial counselor, staff and physician to assist with identification of specific Stressors or current issues interfering with healing process. Setting desired goal for each stressor or current issue identified.;Long Term Goal: Stressors or current issues are controlled or eliminated.;Short Term goal: Identification and review with participant of any Quality of Life or Depression concerns found by scoring the questionnaire.;Long Term goal: The participant improves quality of Life and PHQ9 Scores as seen by post scores and/or verbalization of changes       Quality of Life Scores:  Scores of 19 and below usually indicate a poorer quality of life in these areas.  A difference of  2-3 points is a clinically meaningful difference.  A difference of 2-3 points in the total score of the Quality of Life Index has been associated with significant improvement in overall quality of life, self-image, physical symptoms, and general health in studies assessing change in quality of life.  PHQ-9: Recent Review Flowsheet Data    Depression screen [facility] 2/9 06/17/2017   Decreased Interest 1   Down, Depressed, Hopeless 0   PHQ - 2 Score 1   Altered sleeping 0   Tired, decreased energy 2   Change in appetite 1   Feeling bad or failure about yourself  2   Trouble concentrating 0   Moving slowly or fidgety/restless 0   Suicidal thoughts 0   PHQ-9 Score 6   Difficult doing work/chores Somewhat difficult     Interpretation of Total Score  Total Score Depression Severity:  1-4 = Minimal depression, 5-9 = Mild depression, 10-14 = Moderate depression, 15-19 = Moderately severe depression, 20-27 = Severe  depression   Psychosocial Evaluation and Intervention: Psychosocial Evaluation - 07/07/17 1242      Psychosocial Evaluation & Interventions   Interventions  Encouraged to exercise with the program and follow exercise prescription;Relaxation education;Stress management education    Comments  Counselor met with Ms. Nyoka Cowden Shirlean Mylar) today for initial psychosocial evaluation.  She is a 57 year old who struggles with COPD.  Tangia has a strong support system with a significant other of 13 years; and (2) sisters and a brother who are local and involved.  Joci reports sleeping "okay" other than being in pain with her arthritis.  Others report she is very "restless" at night and her arms are moving often.  She has had several sleep studies and it has been determined she does not have sleep apnea.  Fiorella reports a history of depression and anxiety and has been on medications for this for quite some time.  She reports these are helping and her mood is stable at this time; other than sadness over the recent loss of her 59 year old dog.  Syona has goals to breathe and feel better over all; to lose weight and to generally have a better quality of life.  Staff will follow with Janeene throughout the course of this program.      Expected Outcomes  Faelyn will benefit from consistent exercise to achieve her stated goals.  She will meet with the dietician to address her weight loss goal.  Sharnita will also benefit from the educational and psychoeducational components of this program to learn more about her condition and discover more positive ways of coping in general.      Continue Psychosocial Services   Follow up required by staff       Psychosocial Re-Evaluation: Psychosocial Re-Evaluation    [place] Name 07/14/17 1158 07/25/17 1154           Psychosocial Re-Evaluation   Current issues with  Current Stress Concerns  Current Stress Concerns      Comments  Kysa is still stressed from losing her dog and other family  issues - nephew and sister.  Lorrin is starting to heal from losing her dog. She is enjoying coming to [facility] as a way to step away from work as well.       Expected Outcomes  Short - Kristinia is looking for another dog Long - Tariya will continue to exercise to get out of the house and deal with stress  Short: Lashane will continue to come to [place]; Long: Kia will find other activities besides LW to get out of the house.       Interventions  Encouraged to attend Pulmonary Rehabilitation for the exercise  Encouraged to attend Pulmonary Rehabilitation for the exercise      Continue Psychosocial Services   Follow up required by staff  -         Psychosocial Discharge (Final Psychosocial Re-Evaluation): Psychosocial Re-Evaluation - 07/25/17 1154      Psychosocial Re-Evaluation   Current issues with  Current Stress Concerns    Comments  Krishawna is starting to heal from losing her dog. She is enjoying coming to [facility] as a way to step away from work as well.     Expected Outcomes  Short: Zarria will continue to come to [place]; Long: Aaryn will find other activities besides LW to get out of the house.     Interventions  Encouraged to attend Pulmonary Rehabilitation for the exercise       Education: Education Goals: Education classes will be provided on a weekly basis, covering required topics. Participant will state understanding/return demonstration of topics presented.  Learning Barriers/Preferences: Learning Barriers/Preferences - 06/17/17 1523      Learning Barriers/Preferences   Learning Barriers  None    Learning Preferences  None       Education Topics:  Initial Evaluation Education: - Verbal, written and demonstration of respiratory meds, oximetry and breathing techniques. Instruction on use of nebulizers and MDIs and importance of monitoring MDI activations.   Pulmonary Rehab from 08/11/2017 in [facility] Cardiac and Pulmonary Rehab  Date  06/17/17  Educator  [facility]  Instruction Review Code  1- Verbalizes  Understanding  General Nutrition Guidelines/Fats and Fiber: -Group instruction provided by verbal, written material, models and posters to present the general guidelines for heart healthy nutrition. Gives an explanation and review of dietary fats and fiber.   Pulmonary Rehab from 08/11/2017 in [facility] Cardiac and Pulmonary Rehab  Date  08/04/17  Educator  CR  Instruction Review Code  1- Verbalizes Understanding      Controlling Sodium/Reading Food Labels: -Group verbal and written material supporting the discussion of sodium use in heart healthy nutrition. Review and explanation with models, verbal and written materials for utilization of the food label.   Pulmonary Rehab from 08/11/2017 in [facility] Cardiac and Pulmonary Rehab  Date  08/11/17  Educator  CR  Instruction Review Code  1- Verbalizes Understanding      Exercise Physiology & General Exercise Guidelines: - Group verbal and written instruction with models to review the exercise physiology of the cardiovascular system and associated critical values. Provides general exercise guidelines with specific guidelines to those with heart or lung disease.    Aerobic Exercise & Resistance Training: - Gives group verbal and written instruction on the various components of exercise. Focuses on aerobic and resistive training programs and the benefits of this training and how to safely progress through these programs.   Flexibility, Balance, Mind/Body Relaxation: Provides group verbal/written instruction on the benefits of flexibility and balance training, including mind/body exercise modes such as yoga, pilates and tai chi.  Demonstration and skill practice provided.   Pulmonary Rehab from 08/11/2017 in [facility] Cardiac and Pulmonary Rehab  Date  06/25/17  Educator  AS  Instruction Review Code  1- Verbalizes Understanding      Stress and Anxiety: - Provides group verbal and written instruction about the health risks of elevated stress and  causes of high stress.  Discuss the correlation between heart/lung disease and anxiety and treatment options. Review healthy ways to manage with stress and anxiety.   Depression: - Provides group verbal and written instruction on the correlation between heart/lung disease and depressed mood, treatment options, and the stigmas associated with seeking treatment.   Exercise & Equipment Safety: - Individual verbal instruction and demonstration of equipment use and safety with use of the equipment.   Pulmonary Rehab from 08/11/2017 in [facility] Cardiac and Pulmonary Rehab  Date  06/17/17  Educator  [facility]  Instruction Review Code  1- Verbalizes Understanding      Infection Prevention: - Provides verbal and written material to individual with discussion of infection control including proper hand washing and proper equipment cleaning during exercise session.   Pulmonary Rehab from 08/11/2017 in [facility] Cardiac and Pulmonary Rehab  Date  06/17/17  Educator  [facility]  Instruction Review Code  1- Verbalizes Understanding      Falls Prevention: - Provides verbal and written material to individual with discussion of falls prevention and safety.   Pulmonary Rehab from 08/11/2017 in [facility] Cardiac and Pulmonary Rehab  Date  06/17/17  Educator  [facility]  Instruction Review Code  1- Verbalizes Understanding      Diabetes: - Individual verbal and written instruction to review signs/symptoms of diabetes, desired ranges of glucose level fasting, after meals and with exercise. Advice that pre and post exercise glucose checks will be done for 3 sessions at entry of program.   Chronic Lung Diseases: - Group verbal and written instruction to review updates, respiratory medications, advancements in procedures and treatments. Discuss use of supplemental oxygen including available portable oxygen systems, continuous and intermittent flow rates, concentrators, personal  use and safety guidelines. Review proper use of inhaler and  spacers. Provide informative websites for self-education.    Energy Conservation: - Provide group verbal and written instruction for methods to conserve energy, plan and organize activities. Instruct on pacing techniques, use of adaptive equipment and posture/positioning to relieve shortness of breath.   Triggers and Exacerbations: - Group verbal and written instruction to review types of environmental triggers and ways to prevent exacerbations. Discuss weather changes, air quality and the benefits of nasal washing. Review warning signs and symptoms to help prevent infections. Discuss techniques for effective airway clearance, coughing, and vibrations.   AED/CPR: - Group verbal and written instruction with the use of models to demonstrate the basic use of the AED with the basic ABC's of resuscitation.   Pulmonary Rehab from 08/11/2017 in [facility] Cardiac and Pulmonary Rehab  Date  07/11/17  Educator  [facility]  Instruction Review Code  1- Actuary and Physiology of the Lungs: - Group verbal and written instruction with the use of models to provide basic lung anatomy and physiology related to function, structure and complications of lung disease.   Pulmonary Rehab from 08/11/2017 in [facility] Cardiac and Pulmonary Rehab  Date  07/23/17  Educator  [facility]  Instruction Review Code  1- Verbalizes Understanding      Anatomy & Physiology of the Heart: - Group verbal and written instruction and models provide basic cardiac anatomy and physiology, with the coronary electrical and arterial systems. Review of Valvular disease and Heart Failure   Cardiac Medications: - Group verbal and written instruction to review commonly prescribed medications for heart disease. Reviews the medication, class of the drug, and side effects.   Pulmonary Rehab from 08/11/2017 in [facility] Cardiac and Pulmonary Rehab  Date  06/27/17  Educator  [facility]  Instruction Review Code  1- Verbalizes Understanding       Know Your Numbers and Risk Factors: -Group verbal and written instruction about important numbers in your health.  Discussion of what are risk factors and how they play a role in the disease process.  Review of Cholesterol, Blood Pressure, Diabetes, and BMI and the role they play in your overall health.   Sleep Hygiene: -Provides group verbal and written instruction about how sleep can affect your health.  Define sleep hygiene, discuss sleep cycles and impact of sleep habits. Review good sleep hygiene tips.    Other: -Provides group and verbal instruction on various topics (see comments)    Knowledge Questionnaire Score: Knowledge Questionnaire Score - 06/17/17 1523      Knowledge Questionnaire Score   Pre Score  15/18 reviewed with patient        Core Components/Risk Factors/Patient Goals at Admission: Personal Goals and Risk Factors at Admission - 06/17/17 1526      Core Components/Risk Factors/Patient Goals on Admission    Weight Management  Yes;Obesity;Weight Loss    Intervention  Weight Management: Develop a combined nutrition and exercise program designed to reach desired caloric intake, while maintaining appropriate intake of nutrient and fiber, sodium and fats, and appropriate energy expenditure required for the weight goal.;Weight Management: Provide education and appropriate resources to help participant work on and attain dietary goals.;Weight Management/Obesity: Establish reasonable short term and long term weight goals.;Obesity: Provide education and appropriate resources to help participant work on and attain dietary goals.    Admit Weight  263 lb 3.2 oz (119.4 kg)    Goal Weight: Short Term  258 lb (117  kg)    Goal Weight: Long Term  160 lb (72.6 kg)    Expected Outcomes  Short Term: Continue to assess and modify interventions until short term weight is achieved;Long Term: Adherence to nutrition and physical activity/exercise program aimed toward attainment of  established weight goal;Weight Maintenance: Understanding of the daily nutrition guidelines, which includes 25-35% calories from fat, 7% or less cal from saturated fats, less than 275m cholesterol, less than 1.5gm of sodium, & 5 or more servings of fruits and vegetables daily;Weight Loss: Understanding of general recommendations for a balanced deficit meal plan, which promotes 1-2 lb weight loss per week and includes a negative energy balance of [phone] kcal/d;Understanding recommendations for meals to include 15-35% energy as protein, 25-35% energy from fat, 35-60% energy from carbohydrates, less than 2022mof dietary cholesterol, 20-35 gm of total fiber daily;Understanding of distribution of calorie intake throughout the day with the consumption of 4-5 meals/snacks    Tobacco Cessation  Yes    Number of packs per day  4 cigarettes a day    Intervention  Assist the participant in steps to quit. Provide individualized education and counseling about committing to Tobacco Cessation, relapse prevention, and pharmacological support that can be provided by physician.;OfAdvice workerassist with locating and accessing local/national Quit Smoking programs, and support quit date choice.    Expected Outcomes  Short Term: Will demonstrate readiness to quit, by selecting a quit date.;Long Term: Complete abstinence from all tobacco products for at least 12 months from quit date.;Short Term: Will quit all tobacco product use, adhering to prevention of relapse plan.    Improve shortness of breath with ADL's  Yes    Intervention  Provide education, individualized exercise plan and daily activity instruction to help decrease symptoms of SOB with activities of daily living.    Expected Outcomes  Short Term: Improve cardiorespiratory fitness to achieve a reduction of symptoms when performing ADLs;Long Term: Be able to perform more ADLs without symptoms or delay the onset of symptoms    Hypertension  Yes     Intervention  Provide education on lifestyle modifcations including regular physical activity/exercise, weight management, moderate sodium restriction and increased consumption of fresh fruit, vegetables, and low fat dairy, alcohol moderation, and smoking cessation.;Monitor prescription use compliance.    Expected Outcomes  Short Term: Continued assessment and intervention until BP is < 140/9056mG in hypertensive participants. < 130/50m45m in hypertensive participants with diabetes, heart failure or chronic kidney disease.;Long Term: Maintenance of blood pressure at goal levels.       Core Components/Risk Factors/Patient Goals Review:  Goals and Risk Factor Review    Row Name 07/14/17 1152 07/14/17 1247 07/25/17 1149         Core Components/Risk Factors/Patient Goals Review   Personal Goals Review  Tobacco Cessation;Improve shortness of breath with ADL's;Weight Management/Obesity  Tobacco Cessation  Weight Management/Obesity;Improve shortness of breath with ADL's;Tobacco Cessation     Review  RobiAniylahtes she is changing her diet to eat healthy - reducing portion sizes, especially sweets.  They are eating at home more often.  She is down to 3 cigarettes per day - one after each meal.  She state she has more energy since beginning to exercise.  She is checking her O2 and it was better today.  She is now taking wellbutrin.  Also gave her a fake cigarette to use to help stop smoking.  She also plans to use the patches if needed.   [place]  2 lb with the motivation to keep losing weight. Her shortness of breath has decreased since starting LW and she has noticed an improvement in her stamina. She is up to 5 cigs yesterday, but felt guilty about it and getting back to three a day and then quitting. She is very motivated to quit.      Expected Outcomes  Short - Shanyiah will schedule and meet with RD and continue to eat healthy  Long - Kseniya will reach first weight goal and continue to eat healthy   Short: Continue to work on quitting and use new fake cigarette.  Long: Continue to work towards cessation.   Short: continue on working on quitting smoking, come to [place] to work on SOB, stamina, and weight goals Long: continue to work on cessation and work on losing weight to get to her end goal of 160 lb.         Core Components/Risk Factors/Patient Goals at Discharge (Final Review):  Goals and Risk Factor Review - 07/25/17 1149      Core Components/Risk Factors/Patient Goals Review   Personal Goals Review  Weight Management/Obesity;Improve shortness of breath with ADL's;Tobacco Cessation    Review  Anisah is down 2 lb with the motivation to keep losing weight. Her shortness of breath has decreased since starting LW and she has noticed an improvement in her stamina. She is up to 5 cigs yesterday, but felt guilty about it and getting back to three a day and then quitting. She is very motivated to quit.     Expected Outcomes  Short: continue on working on quitting smoking, come to [place] to work on SOB, stamina, and weight goals Long: continue to work on cessation and work on losing weight to get to her end goal of 160 lb.        ITP Comments: ITP Comments    Row Name 06/17/17 1428 06/23/17 0935 07/21/17 0837 08/18/17 0900     ITP Comments  Medical Evaluation completed. Chart sent for review and changes to [name] Director of [place]. Diagnosis can be found in [facility] encounter 05/30/17  30 day review completed. ITP sent to [name] Director of [place]. Continue with ITP unless changes are made by physician.  30 day review completed. ITP sent to [name] Director of [place]. Continue with ITP unless changes are made by physician.   30 day review completed. ITP sent to [name] Director of [place]. Continue with ITP unless changes are made by physician       Comments: 30 day review

## 2017-08-20 ENCOUNTER — Encounter: Payer: BLUE CROSS/BLUE SHIELD | Admitting: *Deleted

## 2017-08-20 DIAGNOSIS — J439 Emphysema, unspecified: Secondary | ICD-10-CM | POA: Diagnosis not present

## 2017-08-20 NOTE — Progress Notes (Signed)
Daily Session Note  [name] MRN: [id number] Date of Birth: 03/04/1961 Referring Provider:     Pulmonary Rehab from 06/17/2017 in [facility]  Referring Provider  Berendzen, Susan MD      Encounter Date: 08/20/2017  Check In: Session Check In - 08/20/17 1117      Check-In   Location  [facility]    Staff Present  Meredith Craven, RN BSN;Jessica Hawkins, MA, RCEP, CCRP, [name];Joseph Hood RCP,[name],[name]    Supervising physician immediately available to respond to emergencies  [facility] immediately available ER MD    Physician(s)  Dr. Williams and Schaevitz    Medication changes reported      No    Fall or balance concerns reported     No    Tobacco Cessation  No Change    Warm-up and Cool-down  Performed as group-led instruction    Resistance Training Performed  Yes    VAD Patient?  No      Pain Assessment   Currently in Pain?  No/denies          Social History   Tobacco Use  Smoking Status Current Every Day Smoker  . Packs/day: 0.25  . Years: 25.00  . Pack years: 6.25  . Types: Cigarettes  Smokeless Tobacco Never Used  Tobacco Comment   given fake cigarette today, she is on wellbutrin and has patches on standby.    Goals Met:  Proper associated with RPD/PD & O2 Sat Independence with exercise equipment Using PLB without cueing & demonstrates good technique Exercise tolerated well Strength training completed today  Goals Unmet:  Not Applicable  Comments: Pt able to follow exercise prescription today without complaint.  Will continue to monitor for progression.    Dr. Mark Miller is Medical Director for [facility] and [facility]. 

## 2017-08-22 DIAGNOSIS — J439 Emphysema, unspecified: Secondary | ICD-10-CM

## 2017-08-22 NOTE — Progress Notes (Signed)
Daily Session Note  [name] MRN: [id number] Date of Birth: 1960/06/01 Referring Provider:     Pulmonary Rehab from 06/17/2017 in [facility]  Referring Provider  Tereasa Coop MD      Encounter Date: 08/22/2017  Check In: Session Check In - 08/22/17 1119      Check-In   Location  [facility]    Staff Present  [name],[name],[name];Meredith Sherryll Burger, RN BSN;Jessica Luan Pulling, MA, RCEP, CCRP, [name]    Supervising physician immediately available to respond to emergencies  [facility] immediately available ER MD    Physician(s)  Discharge ITP sent and signed by Dr. Sabra Heck.  Discharge Summary routed to PCP and cardiologist.    Medication changes reported      No    Fall or balance concerns reported     No    Tobacco Cessation  No Change    Warm-up and Cool-down  Performed as group-led instruction    Resistance Training Performed  Yes    VAD Patient?  No      Pain Assessment   Currently in Pain?  No/denies          Social History   Tobacco Use  Smoking Status Current Every Day Smoker  . Packs/day: 0.25  . Years: 25.00  . Pack years: 6.25  . Types: Cigarettes  Smokeless Tobacco Never Used  Tobacco Comment   given fake cigarette today, she is on wellbutrin and has patches on standby.    Goals Met:  Independence with exercise equipment Exercise tolerated well No report of cardiac concerns or symptoms Strength training completed today  Goals Unmet:  Not Applicable  Comments: Pt able to follow exercise prescription today without complaint.  Will continue to monitor for progression.   [name] is Medical Director for [facility] and [facility].

## 2017-08-27 ENCOUNTER — Telehealth: Payer: Self-pay

## 2017-08-27 NOTE — Telephone Encounter (Signed)
Dana Hill left a message that she had minor leg surgery yesterday and cannot attend today.  She plans to return Friday or Monday if Dr approves

## 2017-09-01 DIAGNOSIS — J439 Emphysema, unspecified: Secondary | ICD-10-CM | POA: Diagnosis not present

## 2017-09-01 NOTE — Progress Notes (Signed)
Daily Session Note  [name] MRN: [id number] Date of Birth: December 29, 1960 Referring Provider:     Pulmonary Rehab from 06/17/2017 in [facility]  Referring Provider  Tereasa Coop MD      Encounter Date: 09/01/2017  Check In: Session Check In - 09/01/17 1137      Check-In   Location  [facility]    Staff Present  Justin Mend Jaci Carrel, BS, [name], [name];Mandi Zachery Conch, BS, [facility]    Supervising physician immediately available to respond to emergencies  [facility] immediately available ER MD    Physician(s)  Dr. Reita Cliche and Cinda Quest    Medication changes reported      No    Fall or balance concerns reported     No    Tobacco Cessation  No Change    Warm-up and Cool-down  Performed as group-led instruction    Resistance Training Performed  Yes    VAD Patient?  No      Pain Assessment   Currently in Pain?  No/denies          Social History   Tobacco Use  Smoking Status Current Every Day Smoker  . Packs/day: 0.25  . Years: 25.00  . Pack years: 6.25  . Types: Cigarettes  Smokeless Tobacco Never Used  Tobacco Comment   given fake cigarette today, she is on wellbutrin and has patches on standby.    Goals Met:  Independence with exercise equipment Exercise tolerated well No report of cardiac concerns or symptoms Strength training completed today  Goals Unmet:  Not Applicable  Comments: Pt able to follow exercise prescription today without complaint.  Will continue to monitor for progression.   [name] is Medical Director for [facility] and [facility].

## 2017-09-10 ENCOUNTER — Encounter: Payer: BLUE CROSS/BLUE SHIELD | Attending: Internal Medicine

## 2017-09-10 DIAGNOSIS — J439 Emphysema, unspecified: Secondary | ICD-10-CM | POA: Insufficient documentation

## 2017-09-10 NOTE — Progress Notes (Signed)
Daily Session Note  [name] MRN: [id number] Date of Birth: 10/06/1960 Referring Provider:     Pulmonary Rehab from 06/17/2017 in [facility] Re[facility]  Referring Provider  Tereasa Coop MD      Encounter Date: 09/10/2017  Check In: Session Check In - 09/10/17 1124      Check-In   Location  [facility]    Staff Present  [name],[name],[name];Meredith Sherryll Burger, RN BSN;Jessica Luan Pulling, MA, RCEP, CCRP, [name]    Supervising physician immediately available to respond to emergencies  [facility] immediately available ER MD    Physician(s)  Dr. [name] and Alfred Levins    Medication changes reported      No    Fall or balance concerns reported     No    Tobacco Cessation  No Change    Warm-up and Cool-down  Performed as group-led instruction    Resistance Training Performed  Yes    VAD Patient?  No      Pain Assessment   Currently in Pain?  No/denies          Social History   Tobacco Use  Smoking Status Current Every Day Smoker  . Packs/day: 0.25  . Years: 25.00  . Pack years: 6.25  . Types: Cigarettes  Smokeless Tobacco Never Used  Tobacco Comment   given fake cigarette today, she is on wellbutrin and has patches on standby.    Goals Met:  Independence with exercise equipment Exercise tolerated well No report of cardiac concerns or symptoms Strength training completed today  Goals Unmet:  Not Applicable  Comments: Pt able to follow exercise prescription today without complaint.  Will continue to monitor for progression.   [name] is Medical Director for [facility] and [facility].

## 2017-09-15 DIAGNOSIS — J439 Emphysema, unspecified: Secondary | ICD-10-CM

## 2017-09-15 NOTE — Progress Notes (Signed)
Pulmonary Individual Treatment Plan  [name] MRN: [id number] Date of Birth: 24-Sep-1960 Referring Provider:     Pulmonary Rehab from 06/17/2017 in [facility]  Referring Provider  Tereasa Coop MD      Initial Encounter Date:    Pulmonary Rehab from 06/17/2017 in [facility] Re[facility]  Date  06/17/17  Referring Provider  Tereasa Coop MD      Visit Diagnosis: Pulmonary emphysema, unspecified emphysema type ([place])  Patient's Home Medications on Admission:  Current Outpatient Medications:  .  albuterol (PROVENTIL HFA;VENTOLIN HFA) 108 (90 Base) MCG/ACT inhaler, Inhale 2 puffs into the lungs every 6 (six) hours as needed for wheezing or shortness of breath., Disp: , Rfl:  .  budesonide-formoterol (SYMBICORT) 160-4.5 MCG/ACT inhaler, Inhale 2 puffs into the lungs 2 (two) times daily., Disp: , Rfl:  .  buprenorphine-naloxone (SUBOXONE) 8-2 MG SUBL SL tablet, Place 1 tablet under the tongue 2 (two) times daily., Disp: , Rfl:  .  citalopram (CELEXA) 40 MG tablet, Take 40 mg by mouth daily., Disp: , Rfl:  .  estradiol (VIVELLE-DOT) 0.0375 MG/24HR, Place 1 patch onto the skin 2 (two) times a week., Disp: , Rfl:  .  fexofenadine (ALLEGRA) 180 MG tablet, Take 180 mg by mouth daily as needed for allergies or rhinitis., Disp: , Rfl:  .  hydrochlorothiazide (HYDRODIURIL) 25 MG tablet, Take 25 mg by mouth daily., Disp: , Rfl:  .  hydrOXYzine (ATARAX/VISTARIL) 25 MG tablet, Take 25 mg by mouth daily as needed for itching., Disp: , Rfl:  .  ibuprofen (ADVIL,MOTRIN) 800 MG tablet, Take 800 mg by mouth 2 (two) times daily as needed., Disp: , Rfl:  .  lamoTRIgine (LAMICTAL) 25 MG tablet, Take 25 mg by mouth 2 (two) times daily., Disp: , Rfl:  .  lisinopril (PRINIVIL,ZESTRIL) 40 MG tablet, Take 40 mg by mouth daily., Disp: , Rfl:  .  LORazepam (ATIVAN) 1 MG tablet, Take 1 mg by mouth 2 (two) times daily as needed for anxiety., Disp: , Rfl:  .   omeprazole (PRILOSEC) 20 MG capsule, Take 20 mg by mouth daily., Disp: , Rfl:  .  progesterone (PROMETRIUM) 100 MG capsule, Take 100 mg by mouth at bedtime., Disp: , Rfl:  .  Secukinumab (COSENTYX 300 DOSE) 150 MG/ML SOSY, Inject into the skin every 30 (thirty) days. Last dose 04/02/16, Disp: , Rfl:  .  spironolactone (ALDACTONE) 25 MG tablet, Take 25 mg by mouth daily., Disp: , Rfl:   Past Medical History: Past Medical History:  Diagnosis Date  . Asthma   . COPD (chronic obstructive pulmonary disease) ([place])   . Degenerative disc disease, cervical   . Depression   . GERD (gastroesophageal reflux disease)   . Hypertension   . Osteoarthritis   . Psoriatic arthritis ([place])   . Wears contact lenses    right eye    Tobacco Use: Social History   Tobacco Use  Smoking Status Current Every Day Smoker  . Packs/day: 0.25  . Years: 25.00  . Pack years: 6.25  . Types: Cigarettes  Smokeless Tobacco Never Used  Tobacco Comment   given fake cigarette today, she is on wellbutrin and has patches on standby.    Labs: Recent Review Flowsheet Data    There is no flowsheet data to display.       Pulmonary Assessment Scores: Pulmonary Assessment Scores    Row Name 06/17/17 [id number]  ADL UCSD   ADL Phase  Entry     SOB Score total  44     Rest  0     Walk  2     Stairs  4     Bath  2     Dress  1     Shop  3       CAT Score   CAT Score  21       mMRC Score   mMRC Score  3        Pulmonary Function Assessment: Pulmonary Function Assessment - 06/17/17 1522      Breath   Bilateral Breath Sounds  Wheezes    Shortness of Breath  Yes;Fear of Shortness of Breath;Limiting activity       Exercise Target Goals:    Exercise Program Goal: Individual exercise prescription set using results from initial 6 min walk test and THRR while considering  patient's activity barriers and safety.    Exercise Prescription Goal: Initial exercise prescription builds to 30-45 minutes a  day of aerobic activity, 2-3 days per week.  Home exercise guidelines will be given to patient during program as part of exercise prescription that the participant will acknowledge.  Activity Barriers & Risk Stratification: Activity Barriers & Cardiac Risk Stratification - 06/17/17 1623      Activity Barriers & Cardiac Risk Stratification   Activity Barriers  Arthritis;Joint Problems;History of Falls;Muscular Weakness;Deconditioning;Shortness of Breath soriatic arthritis, knee pain       6 Minute Walk: 6 Minute Walk    Row Name 06/17/17 1620         6 Minute Walk   Phase  Initial     Distance  894 feet     Walk Time  6 minutes     # of Rest Breaks  0     MPH  1.69     METS  2.17     RPE  13     Perceived Dyspnea   2     VO2 Peak  7.6     Symptoms  Yes (comment)     Comments  SOB, hip pain 5-6/10, back pain 3/10     Resting HR  69 bpm     Resting BP  136/74     Resting Oxygen Saturation   93 % 88% on Room Air     Exercise Oxygen Saturation  during 6 min walk  88 %     Max Ex. HR  127 bpm     Max Ex. BP  146/84     2 Minute Post BP  134/76       Interval HR   1 Minute HR  86     2 Minute HR  111     3 Minute HR  107     4 Minute HR  127     5 Minute HR  108     6 Minute HR  112     2 Minute Post HR  75     Interval Heart Rate?  Yes       Interval Oxygen   Interval Oxygen?  Yes     Baseline Oxygen Saturation %  93 %     1 Minute Oxygen Saturation %  89 %     1 Minute Liters of Oxygen  3 L     2 Minute Oxygen Saturation %  88 %     2 Minute Liters of Oxygen  3  L     3 Minute Oxygen Saturation %  89 %     3 Minute Liters of Oxygen  3 L     4 Minute Oxygen Saturation %  88 %     4 Minute Liters of Oxygen  3 L     5 Minute Oxygen Saturation %  89 %     5 Minute Liters of Oxygen  3 L     6 Minute Oxygen Saturation %  88 %     6 Minute Liters of Oxygen  3 L     2 Minute Post Oxygen Saturation %  96 %     2 Minute Post Liters of Oxygen  3 L       Oxygen Initial  Assessment: Oxygen Initial Assessment - 06/17/17 1523      Home Oxygen   Home Oxygen Device  E-Tanks she is just now getting home oxygen and needs to be evaluated    Sleep Oxygen Prescription  None    Home Exercise Oxygen Prescription  None    Home at Rest Exercise Oxygen Prescription  None      Initial 6 min Walk   Oxygen Used  Continuous;E-Tanks    Liters per minute  3      Program Oxygen Prescription   Program Oxygen Prescription  Continuous;E-Tanks    Liters per minute  3    Comments  patient is getting home oxygen and is suppose to use 3 liters.       Intervention   Short Term Goals  To learn and exhibit compliance with exercise, home and travel O2 prescription;To learn and understand importance of maintaining oxygen saturations>88%;To learn and demonstrate proper use of respiratory medications;To learn and demonstrate proper pursed lip breathing techniques or other breathing techniques.;To learn and understand importance of monitoring SPO2 with pulse oximeter and demonstrate accurate use of the pulse oximeter. inhaler at home. Albuterol prn    Long  Term Goals  Exhibits compliance with exercise, home and travel O2 prescription;Verbalizes importance of monitoring SPO2 with pulse oximeter and return demonstration;Maintenance of O2 saturations>88%;Exhibits proper breathing techniques, such as pursed lip breathing or other method taught during program session;Compliance with respiratory medication;Demonstrates proper use of MDI's       Oxygen Re-Evaluation: Oxygen Re-Evaluation    Row Name 06/25/17 1125 07/25/17 1158 09/10/17 1235         Program Oxygen Prescription   Program Oxygen Prescription  Continuous;E-Tanks  Continuous;E-Tanks  Continuous;E-Tanks     Liters per minute  '3  3  3     '$ Comments  patient is getting home oxygen and is suppose to use 3 liters.   -  patient is getting home oxygen and is suppose to use 3 liters.        Home Oxygen   Home Oxygen Device  E-Tanks   E-Tanks  E-Tanks     Sleep Oxygen Prescription  -  None  None     Home Exercise Oxygen Prescription  None  None  None     Home at Rest Exercise Oxygen Prescription  None  None  None     Compliance with Home Oxygen Use  Yes  Yes  Yes       Goals/Expected Outcomes   Short Term Goals  To learn and exhibit compliance with exercise, home and travel O2 prescription;To learn and understand importance of maintaining oxygen saturations>88%;To learn and demonstrate proper use of respiratory medications;To learn and demonstrate proper pursed  lip breathing techniques or other breathing techniques.;To learn and understand importance of monitoring SPO2 with pulse oximeter and demonstrate accurate use of the pulse oximeter.  To learn and exhibit compliance with exercise, home and travel O2 prescription;To learn and understand importance of maintaining oxygen saturations>88%;To learn and demonstrate proper use of respiratory medications;To learn and demonstrate proper pursed lip breathing techniques or other breathing techniques.;To learn and understand importance of monitoring SPO2 with pulse oximeter and demonstrate accurate use of the pulse oximeter.  To learn and exhibit compliance with exercise, home and travel O2 prescription;To learn and understand importance of maintaining oxygen saturations>88%;To learn and demonstrate proper use of respiratory medications;To learn and demonstrate proper pursed lip breathing techniques or other breathing techniques.;To learn and understand importance of monitoring SPO2 with pulse oximeter and demonstrate accurate use of the pulse oximeter.     Long  Term Goals  Exhibits compliance with exercise, home and travel O2 prescription;Verbalizes importance of monitoring SPO2 with pulse oximeter and return demonstration;Maintenance of O2 saturations>88%;Exhibits proper breathing techniques, such as pursed lip breathing or other method taught during program session;Compliance with  respiratory medication;Demonstrates proper use of MDI's  Exhibits compliance with exercise, home and travel O2 prescription;Verbalizes importance of monitoring SPO2 with pulse oximeter and return demonstration;Maintenance of O2 saturations>88%;Exhibits proper breathing techniques, such as pursed lip breathing or other method taught during program session;Compliance with respiratory medication;Demonstrates proper use of MDI's  Exhibits compliance with exercise, home and travel O2 prescription;Verbalizes importance of monitoring SPO2 with pulse oximeter and return demonstration;Maintenance of O2 saturations>88%;Exhibits proper breathing techniques, such as pursed lip breathing or other method taught during program session;Compliance with respiratory medication;Demonstrates proper use of MDI's     Comments  Reviewed PLB technique with pt.  Talked about how it work and it's important to maintaining his exercise saturations.    Elonna is using her oxygen correctly and managing it well. She is also continuing to use PLB here and outside of LW. Her medications are being used properly.   Yessenia is continuing to use her oxygen appropriately. She feels more confident in managing it independently. She is practicing PLB when needed. She reports not having to use her rescue inhaler as much, and that her new daily inhaler is working well     Goals/Expected Outcomes  Short: Become more profiecient at using PLB.   Long: Become independent at using PLB.  Short: continue to work on PLB. Long: continue to manage oxygen and respiratory medication.   Short: continue to work on PLB and use oxygen appropirately. Long: become indpenedent with PLB         Oxygen Discharge (Final Oxygen Re-Evaluation): Oxygen Re-Evaluation - 09/10/17 1235      Program Oxygen Prescription   Program Oxygen Prescription  Continuous;E-Tanks    Liters per minute  3    Comments  patient is getting home oxygen and is suppose to use 3 liters.        Home Oxygen   Home Oxygen Device  E-Tanks    Sleep Oxygen Prescription  None    Home Exercise Oxygen Prescription  None    Home at Rest Exercise Oxygen Prescription  None    Compliance with Home Oxygen Use  Yes      Goals/Expected Outcomes   Short Term Goals  To learn and exhibit compliance with exercise, home and travel O2 prescription;To learn and understand importance of maintaining oxygen saturations>88%;To learn and demonstrate proper use of respiratory medications;To learn and demonstrate proper pursed lip  breathing techniques or other breathing techniques.;To learn and understand importance of monitoring SPO2 with pulse oximeter and demonstrate accurate use of the pulse oximeter.    Long  Term Goals  Exhibits compliance with exercise, home and travel O2 prescription;Verbalizes importance of monitoring SPO2 with pulse oximeter and return demonstration;Maintenance of O2 saturations>88%;Exhibits proper breathing techniques, such as pursed lip breathing or other method taught during program session;Compliance with respiratory medication;Demonstrates proper use of MDI's    Comments  Morenike is continuing to use her oxygen appropriately. She feels more confident in managing it independently. She is practicing PLB when needed. She reports not having to use her rescue inhaler as much, and that her new daily inhaler is working well    Goals/Expected Outcomes  Short: continue to work on PLB and use oxygen appropirately. Long: become indpenedent with PLB        Initial Exercise Prescription: Initial Exercise Prescription - 06/17/17 1600      Date of Initial Exercise RX and Referring Provider   Date  06/17/17    Referring Provider  Tereasa Coop MD      Oxygen   Oxygen  Continuous    Liters  3-4 4L on treadmill      Treadmill   MPH  1.5    Grade  0.5    Minutes  15    METs  2.25      Recumbant Elliptical   Level  1    RPM  50    Minutes  15    METs  2.1      T5 Nustep   Level  1     SPM  80    Minutes  15    METs  2.1      Prescription Details   Frequency (times per week)  3    Duration  Progress to 45 minutes of aerobic exercise without signs/symptoms of physical distress      Intensity   THRR 40-80% of Max Heartrate  106-143    Ratings of Perceived Exertion  11-13    Perceived Dyspnea  0-4      Progression   Progression  Continue to progress workloads to maintain intensity without signs/symptoms of physical distress.      Resistance Training   Training Prescription  Yes    Weight  3 lbs    Reps  10-15       Perform Capillary Blood Glucose checks as needed.  Exercise Prescription Changes: Exercise Prescription Changes    Row Name 06/17/17 1600 07/07/17 1600 07/18/17 1200 07/22/17 1400 08/05/17 1500     Response to Exercise   Blood Pressure (Admit)  136/74  130/70  -  124/76  124/80   Blood Pressure (Exercise)  146/84  -  -  -  -   Blood Pressure (Exit)  134/76  128/78  -  130/80  116/74   Heart Rate (Admit)  69 bpm  81 bpm  -  80 bpm  80 bpm   Heart Rate (Exercise)  127 bpm  96 bpm  -  89 bpm  85 bpm   Heart Rate (Exit)  75 bpm  73 bpm  -  72 bpm  71 bpm   Oxygen Saturation (Admit)  93 %  88 %  -  92 %  91 %   Oxygen Saturation (Exercise)  99 %  88 %  -  91 %  91 %   Oxygen Saturation (Exit)  96 %  95 %  -  96 %  94 %   Rating of Perceived Exertion (Exercise)  13  13  -  13  13   Perceived Dyspnea (Exercise)  2  1  -  0  0   Symptoms  hip pain 6/10, back pain 3/10, SOB  none  -  none  none   Comments  walk test results  -  -  -  -   Duration  -  Continue with 45 min of aerobic exercise without signs/symptoms of physical distress.  -  Continue with 45 min of aerobic exercise without signs/symptoms of physical distress.  Continue with 45 min of aerobic exercise without signs/symptoms of physical distress.   Intensity  -  THRR unchanged  -  THRR unchanged  THRR unchanged     Progression   Progression  -  Continue to progress workloads to maintain  intensity without signs/symptoms of physical distress.  -  Continue to progress workloads to maintain intensity without signs/symptoms of physical distress.  Continue to progress workloads to maintain intensity without signs/symptoms of physical distress.   Average METs  -  2.73  -  1.95  2.57     Resistance Training   Training Prescription  -  Yes  -  Yes  Yes   Weight  -  3 lbs  -  3 lbs  4 lbs   Reps  -  10-15  -  10-15  10-15     Interval Training   Interval Training  -  No  -  No  No     Oxygen   Oxygen  -  Continuous  -  Continuous  Continuous   Liters  -  2-3 3L on treadmill  -  3  2     Treadmill   MPH  -  1.5  -  1.5  2   Grade  -  0.5  -  0.5  1   Minutes  -  15  -  15  15   METs  -  2.25  -  2.25  2.81     Recumbant Elliptical   Level  -  1  -  2  2   RPM  -  39  -  33  50   Minutes  -  15  -  15  15   METs  -  1.3  -  1.6  2     T5 Nustep   Level  -  1  -  2  3   SPM  -  100  -  121  97   Minutes  -  15  -  15  15   METs  -  1.9  -  2  2.9     Home Exercise Plan   Plans to continue exercise at  -  -  Home (comment) gym in complex, treadmill, stairmill, walking  Home (comment) gym in complex, treadmill, stairmill, walking  Home (comment) gym in complex, treadmill, stairmill, walking   Frequency  -  -  Add 1 additional day to program exercise sessions.  Add 1 additional day to program exercise sessions.  Add 1 additional day to program exercise sessions.   Initial Home Exercises Provided  -  -  07/18/17  07/18/17  07/18/17   Row Name 08/18/17 1500 09/02/17 1400           Response to Exercise   Blood Pressure (Admit)  130/74  132/82  Blood Pressure (Exit)  122/78  122/76      Heart Rate (Admit)  77 bpm  83 bpm      Heart Rate (Exercise)  89 bpm  97 bpm      Heart Rate (Exit)  93 bpm  73 bpm      Oxygen Saturation (Admit)  94 %  95 %      Oxygen Saturation (Exercise)  89 %  90 %      Oxygen Saturation (Exit)  95 %  96 %      Rating of Perceived Exertion  (Exercise)  13  15      Perceived Dyspnea (Exercise)  0  2      Symptoms  none  none      Duration  Continue with 45 min of aerobic exercise without signs/symptoms of physical distress.  Continue with 45 min of aerobic exercise without signs/symptoms of physical distress.      Intensity  THRR unchanged  THRR unchanged        Progression   Progression  Continue to progress workloads to maintain intensity without signs/symptoms of physical distress.  Continue to progress workloads to maintain intensity without signs/symptoms of physical distress.      Average METs  2.23  2.46        Resistance Training   Training Prescription  Yes  Yes      Weight  4 lbs  4 lbs      Reps  10-15  10-15        Interval Training   Interval Training  No  -        Oxygen   Oxygen  Continuous  Continuous      Liters  2  2        Treadmill   MPH  1.8  2      Grade  0.5  0.5      Minutes  15  15      METs  2.5  2.67        Recumbant Elliptical   Level  3  4      RPM  44  46      Minutes  15  15      METs  2.1  2.6        T5 Nustep   Level  3  4      SPM  101  91      Minutes  15  15      METs  2.1  2.1        Home Exercise Plan   Plans to continue exercise at  Home (comment) gym in complex, treadmill, stairmill, walking  Home (comment) gym in complex, treadmill, stairmill, walking      Frequency  Add 1 additional day to program exercise sessions.  Add 1 additional day to program exercise sessions.      Initial Home Exercises Provided  07/18/17  07/18/17         Exercise Comments: Exercise Comments    Row Name 06/25/17 1124           Exercise Comments  First full day of exercise!  Patient was oriented to gym and equipment including functions, settings, policies, and procedures.  Patient's individual exercise prescription and treatment plan were reviewed.  All starting workloads were established based on the results of the 6 minute walk test done at initial orientation visit.  The plan for  exercise progression was  also introduced and progression will be customized based on patient's performance and goals.          Exercise Goals and Review: Exercise Goals    Row Name 06/17/17 1628             Exercise Goals   Increase Physical Activity  Yes       Intervention  Provide advice, education, support and counseling about physical activity/exercise needs.;Develop an individualized exercise prescription for aerobic and resistive training based on initial evaluation findings, risk stratification, comorbidities and participant's personal goals.       Expected Outcomes  Long Term: Add in home exercise to make exercise part of routine and to increase amount of physical activity.;Short Term: Attend rehab on a regular basis to increase amount of physical activity.;Long Term: Exercising regularly at least 3-5 days a week.       Increase Strength and Stamina  Yes       Intervention  Provide advice, education, support and counseling about physical activity/exercise needs.;Develop an individualized exercise prescription for aerobic and resistive training based on initial evaluation findings, risk stratification, comorbidities and participant's personal goals.       Expected Outcomes  Short Term: Increase workloads from initial exercise prescription for resistance, speed, and METs.;Short Term: Perform resistance training exercises routinely during rehab and add in resistance training at home;Long Term: Improve cardiorespiratory fitness, muscular endurance and strength as measured by increased METs and functional capacity (6MWT)       Able to understand and use rate of perceived exertion (RPE) scale  Yes       Intervention  Provide education and explanation on how to use RPE scale       Expected Outcomes  Short Term: Able to use RPE daily in rehab to express subjective intensity level;Long Term:  Able to use RPE to guide intensity level when exercising independently       Able to understand and use  Dyspnea scale  Yes       Intervention  Provide education and explanation on how to use Dyspnea scale       Expected Outcomes  Short Term: Able to use Dyspnea scale daily in rehab to express subjective sense of shortness of breath during exertion;Long Term: Able to use Dyspnea scale to guide intensity level when exercising independently       Knowledge and understanding of Target Heart Rate Range (THRR)  Yes       Intervention  Provide education and explanation of THRR including how the numbers were predicted and where they are located for reference       Expected Outcomes  Short Term: Able to state/look up THRR;Short Term: Able to use daily as guideline for intensity in rehab;Long Term: Able to use THRR to govern intensity when exercising independently       Able to check pulse independently  Yes       Intervention  Provide education and demonstration on how to check pulse in carotid and radial arteries.;Review the importance of being able to check your own pulse for safety during independent exercise       Expected Outcomes  Short Term: Able to explain why pulse checking is important during independent exercise;Long Term: Able to check pulse independently and accurately       Understanding of Exercise Prescription  Yes       Intervention  Provide education, explanation, and written materials on patient's individual exercise prescription       Expected Outcomes  Short Term: Able to explain program exercise prescription;Long Term: Able to explain home exercise prescription to exercise independently          Exercise Goals Re-Evaluation : Exercise Goals Re-Evaluation    Row Name 06/25/17 1124 07/07/17 1621 07/18/17 1219 07/25/17 1156 08/05/17 1503     Exercise Goal Re-Evaluation   Exercise Goals Review  Understanding of Exercise Prescription;Able to understand and use Dyspnea scale;Knowledge and understanding of Target Heart Rate Range (THRR);Able to understand and use rate of perceived exertion  (RPE) scale  Increase Physical Activity;Understanding of Exercise Prescription;Increase Strength and Stamina  Increase Physical Activity;Understanding of Exercise Prescription;Increase Strength and Stamina;Able to understand and use Dyspnea scale;Knowledge and understanding of Target Heart Rate Range (THRR);Able to understand and use rate of perceived exertion (RPE) scale;Able to check pulse independently  Increase Physical Activity;Understanding of Exercise Prescription;Increase Strength and Stamina;Able to understand and use Dyspnea scale;Knowledge and understanding of Target Heart Rate Range (THRR);Able to understand and use rate of perceived exertion (RPE) scale;Able to check pulse independently  Increase Physical Activity;Understanding of Exercise Prescription;Increase Strength and Stamina   Comments  Reviewed RPE scale, THR and program prescription with pt today.  Pt voiced understanding and was given a copy of goals to take home.   [place] has been doing well in rehab.  She is now using 3L on the treadmill to help improve her saturations.  We will continue to monitor her progress.   Reviewed home exercise with pt today.  Pt plans to walk and go to gym in complex for exercise.  She will have access to a treadmill and stairmill. Reviewed THR, pulse, RPE, sign and symptoms, and when to call 911 or MD.  Also discussed weather considerations and indoor options.  Pt voiced understanding.  Rainey reviewed what was said during home exercise last week. She said she went to the gym at her complex and plans to go tomorrow to add another day of exercise  Peightyn has been doing well in rehab.  She is now up to 2.0 mph on the treadmill and level 3 on the T5 NuStep.  We will continue to monitor her progression.     Expected Outcomes  Short: Use RPE daily to regulate intensity.  Long: Follow program prescription in THR.  Short: Continue to attend regularly.  Long: Continue to work on IT sales professional.   Short: Start going  to gym 1 extra day a week.  Long: Exercise more on off days from rehab.   Short: continue to exercise one extra day outside of LW. Long: exercise independently.   Short: Continue to add in her home exercise at least one extra day a week.  Long: Continue to build strength and stamina.    [place] Name 08/18/17 1536 09/02/17 1423           Exercise Goal Re-Evaluation   Exercise Goals Review  Increase Physical Activity;Understanding of Exercise Prescription;Increase Strength and Stamina  Increase Physical Activity;Understanding of Exercise Prescription;Increase Strength and Stamina      Comments  Bruce continues to do well in rehab.  She had done level 4 on T5 NuStep but went back to level 3 and went back down to 1.65mh on the treadmill.  We will talk to her about these at next visit. We will continue to monitor her progression.  RThurleycontinues to do well in rehab.  She had done level 4 on T5 NuStep but went back to level 3 and went back down to 1.837m on  the treadmill.  We will talk to her about these at next visit. We will continue to monitor her progression.      Expected Outcomes   Short: Return workloads back to where they should be.  Long: Continue to work on IT sales professional.   Short: Continue to work on increasing METs. Long: Continue to build strength and stamina.          Discharge Exercise Prescription (Final Exercise Prescription Changes): Exercise Prescription Changes - 09/02/17 1400      Response to Exercise   Blood Pressure (Admit)  132/82    Blood Pressure (Exit)  122/76    Heart Rate (Admit)  83 bpm    Heart Rate (Exercise)  97 bpm    Heart Rate (Exit)  73 bpm    Oxygen Saturation (Admit)  95 %    Oxygen Saturation (Exercise)  90 %    Oxygen Saturation (Exit)  96 %    Rating of Perceived Exertion (Exercise)  15    Perceived Dyspnea (Exercise)  2    Symptoms  none    Duration  Continue with 45 min of aerobic exercise without signs/symptoms of physical distress.    Intensity   THRR unchanged      Progression   Progression  Continue to progress workloads to maintain intensity without signs/symptoms of physical distress.    Average METs  2.46      Resistance Training   Training Prescription  Yes    Weight  4 lbs    Reps  10-15      Oxygen   Oxygen  Continuous    Liters  2      Treadmill   MPH  2    Grade  0.5    Minutes  15    METs  2.67      Recumbant Elliptical   Level  4    RPM  46    Minutes  15    METs  2.6      T5 Nustep   Level  4    SPM  91    Minutes  15    METs  2.1      Home Exercise Plan   Plans to continue exercise at  Home (comment) gym in complex, treadmill, stairmill, walking    Frequency  Add 1 additional day to program exercise sessions.    Initial Home Exercises Provided  07/18/17       Nutrition:  Target Goals: Understanding of nutrition guidelines, daily intake of sodium '1500mg'$ , cholesterol '200mg'$ , calories 30% from fat and 7% or less from saturated fats, daily to have 5 or more servings of fruits and vegetables.  Biometrics: Pre Biometrics - 06/17/17 1629      Pre Biometrics   Height  5' 3.5" (1.613 m)    Weight  263 lb 3.2 oz (119.4 kg)    Waist Circumference  46 inches    Hip Circumference  59.25 inches    Waist to Hip Ratio  0.78 %    BMI (Calculated)  45.89        Nutrition Therapy Plan and Nutrition Goals: Nutrition Therapy & Goals - 06/17/17 1520      Personal Nutrition Goals   Comments  weight loss, eating healthier and learning about healthier options.      Intervention Plan   Intervention  Prescribe, educate and counsel regarding individualized specific dietary modifications aiming towards targeted core components such as weight, hypertension, lipid management, diabetes,  heart failure and other comorbidities.;Nutrition handout(s) given to patient.    Expected Outcomes  Short Term Goal: Understand basic principles of dietary content, such as calories, fat, sodium, cholesterol and nutrients.;Short  Term Goal: A plan has been developed with personal nutrition goals set during dietitian appointment.;Long Term Goal: Adherence to prescribed nutrition plan.       Nutrition Assessments: Nutrition Assessments - 06/17/17 1520      MEDFICTS Scores   Pre Score  62       Nutrition Goals Re-Evaluation: Nutrition Goals Re-Evaluation    Row Name 07/14/17 1157 07/25/17 1153 09/10/17 1228         Goals   Current Weight  264 lb (119.7 kg)  261 lb (118.4 kg)  262 lb (118.8 kg)     Comment  Magon will schedule with RD and folow her recommendations  Michayla had to miss her initial appointment with the RD due to home issues, but has a second appointment the end of this month. She reported not eating out as much and watching her portion sizes.   Lexey did not want to reschedule her nutrition appointment, but knows she can change her mind at any point. She reports that she is being more mindful of food labels and cooking healthier options at home     Expected Outcome  Short - Gerarda will meet with RD  Long - Dakiya will continue to lose weight  Short: Lagena will meet with the dietician. Long: Reneisha will work on eating healthier and get to her goal weight.   Short: Myrlene will attend the General Nutrition class in [facility]. Long: Chrisha will become independent in eaiting a healthy diet and reach her goal weight.         Nutrition Goals Discharge (Final Nutrition Goals Re-Evaluation): Nutrition Goals Re-Evaluation - 09/10/17 1228      Goals   Current Weight  262 lb (118.8 kg)    Comment  Alzada did not want to reschedule her nutrition appointment, but knows she can change her mind at any point. She reports that she is being more mindful of food labels and cooking healthier options at home    Expected Outcome  Short: Jahniah will attend the General Nutrition class in [place]. Long: Kallen will become independent in eaiting a healthy diet and reach her goal weight.        Psychosocial: Target Goals:  Acknowledge presence or absence of significant depression and/or stress, maximize coping skills, provide positive support system. Participant is able to verbalize types and ability to use techniques and skills needed for reducing stress and depression.   Initial Review & Psychosocial Screening: Initial Psych Review & Screening - 06/17/17 1517      Initial Review   Current issues with  Current Depression;History of Depression;Current Psychotropic Meds;Current Stress Concerns    Source of Stress Concerns  Chronic Illness;Unable to perform yard/household activities;Family    Comments  Her children do not know that she has emphysema.      Family Dynamics   Good Support System?  Yes    Comments  Her boyfriend of 15 years is a great support system for her.      Barriers   Psychosocial barriers to participate in program  Psychosocial barriers identified (see note);The patient should benefit from training in stress management and relaxation.      Screening Interventions   Interventions  Encouraged to exercise;Provide feedback about the scores to participant;Program counselor consult;To provide support and resources with identified  psychosocial needs    Expected Outcomes  Short Term goal: Utilizing psychosocial counselor, staff and physician to assist with identification of specific Stressors or current issues interfering with healing process. Setting desired goal for each stressor or current issue identified.;Long Term Goal: Stressors or current issues are controlled or eliminated.;Short Term goal: Identification and review with participant of any Quality of Life or Depression concerns found by scoring the questionnaire.;Long Term goal: The participant improves quality of Life and PHQ9 Scores as seen by post scores and/or verbalization of changes       Quality of Life Scores:  Scores of 19 and below usually indicate a poorer quality of life in these areas.  A difference of  2-3 points is a  clinically meaningful difference.  A difference of 2-3 points in the total score of the Quality of Life Index has been associated with significant improvement in overall quality of life, self-image, physical symptoms, and general health in studies assessing change in quality of life.  PHQ-9: Recent Review Flowsheet Data    Depression screen [facility] 2/9 06/17/2017   Decreased Interest 1   Down, Depressed, Hopeless 0   PHQ - 2 Score 1   Altered sleeping 0   Tired, decreased energy 2   Change in appetite 1   Feeling bad or failure about yourself  2   Trouble concentrating 0   Moving slowly or fidgety/restless 0   Suicidal thoughts 0   PHQ-9 Score 6   Difficult doing work/chores Somewhat difficult     Interpretation of Total Score  Total Score Depression Severity:  1-4 = Minimal depression, 5-9 = Mild depression, 10-14 = Moderate depression, 15-19 = Moderately severe depression, 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 Nyoka Cowden Shirlean Mylar) today for initial psychosocial evaluation.  She is a 57 year old who struggles with COPD.  Mckaylee has a strong support system with a significant other of 13 years; and (2) sisters and a brother who are local and involved.  Dalal reports sleeping "okay" other than being in pain with her arthritis.  Others report she is very "restless" at night and her arms are moving often.  She has had several sleep studies and it has been determined she does not have sleep apnea.  Senya reports a history of depression and anxiety and has been on medications for this for quite some time.  She reports these are helping and her mood is stable at this time; other than sadness over the recent loss of her 69  year old dog.  Seriyah has goals to breathe and feel better over all; to lose weight and to generally have a better quality of life.  Staff will follow with Warren throughout the course of this program.      Expected Outcomes  Collier will benefit from consistent exercise to achieve her stated goals.  She will meet with the dietician to address her weight loss goal.  Alijah will also benefit from the educational and psychoeducational components of this program to learn more about her condition and discover more positive ways of coping in general.      Continue Psychosocial Services   Follow up required by staff       Psychosocial Re-Evaluation: Psychosocial Re-Evaluation  [place] Name 07/14/17 1158 07/25/17 1154 09/10/17 1232         Psychosocial Re-Evaluation   Current issues with  Current Stress Concerns  Current Stress Concerns  Current Stress Concerns     Comments  Treniece is still stressed from losing her dog and other family issues - nephew and sister.  Shuree is starting to heal from losing her dog. She is enjoying coming to [facility] as a way to step away from work as well.   Helmi reports continual healing from the loss of her dog. She is staying busy with working at home, going out with her three sons and coming to [employer]. She reports work is getting better and is no longer a stressor     Expected Outcomes  Short - Athenia is looking for another dog Long - Mario will continue to exercise to get out of the house and deal with stress  Short: Skyeler will continue to come to [place]; Long: Danne will find other activities besides LW to get out of the house.   Short: Zafira will graduate from [place]: Shawnay will enroll in [employer] or another exercise program to stay active and get out of the house     Interventions  Encouraged to attend Pulmonary Rehabilitation for the exercise  Encouraged to attend Pulmonary Rehabilitation for the exercise  -     Continue Psychosocial Services   Follow up required by staff   -  -        Psychosocial Discharge (Final Psychosocial Re-Evaluation): Psychosocial Re-Evaluation - 09/10/17 1232      Psychosocial Re-Evaluation   Current issues with  Current Stress Concerns    Comments  Caley reports continual healing from the loss of her dog. She is staying busy with working at home, going out with her three sons and coming to [employer]. She reports work is getting better and is no longer a stressor    Expected Outcomes  Short: Azaria will graduate from [place]: Apolonia will enroll in [employer] or another exercise program to stay active and get out of the house       Education: Education Goals: Education classes will be provided on a weekly basis, covering required topics. Participant will state understanding/return demonstration of topics presented.  Learning Barriers/Preferences: Learning Barriers/Preferences - 06/17/17 1523      Learning Barriers/Preferences   Learning Barriers  None    Learning Preferences  None       Education Topics:  Initial Evaluation Education: - Verbal, written and demonstration of respiratory meds, oximetry and breathing techniques. Instruction on use of nebulizers and MDIs and importance of monitoring MDI activations.   Pulmonary Rehab from 09/10/2017 in [facility] Cardiac and Pulmonary Rehab  Date  06/17/17  Educator  [facility]  Instruction Review Code  1- Verbalizes Understanding      General Nutrition Guidelines/Fats and Fiber: -Group instruction provided by verbal, written material, models and posters to present the general guidelines for heart healthy nutrition. Gives an explanation and review of dietary fats and fiber.   Pulmonary Rehab from 09/10/2017 in [facility] Cardiac and Pulmonary Rehab  Date  08/04/17  Educator  CR  Instruction Review Code  1- Verbalizes Understanding      Controlling Sodium/Reading Food Labels: -Group verbal and written material supporting the discussion of sodium use in heart healthy nutrition. Review and  explanation with models, verbal and written materials for utilization of the food label.   Pulmonary Rehab from 09/10/2017 in [facility] Cardiac  and Pulmonary Rehab  Date  08/11/17  Educator  CR  Instruction Review Code  1- Verbalizes Understanding      Exercise Physiology & General Exercise Guidelines: - Group verbal and written instruction with models to review the exercise physiology of the cardiovascular system and associated critical values. Provides general exercise guidelines with specific guidelines to those with heart or lung disease.    Pulmonary Rehab from 09/10/2017 in [facility] Cardiac and Pulmonary Rehab  Date  08/20/17  Educator  [facility]  Instruction Review Code  1- Verbalizes Understanding      Aerobic Exercise & Resistance Training: - Gives group verbal and written instruction on the various components of exercise. Focuses on aerobic and resistive training programs and the benefits of this training and how to safely progress through these programs.   Flexibility, Balance, Mind/Body Relaxation: Provides group verbal/written instruction on the benefits of flexibility and balance training, including mind/body exercise modes such as yoga, pilates and tai chi.  Demonstration and skill practice provided.   Pulmonary Rehab from 09/10/2017 in [facility] Cardiac and Pulmonary Rehab  Date  06/25/17  Educator  AS  Instruction Review Code  1- Verbalizes Understanding      Stress and Anxiety: - Provides group verbal and written instruction about the health risks of elevated stress and causes of high stress.  Discuss the correlation between heart/lung disease and anxiety and treatment options. Review healthy ways to manage with stress and anxiety.   Depression: - Provides group verbal and written instruction on the correlation between heart/lung disease and depressed mood, treatment options, and the stigmas associated with seeking treatment.   Exercise & Equipment Safety: - Individual verbal  instruction and demonstration of equipment use and safety with use of the equipment.   Pulmonary Rehab from 09/10/2017 in [facility] Cardiac and Pulmonary Rehab  Date  06/17/17  Educator  [facility]  Instruction Review Code  1- Verbalizes Understanding      Infection Prevention: - Provides verbal and written material to individual with discussion of infection control including proper hand washing and proper equipment cleaning during exercise session.   Pulmonary Rehab from 09/10/2017 in [facility] Cardiac and Pulmonary Rehab  Date  06/17/17  Educator  [facility]  Instruction Review Code  1- Verbalizes Understanding      Falls Prevention: - Provides verbal and written material to individual with discussion of falls prevention and safety.   Pulmonary Rehab from 09/10/2017 in [facility] Cardiac and Pulmonary Rehab  Date  06/17/17  Educator  [facility]  Instruction Review Code  1- Verbalizes Understanding      Diabetes: - Individual verbal and written instruction to review signs/symptoms of diabetes, desired ranges of glucose level fasting, after meals and with exercise. Advice that pre and post exercise glucose checks will be done for 3 sessions at entry of program.   Chronic Lung Diseases: - Group verbal and written instruction to review updates, respiratory medications, advancements in procedures and treatments. Discuss use of supplemental oxygen including available portable oxygen systems, continuous and intermittent flow rates, concentrators, personal use and safety guidelines. Review proper use of inhaler and spacers. Provide informative websites for self-education.    Energy Conservation: - Provide group verbal and written instruction for methods to conserve energy, plan and organize activities. Instruct on pacing techniques, use of adaptive equipment and posture/positioning to relieve shortness of breath.   Triggers and Exacerbations: - Group verbal and written instruction to review types of environmental triggers and  ways to prevent exacerbations. Discuss weather changes, air  quality and the benefits of nasal washing. Review warning signs and symptoms to help prevent infections. Discuss techniques for effective airway clearance, coughing, and vibrations.   AED/CPR: - Group verbal and written instruction with the use of models to demonstrate the basic use of the AED with the basic ABC's of resuscitation.   Pulmonary Rehab from 09/10/2017 in [facility] Cardiac and Pulmonary Rehab  Date  07/11/17  Educator  [facility]  Instruction Review Code  1- Actuary and Physiology of the Lungs: - Group verbal and written instruction with the use of models to provide basic lung anatomy and physiology related to function, structure and complications of lung disease.   Pulmonary Rehab from 09/10/2017 in [facility] Cardiac and Pulmonary Rehab  Date  07/23/17  Educator  [facility]  Instruction Review Code  1- Verbalizes Understanding      Anatomy & Physiology of the Heart: - Group verbal and written instruction and models provide basic cardiac anatomy and physiology, with the coronary electrical and arterial systems. Review of Valvular disease and Heart Failure   Pulmonary Rehab from 09/10/2017 in [facility] Cardiac and Pulmonary Rehab  Date  09/10/17  Educator  [facility]  Instruction Review Code  1- Verbalizes Understanding      Cardiac Medications: - Group verbal and written instruction to review commonly prescribed medications for heart disease. Reviews the medication, class of the drug, and side effects.   Pulmonary Rehab from 09/10/2017 in [facility] Cardiac and Pulmonary Rehab  Date  06/27/17  Educator  [facility]  Instruction Review Code  1- Verbalizes Understanding      Know Your Numbers and Risk Factors: -Group verbal and written instruction about important numbers in your health.  Discussion of what are risk factors and how they play a role in the disease process.  Review of Cholesterol, Blood Pressure, Diabetes, and BMI and the  role they play in your overall health.   Pulmonary Rehab from 09/10/2017 in [facility] Cardiac and Pulmonary Rehab  Date  08/22/17  Educator  [facility]  Instruction Review Code  1- Verbalizes Understanding      Sleep Hygiene: -Provides group verbal and written instruction about how sleep can affect your health.  Define sleep hygiene, discuss sleep cycles and impact of sleep habits. Review good sleep hygiene tips.    Other: -Provides group and verbal instruction on various topics (see comments)    Knowledge Questionnaire Score: Knowledge Questionnaire Score - 06/17/17 1523      Knowledge Questionnaire Score   Pre Score  15/18 reviewed with patient        Core Components/Risk Factors/Patient Goals at Admission: Personal Goals and Risk Factors at Admission - 06/17/17 1526      Core Components/Risk Factors/Patient Goals on Admission    Weight Management  Yes;Obesity;Weight Loss    Intervention  Weight Management: Develop a combined nutrition and exercise program designed to reach desired caloric intake, while maintaining appropriate intake of nutrient and fiber, sodium and fats, and appropriate energy expenditure required for the weight goal.;Weight Management: Provide education and appropriate resources to help participant work on and attain dietary goals.;Weight Management/Obesity: Establish reasonable short term and long term weight goals.;Obesity: Provide education and appropriate resources to help participant work on and attain dietary goals.    Admit Weight  263 lb 3.2 oz (119.4 kg)    Goal Weight: Short Term  258 lb (117 kg)    Goal Weight: Long Term  160 lb (72.6 kg)    Expected  Outcomes  Short Term: Continue to assess and modify interventions until short term weight is achieved;Long Term: Adherence to nutrition and physical activity/exercise program aimed toward attainment of established weight goal;Weight Maintenance: Understanding of the daily nutrition guidelines, which includes 25-35%  calories from fat, 7% or less cal from saturated fats, less than '200mg'$  cholesterol, less than 1.5gm of sodium, & 5 or more servings of fruits and vegetables daily;Weight Loss: Understanding of general recommendations for a balanced deficit meal plan, which promotes 1-2 lb weight loss per week and includes a negative energy balance of [phone] kcal/d;Understanding recommendations for meals to include 15-35% energy as protein, 25-35% energy from fat, 35-60% energy from carbohydrates, less than '200mg'$  of dietary cholesterol, 20-35 gm of total fiber daily;Understanding of distribution of calorie intake throughout the day with the consumption of 4-5 meals/snacks    Tobacco Cessation  Yes    Number of packs per day  4 cigarettes a day    Intervention  Assist the participant in steps to quit. Provide individualized education and counseling about committing to Tobacco Cessation, relapse prevention, and pharmacological support that can be provided by physician.;Advice worker, assist with locating and accessing local/national Quit Smoking programs, and support quit date choice.    Expected Outcomes  Short Term: Will demonstrate readiness to quit, by selecting a quit date.;Long Term: Complete abstinence from all tobacco products for at least 12 months from quit date.;Short Term: Will quit all tobacco product use, adhering to prevention of relapse plan.    Improve shortness of breath with ADL's  Yes    Intervention  Provide education, individualized exercise plan and daily activity instruction to help decrease symptoms of SOB with activities of daily living.    Expected Outcomes  Short Term: Improve cardiorespiratory fitness to achieve a reduction of symptoms when performing ADLs;Long Term: Be able to perform more ADLs without symptoms or delay the onset of symptoms    Hypertension  Yes    Intervention  Provide education on lifestyle modifcations including regular physical activity/exercise, weight  management, moderate sodium restriction and increased consumption of fresh fruit, vegetables, and low fat dairy, alcohol moderation, and smoking cessation.;Monitor prescription use compliance.    Expected Outcomes  Short Term: Continued assessment and intervention until BP is < 140/26m HG in hypertensive participants. < 130/876mHG in hypertensive participants with diabetes, heart failure or chronic kidney disease.;Long Term: Maintenance of blood pressure at goal levels.       Core Components/Risk Factors/Patient Goals Review:  Goals and Risk Factor Review    Row Name 07/14/17 1152 07/14/17 1247 07/25/17 1149 09/10/17 1221       Core Components/Risk Factors/Patient Goals Review   Personal Goals Review  Tobacco Cessation;Improve shortness of breath with ADL's;Weight Management/Obesity  Tobacco Cessation  Weight Management/Obesity;Improve shortness of breath with ADL's;Tobacco Cessation  Tobacco Cessation;Weight Management/Obesity;Hypertension;Improve shortness of breath with ADL's    Review  RoJanellatates she is changing her diet to eat healthy - reducing portion sizes, especially sweets.  They are eating at home more often.  She is down to 3 cigarettes per day - one after each meal.  She state she has more energy since beginning to exercise.  She is checking her O2 and it was better today.  She is now taking wellbutrin.  Also gave her a fake cigarette to use to help stop smoking.  She also plans to use the patches if needed.   RoTimis down 2 lb with the motivation to keep losing weight.  Her shortness of breath has decreased since starting LW and she has noticed an improvement in her stamina. She is up to 5 cigs yesterday, but felt guilty about it and getting back to three a day and then quitting. She is very motivated to quit.   Kemonie is down to 162 (admit [phone]). She is really wanting to lose more, but has had some set backs recently with a growth having to be cut off of her leg and issues with the  stitches. She is wanting to work hard these next few weeks in the program to lose weight and work on her SOB. She can tell a huge difference in her breathing, especially after how it became worse with her being out of [facility]. She is looking forward to it improving again. With her extra time at home, she picked back up smoking 5-6 cigs a day, but is wanting to try the patch.     Expected Outcomes  Short - Kealy will schedule and meet with RD and continue to eat healthy  Long - Shandiin will reach first weight goal and continue to eat healthy  Short: Continue to work on quitting and use new fake cigarette.  Long: Continue to work towards cessation.   Short: continue on working on quitting smoking, come to [place] to work on SOB, stamina, and weight goals Long: continue to work on cessation and work on losing weight to get to her end goal of 160 lb.   Short: Kendall will continue to lose weight and improve SOB by attending [facility] and decrease smoking in a timely manner. Long: Ica will graduate [facility] and quit smoking.        Core Components/Risk Factors/Patient Goals at Discharge (Final Review):  Goals and Risk Factor Review - 09/10/17 1221      Core Components/Risk Factors/Patient Goals Review   Personal Goals Review  Tobacco Cessation;Weight Management/Obesity;Hypertension;Improve shortness of breath with ADL's    Review  Jeniece is down to 162 (admit [phone]). She is really wanting to lose more, but has had some set backs recently with a growth having to be cut off of her leg and issues with the stitches. She is wanting to work hard these next few weeks in the program to lose weight and work on her SOB. She can tell a huge difference in her breathing, especially after how it became worse with her being out of [facility]. She is looking forward to it improving again. With her extra time at home, she picked back up smoking 5-6 cigs a day, but is wanting to try the patch.     Expected Outcomes  Short: Jacqui  will continue to lose weight and improve SOB by attending [facility] and decrease smoking in a timely manner. Long: Kennidi will graduate [facility] and quit smoking.        ITP Comments: ITP Comments    Row Name 06/17/17 1428 06/23/17 0935 07/21/17 0837 08/18/17 0900 09/15/17 0848   ITP Comments  Medical Evaluation completed. Chart sent for review and changes to [name] Director of [facility]. Diagnosis can be found in [facility] encounter 05/30/17  30 day review completed. ITP sent to [name] Director of [place]. Continue with ITP unless changes are made by physician.  30 day review completed. ITP sent to [name] Director of [place]. Continue with ITP unless changes are made by physician.   30 day review completed. ITP sent to [name] Director of [place]. Continue with ITP unless  changes are made by physician   30 day review completed. ITP sent to [name] Director of [place]. Continue with ITP unless changes are made by physician      Comments: 30 day review

## 2017-09-30 ENCOUNTER — Telehealth: Payer: Self-pay | Admitting: *Deleted

## 2017-09-30 ENCOUNTER — Encounter: Payer: Self-pay | Admitting: *Deleted

## 2017-09-30 DIAGNOSIS — J439 Emphysema, unspecified: Secondary | ICD-10-CM

## 2017-09-30 NOTE — Telephone Encounter (Signed)
Called to check on status of return.  She had another place removed on her shin on 5/9. She had a wound check today and was cleared to return to rehab tomorrow.

## 2017-10-01 ENCOUNTER — Telehealth: Payer: Self-pay | Admitting: *Deleted

## 2017-10-01 NOTE — Telephone Encounter (Signed)
Zowie called to let us know that her wound started to bleed again today.  She was to go see the doctor today and will return next week.

## 2017-10-08 ENCOUNTER — Encounter: Payer: BLUE CROSS/BLUE SHIELD | Admitting: *Deleted

## 2017-10-08 DIAGNOSIS — J439 Emphysema, unspecified: Secondary | ICD-10-CM

## 2017-10-08 NOTE — Progress Notes (Signed)
Daily Session Note  [name] MRN: [id number] Date of Birth: 12-28-1960 Referring Provider:     Pulmonary Rehab from 06/17/2017 in [facility]  Referring Provider  Tereasa Coop MD      Encounter Date: 10/08/2017  Check In: Session Check In - 10/08/17 1134      Check-In   Location  [facility]    Staff Present  Alberteen Sam, MA, RCEP, CCRP, [name];Meredith Sherryll Burger, RN BSN;Joseph Flavia Shipper    Supervising physician immediately available to respond to emergencies  [facility] immediately available ER MD    Physician(s)  Dr. Quentin Cornwall and Joni Fears    Medication changes reported      No    Fall or balance concerns reported     No    Tobacco Cessation  No Change    Warm-up and Cool-down  Performed as group-led instruction    Resistance Training Performed  Yes    VAD Patient?  No      Pain Assessment   Currently in Pain?  No/denies          Social History   Tobacco Use  Smoking Status Current Every Day Smoker  . Packs/day: 0.25  . Years: 25.00  . Pack years: 6.25  . Types: Cigarettes  Smokeless Tobacco Never Used  Tobacco Comment   given fake cigarette today, she is on wellbutrin and has patches on standby.    Goals Met:  Proper associated with RPD/PD & O2 Sat Independence with exercise equipment Using PLB without cueing & demonstrates good technique Exercise tolerated well No report of cardiac concerns or symptoms Strength training completed today  Goals Unmet:  Not Applicable  Comments: Pt able to follow exercise prescription today without complaint.  Will continue to monitor for progression.    [name] is Medical Director for [place] [facility] and [facility].

## 2017-10-10 ENCOUNTER — Telehealth: Payer: Self-pay | Admitting: *Deleted

## 2017-10-10 ENCOUNTER — Encounter: Payer: Self-pay | Admitting: *Deleted

## 2017-10-10 DIAGNOSIS — J439 Emphysema, unspecified: Secondary | ICD-10-CM

## 2017-10-10 NOTE — Telephone Encounter (Signed)
Dana Hill called to let us know that she will be out until next Wednesday. She got a surprise beach trip.

## 2017-10-13 ENCOUNTER — Encounter: Payer: BLUE CROSS/BLUE SHIELD | Attending: Internal Medicine

## 2017-10-13 DIAGNOSIS — J439 Emphysema, unspecified: Secondary | ICD-10-CM | POA: Insufficient documentation

## 2017-10-13 NOTE — Progress Notes (Signed)
Pulmonary Individual Treatment Plan  Patient Details  Name: Tandi Hanko MRN: [id number] Date of Birth: 24-Sep-1960 Referring Provider:     Pulmonary Rehab from 06/17/2017 in [facility]  Referring Provider  Tereasa Coop MD      Initial Encounter Date:    Pulmonary Rehab from 06/17/2017 in [facility] Re[facility]  Date  06/17/17  Referring Provider  Tereasa Coop MD      Visit Diagnosis: Pulmonary emphysema, unspecified emphysema type ([place])  Patient's Home Medications on Admission:  Current Outpatient Medications:  .  albuterol (PROVENTIL HFA;VENTOLIN HFA) 108 (90 Base) MCG/ACT inhaler, Inhale 2 puffs into the lungs every 6 (six) hours as needed for wheezing or shortness of breath., Disp: , Rfl:  .  budesonide-formoterol (SYMBICORT) 160-4.5 MCG/ACT inhaler, Inhale 2 puffs into the lungs 2 (two) times daily., Disp: , Rfl:  .  buprenorphine-naloxone (SUBOXONE) 8-2 MG SUBL SL tablet, Place 1 tablet under the tongue 2 (two) times daily., Disp: , Rfl:  .  citalopram (CELEXA) 40 MG tablet, Take 40 mg by mouth daily., Disp: , Rfl:  .  estradiol (VIVELLE-DOT) 0.0375 MG/24HR, Place 1 patch onto the skin 2 (two) times a week., Disp: , Rfl:  .  fexofenadine (ALLEGRA) 180 MG tablet, Take 180 mg by mouth daily as needed for allergies or rhinitis., Disp: , Rfl:  .  hydrochlorothiazide (HYDRODIURIL) 25 MG tablet, Take 25 mg by mouth daily., Disp: , Rfl:  .  hydrOXYzine (ATARAX/VISTARIL) 25 MG tablet, Take 25 mg by mouth daily as needed for itching., Disp: , Rfl:  .  ibuprofen (ADVIL,MOTRIN) 800 MG tablet, Take 800 mg by mouth 2 (two) times daily as needed., Disp: , Rfl:  .  lamoTRIgine (LAMICTAL) 25 MG tablet, Take 25 mg by mouth 2 (two) times daily., Disp: , Rfl:  .  lisinopril (PRINIVIL,ZESTRIL) 40 MG tablet, Take 40 mg by mouth daily., Disp: , Rfl:  .  LORazepam (ATIVAN) 1 MG tablet, Take 1 mg by mouth 2 (two) times daily as needed for anxiety., Disp: , Rfl:  .   omeprazole (PRILOSEC) 20 MG capsule, Take 20 mg by mouth daily., Disp: , Rfl:  .  progesterone (PROMETRIUM) 100 MG capsule, Take 100 mg by mouth at bedtime., Disp: , Rfl:  .  Secukinumab (COSENTYX 300 DOSE) 150 MG/ML SOSY, Inject into the skin every 30 (thirty) days. Last dose 04/02/16, Disp: , Rfl:  .  spironolactone (ALDACTONE) 25 MG tablet, Take 25 mg by mouth daily., Disp: , Rfl:   Past Medical History: Past Medical History:  Diagnosis Date  . Asthma   . COPD (chronic obstructive pulmonary disease) ([place])   . Degenerative disc disease, cervical   . Depression   . GERD (gastroesophageal reflux disease)   . Hypertension   . Osteoarthritis   . Psoriatic arthritis ([place])   . Wears contact lenses    right eye    Tobacco Use: Social History   Tobacco Use  Smoking Status Current Every Day Smoker  . Packs/day: 0.25  . Years: 25.00  . Pack years: 6.25  . Types: Cigarettes  Smokeless Tobacco Never Used  Tobacco Comment   given fake cigarette today, she is on wellbutrin and has patches on standby.    Labs: Recent Review Flowsheet Data    There is no flowsheet data to display.       Pulmonary Assessment Scores: Pulmonary Assessment Scores    Row Name 06/17/17 [id number]  ADL UCSD   ADL Phase  Entry     SOB Score total  44     Rest  0     Walk  2     Stairs  4     Bath  2     Dress  1     Shop  3       CAT Score   CAT Score  21       mMRC Score   mMRC Score  3        Pulmonary Function Assessment: Pulmonary Function Assessment - 06/17/17 1522      Breath   Bilateral Breath Sounds  Wheezes    Shortness of Breath  Yes;Fear of Shortness of Breath;Limiting activity       Exercise Target Goals:    Exercise Program Goal: Individual exercise prescription set using results from initial 6 min walk test and THRR while considering  patient's activity barriers and safety.    Exercise Prescription Goal: Initial exercise prescription builds to 30-45 minutes a  day of aerobic activity, 2-3 days per week.  Home exercise guidelines will be given to patient during program as part of exercise prescription that the participant will acknowledge.  Activity Barriers & Risk Stratification: Activity Barriers & Cardiac Risk Stratification - 06/17/17 1623      Activity Barriers & Cardiac Risk Stratification   Activity Barriers  Arthritis;Joint Problems;History of Falls;Muscular Weakness;Deconditioning;Shortness of Breath soriatic arthritis, knee pain       6 Minute Walk: 6 Minute Walk    Row Name 06/17/17 1620         6 Minute Walk   Phase  Initial     Distance  894 feet     Walk Time  6 minutes     # of Rest Breaks  0     MPH  1.69     METS  2.17     RPE  13     Perceived Dyspnea   2     VO2 Peak  7.6     Symptoms  Yes (comment)     Comments  SOB, hip pain 5-6/10, back pain 3/10     Resting HR  69 bpm     Resting BP  136/74     Resting Oxygen Saturation   93 % 88% on Room Air     Exercise Oxygen Saturation  during 6 min walk  88 %     Max Ex. HR  127 bpm     Max Ex. BP  146/84     2 Minute Post BP  134/76       Interval HR   1 Minute HR  86     2 Minute HR  111     3 Minute HR  107     4 Minute HR  127     5 Minute HR  108     6 Minute HR  112     2 Minute Post HR  75     Interval Heart Rate?  Yes       Interval Oxygen   Interval Oxygen?  Yes     Baseline Oxygen Saturation %  93 %     1 Minute Oxygen Saturation %  89 %     1 Minute Liters of Oxygen  3 L     2 Minute Oxygen Saturation %  88 %     2 Minute Liters of Oxygen  3  L     3 Minute Oxygen Saturation %  89 %     3 Minute Liters of Oxygen  3 L     4 Minute Oxygen Saturation %  88 %     4 Minute Liters of Oxygen  3 L     5 Minute Oxygen Saturation %  89 %     5 Minute Liters of Oxygen  3 L     6 Minute Oxygen Saturation %  88 %     6 Minute Liters of Oxygen  3 L     2 Minute Post Oxygen Saturation %  96 %     2 Minute Post Liters of Oxygen  3 L       Oxygen Initial  Assessment: Oxygen Initial Assessment - 06/17/17 1523      Home Oxygen   Home Oxygen Device  E-Tanks she is just now getting home oxygen and needs to be evaluated    Sleep Oxygen Prescription  None    Home Exercise Oxygen Prescription  None    Home at Rest Exercise Oxygen Prescription  None      Initial 6 min Walk   Oxygen Used  Continuous;E-Tanks    Liters per minute  3      Program Oxygen Prescription   Program Oxygen Prescription  Continuous;E-Tanks    Liters per minute  3    Comments  patient is getting home oxygen and is suppose to use 3 liters.       Intervention   Short Term Goals  To learn and exhibit compliance with exercise, home and travel O2 prescription;To learn and understand importance of maintaining oxygen saturations>88%;To learn and demonstrate proper use of respiratory medications;To learn and demonstrate proper pursed lip breathing techniques or other breathing techniques.;To learn and understand importance of monitoring SPO2 with pulse oximeter and demonstrate accurate use of the pulse oximeter. inhaler at home. Albuterol prn    Long  Term Goals  Exhibits compliance with exercise, home and travel O2 prescription;Verbalizes importance of monitoring SPO2 with pulse oximeter and return demonstration;Maintenance of O2 saturations>88%;Exhibits proper breathing techniques, such as pursed lip breathing or other method taught during program session;Compliance with respiratory medication;Demonstrates proper use of MDI's       Oxygen Re-Evaluation: Oxygen Re-Evaluation    Row Name 06/25/17 1125 07/25/17 1158 09/10/17 1235         Program Oxygen Prescription   Program Oxygen Prescription  Continuous;E-Tanks  Continuous;E-Tanks  Continuous;E-Tanks     Liters per minute  '3  3  3     '$ Comments  patient is getting home oxygen and is suppose to use 3 liters.   -  patient is getting home oxygen and is suppose to use 3 liters.        Home Oxygen   Home Oxygen Device  E-Tanks   E-Tanks  E-Tanks     Sleep Oxygen Prescription  -  None  None     Home Exercise Oxygen Prescription  None  None  None     Home at Rest Exercise Oxygen Prescription  None  None  None     Compliance with Home Oxygen Use  Yes  Yes  Yes       Goals/Expected Outcomes   Short Term Goals  To learn and exhibit compliance with exercise, home and travel O2 prescription;To learn and understand importance of maintaining oxygen saturations>88%;To learn and demonstrate proper use of respiratory medications;To learn and demonstrate proper pursed  lip breathing techniques or other breathing techniques.;To learn and understand importance of monitoring SPO2 with pulse oximeter and demonstrate accurate use of the pulse oximeter.  To learn and exhibit compliance with exercise, home and travel O2 prescription;To learn and understand importance of maintaining oxygen saturations>88%;To learn and demonstrate proper use of respiratory medications;To learn and demonstrate proper pursed lip breathing techniques or other breathing techniques.;To learn and understand importance of monitoring SPO2 with pulse oximeter and demonstrate accurate use of the pulse oximeter.  To learn and exhibit compliance with exercise, home and travel O2 prescription;To learn and understand importance of maintaining oxygen saturations>88%;To learn and demonstrate proper use of respiratory medications;To learn and demonstrate proper pursed lip breathing techniques or other breathing techniques.;To learn and understand importance of monitoring SPO2 with pulse oximeter and demonstrate accurate use of the pulse oximeter.     Long  Term Goals  Exhibits compliance with exercise, home and travel O2 prescription;Verbalizes importance of monitoring SPO2 with pulse oximeter and return demonstration;Maintenance of O2 saturations>88%;Exhibits proper breathing techniques, such as pursed lip breathing or other method taught during program session;Compliance with  respiratory medication;Demonstrates proper use of MDI's  Exhibits compliance with exercise, home and travel O2 prescription;Verbalizes importance of monitoring SPO2 with pulse oximeter and return demonstration;Maintenance of O2 saturations>88%;Exhibits proper breathing techniques, such as pursed lip breathing or other method taught during program session;Compliance with respiratory medication;Demonstrates proper use of MDI's  Exhibits compliance with exercise, home and travel O2 prescription;Verbalizes importance of monitoring SPO2 with pulse oximeter and return demonstration;Maintenance of O2 saturations>88%;Exhibits proper breathing techniques, such as pursed lip breathing or other method taught during program session;Compliance with respiratory medication;Demonstrates proper use of MDI's     Comments  Reviewed PLB technique with pt.  Talked about how it work and it's important to maintaining his exercise saturations.    Paiten is using her oxygen correctly and managing it well. She is also continuing to use PLB here and outside of LW. Her medications are being used properly.   Jersie is continuing to use her oxygen appropriately. She feels more confident in managing it independently. She is practicing PLB when needed. She reports not having to use her rescue inhaler as much, and that her new daily inhaler is working well     Goals/Expected Outcomes  Short: Become more profiecient at using PLB.   Long: Become independent at using PLB.  Short: continue to work on PLB. Long: continue to manage oxygen and respiratory medication.   Short: continue to work on PLB and use oxygen appropirately. Long: become indpenedent with PLB         Oxygen Discharge (Final Oxygen Re-Evaluation): Oxygen Re-Evaluation - 09/10/17 1235      Program Oxygen Prescription   Program Oxygen Prescription  Continuous;E-Tanks    Liters per minute  3    Comments  patient is getting home oxygen and is suppose to use 3 liters.        Home Oxygen   Home Oxygen Device  E-Tanks    Sleep Oxygen Prescription  None    Home Exercise Oxygen Prescription  None    Home at Rest Exercise Oxygen Prescription  None    Compliance with Home Oxygen Use  Yes      Goals/Expected Outcomes   Short Term Goals  To learn and exhibit compliance with exercise, home and travel O2 prescription;To learn and understand importance of maintaining oxygen saturations>88%;To learn and demonstrate proper use of respiratory medications;To learn and demonstrate proper pursed lip  breathing techniques or other breathing techniques.;To learn and understand importance of monitoring SPO2 with pulse oximeter and demonstrate accurate use of the pulse oximeter.    Long  Term Goals  Exhibits compliance with exercise, home and travel O2 prescription;Verbalizes importance of monitoring SPO2 with pulse oximeter and return demonstration;Maintenance of O2 saturations>88%;Exhibits proper breathing techniques, such as pursed lip breathing or other method taught during program session;Compliance with respiratory medication;Demonstrates proper use of MDI's    Comments  Pearson is continuing to use her oxygen appropriately. She feels more confident in managing it independently. She is practicing PLB when needed. She reports not having to use her rescue inhaler as much, and that her new daily inhaler is working well    Goals/Expected Outcomes  Short: continue to work on PLB and use oxygen appropirately. Long: become indpenedent with PLB        Initial Exercise Prescription: Initial Exercise Prescription - 06/17/17 1600      Date of Initial Exercise RX and Referring Provider   Date  06/17/17    Referring Provider  Tereasa Coop MD      Oxygen   Oxygen  Continuous    Liters  3-4 4L on treadmill      Treadmill   MPH  1.5    Grade  0.5    Minutes  15    METs  2.25      Recumbant Elliptical   Level  1    RPM  50    Minutes  15    METs  2.1      T5 Nustep   Level  1     SPM  80    Minutes  15    METs  2.1      Prescription Details   Frequency (times per week)  3    Duration  Progress to 45 minutes of aerobic exercise without signs/symptoms of physical distress      Intensity   THRR 40-80% of Max Heartrate  106-143    Ratings of Perceived Exertion  11-13    Perceived Dyspnea  0-4      Progression   Progression  Continue to progress workloads to maintain intensity without signs/symptoms of physical distress.      Resistance Training   Training Prescription  Yes    Weight  3 lbs    Reps  10-15       Perform Capillary Blood Glucose checks as needed.  Exercise Prescription Changes: Exercise Prescription Changes    Row Name 06/17/17 1600 07/07/17 1600 07/18/17 1200 07/22/17 1400 08/05/17 1500     Response to Exercise   Blood Pressure (Admit)  136/74  130/70  -  124/76  124/80   Blood Pressure (Exercise)  146/84  -  -  -  -   Blood Pressure (Exit)  134/76  128/78  -  130/80  116/74   Heart Rate (Admit)  69 bpm  81 bpm  -  80 bpm  80 bpm   Heart Rate (Exercise)  127 bpm  96 bpm  -  89 bpm  85 bpm   Heart Rate (Exit)  75 bpm  73 bpm  -  72 bpm  71 bpm   Oxygen Saturation (Admit)  93 %  88 %  -  92 %  91 %   Oxygen Saturation (Exercise)  99 %  88 %  -  91 %  91 %   Oxygen Saturation (Exit)  96 %  95 %  -  96 %  94 %   Rating of Perceived Exertion (Exercise)  13  13  -  13  13   Perceived Dyspnea (Exercise)  2  1  -  0  0   Symptoms  hip pain 6/10, back pain 3/10, SOB  none  -  none  none   Comments  walk test results  -  -  -  -   Duration  -  Continue with 45 min of aerobic exercise without signs/symptoms of physical distress.  -  Continue with 45 min of aerobic exercise without signs/symptoms of physical distress.  Continue with 45 min of aerobic exercise without signs/symptoms of physical distress.   Intensity  -  THRR unchanged  -  THRR unchanged  THRR unchanged     Progression   Progression  -  Continue to progress workloads to maintain  intensity without signs/symptoms of physical distress.  -  Continue to progress workloads to maintain intensity without signs/symptoms of physical distress.  Continue to progress workloads to maintain intensity without signs/symptoms of physical distress.   Average METs  -  2.73  -  1.95  2.57     Resistance Training   Training Prescription  -  Yes  -  Yes  Yes   Weight  -  3 lbs  -  3 lbs  4 lbs   Reps  -  10-15  -  10-15  10-15     Interval Training   Interval Training  -  No  -  No  No     Oxygen   Oxygen  -  Continuous  -  Continuous  Continuous   Liters  -  2-3 3L on treadmill  -  3  2     Treadmill   MPH  -  1.5  -  1.5  2   Grade  -  0.5  -  0.5  1   Minutes  -  15  -  15  15   METs  -  2.25  -  2.25  2.81     Recumbant Elliptical   Level  -  1  -  2  2   RPM  -  39  -  33  50   Minutes  -  15  -  15  15   METs  -  1.3  -  1.6  2     T5 Nustep   Level  -  1  -  2  3   SPM  -  100  -  121  97   Minutes  -  15  -  15  15   METs  -  1.9  -  2  2.9     Home Exercise Plan   Plans to continue exercise at  -  -  Home (comment) gym in complex, treadmill, stairmill, walking  Home (comment) gym in complex, treadmill, stairmill, walking  Home (comment) gym in complex, treadmill, stairmill, walking   Frequency  -  -  Add 1 additional day to program exercise sessions.  Add 1 additional day to program exercise sessions.  Add 1 additional day to program exercise sessions.   Initial Home Exercises Provided  -  -  07/18/17  07/18/17  07/18/17   Row Name 08/18/17 1500 09/02/17 1400 09/16/17 1600         Response to Exercise   Blood Pressure (Admit)  130/74  132/82  132/72     Blood Pressure (Exit)  122/78  122/76  126/70     Heart Rate (Admit)  77 bpm  83 bpm  81 bpm     Heart Rate (Exercise)  89 bpm  97 bpm  97 bpm     Heart Rate (Exit)  93 bpm  73 bpm  86 bpm     Oxygen Saturation (Admit)  94 %  95 %  96 %     Oxygen Saturation (Exercise)  89 %  90 %  90 %     Oxygen  Saturation (Exit)  95 %  96 %  93 %     Rating of Perceived Exertion (Exercise)  '13  15  15     '$ Perceived Dyspnea (Exercise)  0  2  1     Symptoms  none  none  none     Duration  Continue with 45 min of aerobic exercise without signs/symptoms of physical distress.  Continue with 45 min of aerobic exercise without signs/symptoms of physical distress.  Continue with 45 min of aerobic exercise without signs/symptoms of physical distress.     Intensity  THRR unchanged  THRR unchanged  THRR unchanged       Progression   Progression  Continue to progress workloads to maintain intensity without signs/symptoms of physical distress.  Continue to progress workloads to maintain intensity without signs/symptoms of physical distress.  Continue to progress workloads to maintain intensity without signs/symptoms of physical distress.     Average METs  2.23  2.46  2.27       Resistance Training   Training Prescription  Yes  Yes  Yes     Weight  4 lbs  4 lbs  4 lbs     Reps  10-15  10-15  10-15       Interval Training   Interval Training  No  -  No       Oxygen   Oxygen  Continuous  Continuous  Continuous     Liters  '2  2  2       '$ Treadmill   MPH  1.8  2  1.8     Grade  0.5  0.5  0.5     Minutes  '15  15  15     '$ METs  2.5  2.67  2.5       Recumbant Elliptical   Level  '3  4  4     '$ RPM  44  46  41     Minutes  '15  15  15     '$ METs  2.1  2.6  2.2       T5 Nustep   Level  '3  4  4     '$ SPM  101  91  91     Minutes  '15  15  15     '$ METs  2.1  2.1  2.1       Home Exercise Plan   Plans to continue exercise at  Home (comment) gym in complex, treadmill, stairmill, walking  Home (comment) gym in complex, treadmill, stairmill, walking  Home (comment) gym in complex, treadmill, stairmill, walking     Frequency  Add 1 additional day to program exercise sessions.  Add 1 additional day to program exercise sessions.  Add 1 additional day to program exercise sessions.     Initial Home Exercises Provided   07/18/17  07/18/17  07/18/17  Exercise Comments: Exercise Comments    Row Name 06/25/17 1124           Exercise Comments  First full day of exercise!  Patient was oriented to gym and equipment including functions, settings, policies, and procedures.  Patient's individual exercise prescription and treatment plan were reviewed.  All starting workloads were established based on the results of the 6 minute walk test done at initial orientation visit.  The plan for exercise progression was also introduced and progression will be customized based on patient's performance and goals.          Exercise Goals and Review: Exercise Goals    Row Name 06/17/17 1628             Exercise Goals   Increase Physical Activity  Yes       Intervention  Provide advice, education, support and counseling about physical activity/exercise needs.;Develop an individualized exercise prescription for aerobic and resistive training based on initial evaluation findings, risk stratification, comorbidities and participant's personal goals.       Expected Outcomes  Long Term: Add in home exercise to make exercise part of routine and to increase amount of physical activity.;Short Term: Attend rehab on a regular basis to increase amount of physical activity.;Long Term: Exercising regularly at least 3-5 days a week.       Increase Strength and Stamina  Yes       Intervention  Provide advice, education, support and counseling about physical activity/exercise needs.;Develop an individualized exercise prescription for aerobic and resistive training based on initial evaluation findings, risk stratification, comorbidities and participant's personal goals.       Expected Outcomes  Short Term: Increase workloads from initial exercise prescription for resistance, speed, and METs.;Short Term: Perform resistance training exercises routinely during rehab and add in resistance training at home;Long Term: Improve cardiorespiratory  fitness, muscular endurance and strength as measured by increased METs and functional capacity (6MWT)       Able to understand and use rate of perceived exertion (RPE) scale  Yes       Intervention  Provide education and explanation on how to use RPE scale       Expected Outcomes  Short Term: Able to use RPE daily in rehab to express subjective intensity level;Long Term:  Able to use RPE to guide intensity level when exercising independently       Able to understand and use Dyspnea scale  Yes       Intervention  Provide education and explanation on how to use Dyspnea scale       Expected Outcomes  Short Term: Able to use Dyspnea scale daily in rehab to express subjective sense of shortness of breath during exertion;Long Term: Able to use Dyspnea scale to guide intensity level when exercising independently       Knowledge and understanding of Target Heart Rate Range (THRR)  Yes       Intervention  Provide education and explanation of THRR including how the numbers were predicted and where they are located for reference       Expected Outcomes  Short Term: Able to state/look up THRR;Short Term: Able to use daily as guideline for intensity in rehab;Long Term: Able to use THRR to govern intensity when exercising independently       Able to check pulse independently  Yes       Intervention  Provide education and demonstration on how to check pulse in carotid and radial arteries.;Review the importance of  being able to check your own pulse for safety during independent exercise       Expected Outcomes  Short Term: Able to explain why pulse checking is important during independent exercise;Long Term: Able to check pulse independently and accurately       Understanding of Exercise Prescription  Yes       Intervention  Provide education, explanation, and written materials on patient's individual exercise prescription       Expected Outcomes  Short Term: Able to explain program exercise prescription;Long Term:  Able to explain home exercise prescription to exercise independently          Exercise Goals Re-Evaluation : Exercise Goals Re-Evaluation    Row Name 06/25/17 1124 07/07/17 1621 07/18/17 1219 07/25/17 1156 08/05/17 1503     Exercise Goal Re-Evaluation   Exercise Goals Review  Understanding of Exercise Prescription;Able to understand and use Dyspnea scale;Knowledge and understanding of Target Heart Rate Range (THRR);Able to understand and use rate of perceived exertion (RPE) scale  Increase Physical Activity;Understanding of Exercise Prescription;Increase Strength and Stamina  Increase Physical Activity;Understanding of Exercise Prescription;Increase Strength and Stamina;Able to understand and use Dyspnea scale;Knowledge and understanding of Target Heart Rate Range (THRR);Able to understand and use rate of perceived exertion (RPE) scale;Able to check pulse independently  Increase Physical Activity;Understanding of Exercise Prescription;Increase Strength and Stamina;Able to understand and use Dyspnea scale;Knowledge and understanding of Target Heart Rate Range (THRR);Able to understand and use rate of perceived exertion (RPE) scale;Able to check pulse independently  Increase Physical Activity;Understanding of Exercise Prescription;Increase Strength and Stamina   Comments  Reviewed RPE scale, THR and program prescription with pt today.  Pt voiced understanding and was given a copy of goals to take home.   Korianna has been doing well in rehab.  She is now using 3L on the treadmill to help improve her saturations.  We will continue to monitor her progress.   Reviewed home exercise with pt today.  Pt plans to walk and go to gym in complex for exercise.  She will have access to a treadmill and stairmill. Reviewed THR, pulse, RPE, sign and symptoms, and when to call 911 or MD.  Also discussed weather considerations and indoor options.  Pt voiced understanding.  Foy reviewed what was said during home exercise last  week. She said she went to the gym at her complex and plans to go tomorrow to add another day of exercise  Frederick has been doing well in rehab.  She is now up to 2.0 mph on the treadmill and level 3 on the T5 NuStep.  We will continue to monitor her progression.     Expected Outcomes  Short: Use RPE daily to regulate intensity.  Long: Follow program prescription in THR.  Short: Continue to attend regularly.  Long: Continue to work on IT sales professional.   Short: Start going to gym 1 extra day a week.  Long: Exercise more on off days from rehab.   Short: continue to exercise one extra day outside of LW. Long: exercise independently.   Short: Continue to add in her home exercise at least one extra day a week.  Long: Continue to build strength and stamina.    [place] Name 08/18/17 1536 09/02/17 1423 09/16/17 1610 09/30/17 1456       Exercise Goal Re-Evaluation   Exercise Goals Review  Increase Physical Activity;Understanding of Exercise Prescription;Increase Strength and Stamina  Increase Physical Activity;Understanding of Exercise Prescription;Increase Strength and Stamina  Increase Physical Activity;Understanding  of Exercise Prescription;Increase Strength and Stamina  -    Comments  Tahlia continues to do well in rehab.  She had done level 4 on T5 NuStep but went back to level 3 and went back down to 1.58mh on the treadmill.  We will talk to her about these at next visit. We will continue to monitor her progression.  RAdryannacontinues to do well in rehab.  She had done level 4 on T5 NuStep but went back to level 3 and went back down to 1.8106m on the treadmill.  We will talk to her about these at next visit. We will continue to monitor her progression.  RoAmandyas been out for a week after dermatologic surgery.  She has only attended once since last reveiw.  We will continue to monitor her progression.   Out since last review.  Plans to return tomorrow 5/22.    Expected Outcomes   Short: Return workloads back to  where they should be.  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 Long: Continue to build strength and stamina.   Short: Return to rehab regularly.  Long: Continue to increase physical activity.   -       Discharge Exercise Prescription (Final Exercise Prescription Changes): Exercise Prescription Changes - 09/16/17 1600      Response to Exercise   Blood Pressure (Admit)  132/72    Blood Pressure (Exit)  126/70    Heart Rate (Admit)  81 bpm    Heart Rate (Exercise)  97 bpm    Heart Rate (Exit)  86 bpm    Oxygen Saturation (Admit)  96 %    Oxygen Saturation (Exercise)  90 %    Oxygen Saturation (Exit)  93 %    Rating of Perceived Exertion (Exercise)  15    Perceived Dyspnea (Exercise)  1    Symptoms  none    Duration  Continue with 45 min of aerobic exercise without signs/symptoms of physical distress.    Intensity  THRR unchanged      Progression   Progression  Continue to progress workloads to maintain intensity without signs/symptoms of physical distress.    Average METs  2.27      Resistance Training   Training Prescription  Yes    Weight  4 lbs    Reps  10-15      Interval Training   Interval Training  No      Oxygen   Oxygen  Continuous    Liters  2      Treadmill   MPH  1.8    Grade  0.5    Minutes  15    METs  2.5      Recumbant Elliptical   Level  4    RPM  41    Minutes  15    METs  2.2      T5 Nustep   Level  4    SPM  91    Minutes  15    METs  2.1      Home Exercise Plan   Plans to continue exercise at  Home (comment) gym in complex, treadmill, stairmill, walking    Frequency  Add 1 additional day to program exercise sessions.    Initial Home Exercises Provided  07/18/17       Nutrition:  Target Goals: Understanding of nutrition guidelines, daily intake of sodium '1500mg'$ , cholesterol '200mg'$ , calories 30% from fat  and 7% or less from saturated fats, daily to have 5 or more servings of fruits  and vegetables.  Biometrics: Pre Biometrics - 06/17/17 1629      Pre Biometrics   Height  5' 3.5" (1.613 m)    Weight  263 lb 3.2 oz (119.4 kg)    Waist Circumference  46 inches    Hip Circumference  59.25 inches    Waist to Hip Ratio  0.78 %    BMI (Calculated)  45.89        Nutrition Therapy Plan and Nutrition Goals: Nutrition Therapy & Goals - 06/17/17 1520      Personal Nutrition Goals   Comments  weight loss, eating healthier and learning about healthier options.      Intervention Plan   Intervention  Prescribe, educate and counsel regarding individualized specific dietary modifications aiming towards targeted core components such as weight, hypertension, lipid management, diabetes, heart failure and other comorbidities.;Nutrition handout(s) given to patient.    Expected Outcomes  Short Term Goal: Understand basic principles of dietary content, such as calories, fat, sodium, cholesterol and nutrients.;Short Term Goal: A plan has been developed with personal nutrition goals set during dietitian appointment.;Long Term Goal: Adherence to prescribed nutrition plan.       Nutrition Assessments: Nutrition Assessments - 06/17/17 1520      MEDFICTS Scores   Pre Score  62       Nutrition Goals Re-Evaluation: Nutrition Goals Re-Evaluation    [place] Name 07/14/17 1157 07/25/17 1153 09/10/17 1228         Goals   Current Weight  264 lb (119.7 kg)  261 lb (118.4 kg)  262 lb (118.8 kg)     Comment  Saadia will schedule with RD and folow her recommendations  Kaliopi had to miss her initial appointment with the RD due to home issues, but has a second appointment the end of this month. She reported not eating out as much and watching her portion sizes.   Lolah did not want to reschedule her nutrition appointment, but knows she can change her mind at any point. She reports that she is being more mindful of food labels and cooking healthier options at home     Expected Outcome  Short - Adrianah  will meet with RD  Long - Addylin will continue to lose weight  Short: Jyssica will meet with the dietician. Long: Anzal will work on eating healthier and get to her goal weight.   Short: Gresia will attend the General Nutrition class in [facility]. Long: Emilyn will become independent in eaiting a healthy diet and reach her goal weight.         Nutrition Goals Discharge (Final Nutrition Goals Re-Evaluation): Nutrition Goals Re-Evaluation - 09/10/17 1228      Goals   Current Weight  262 lb (118.8 kg)    Comment  Nylee did not want to reschedule her nutrition appointment, but knows she can change her mind at any point. She reports that she is being more mindful of food labels and cooking healthier options at home    Expected Outcome  Short: Lakyn will attend the General Nutrition class in [place]. Long: Linetta will become independent in eaiting a healthy diet and reach her goal weight.        Psychosocial: Target Goals: Acknowledge presence or absence of significant depression and/or stress, maximize coping skills, provide positive support system. Participant is able to verbalize types and ability to use techniques and skills needed for  reducing stress and depression.   Initial Review & Psychosocial Screening: Initial Psych Review & Screening - 06/17/17 1517      Initial Review   Current issues with  Current Depression;History of Depression;Current Psychotropic Meds;Current Stress Concerns    Source of Stress Concerns  Chronic Illness;Unable to perform yard/household activities;Family    Comments  Her children do not know that she has emphysema.      Family Dynamics   Good Support System?  Yes    Comments  Her boyfriend of 15 years is a great support system for her.      Barriers   Psychosocial barriers to participate in program  Psychosocial barriers identified (see note);The patient should benefit from training in stress management and relaxation.      Screening Interventions    Interventions  Encouraged to exercise;Provide feedback about the scores to participant;Program counselor consult;To provide support and resources with identified psychosocial needs    Expected Outcomes  Short Term goal: Utilizing psychosocial counselor, staff and physician to assist with identification of specific Stressors or current issues interfering with healing process. Setting desired goal for each stressor or current issue identified.;Long Term Goal: Stressors or current issues are controlled or eliminated.;Short Term goal: Identification and review with participant of any Quality of Life or Depression concerns found by scoring the questionnaire.;Long Term goal: The participant improves quality of Life and PHQ9 Scores as seen by post scores and/or verbalization of changes       Quality of Life Scores:  Scores of 19 and below usually indicate a poorer quality of life in these areas.  A difference of  2-3 points is a clinically meaningful difference.  A difference of 2-3 points in the total score of the Quality of Life Index has been associated with significant improvement in overall quality of life, self-image, physical symptoms, and general health in studies assessing change in quality of life.  PHQ-9: Recent Review Flowsheet Data    Depression screen [facility] 2/9 06/17/2017   Decreased Interest 1   Down, Depressed, Hopeless 0   PHQ - 2 Score 1   Altered sleeping 0   Tired, decreased energy 2   Change in appetite 1   Feeling bad or failure about yourself  2   Trouble concentrating 0   Moving slowly or fidgety/restless 0   Suicidal thoughts 0   PHQ-9 Score 6   Difficult doing work/chores Somewhat difficult     Interpretation of Total Score  Total Score Depression Severity:  1-4 = Minimal depression, 5-9 = Mild depression, 10-14 = Moderate depression, 15-19 = Moderately severe depression, 20-27 = Severe depression   Psychosocial Evaluation and Intervention: Psychosocial Evaluation -  07/07/17 1242      Psychosocial Evaluation & Interventions   Interventions  Encouraged to exercise with the program and follow exercise prescription;Relaxation education;Stress management education    Comments  Counselor met with Ms. Nyoka Cowden Shirlean Mylar) today for initial psychosocial evaluation.  She is a 57 year old who struggles with COPD.  Marshay has a strong support system with a significant other of 13 years; and (2) sisters and a brother who are local and involved.  Melina reports sleeping "okay" other than being in pain with her arthritis.  Others report she is very "restless" at night and her arms are moving often.  She has had several sleep studies and it has been determined she does not have sleep apnea.  Jessic reports a history of depression and anxiety and has been on  medications for this for quite some time.  She reports these are helping and her mood is stable at this time; other than sadness over the recent loss of her 56 year old dog.  Zanayah has goals to breathe and feel better over all; to lose weight and to generally have a better quality of life.  Staff will follow with Eleora throughout the course of this program.      Expected Outcomes  Shalena will benefit from consistent exercise to achieve her stated goals.  She will meet with the dietician to address her weight loss goal.  Blannie will also benefit from the educational and psychoeducational components of this program to learn more about her condition and discover more positive ways of coping in general.      Continue Psychosocial Services   Follow up required by staff       Psychosocial Re-Evaluation: Psychosocial Re-Evaluation    [place] Name 07/14/17 1158 07/25/17 1154 09/10/17 1232         Psychosocial Re-Evaluation   Current issues with  Current Stress Concerns  Current Stress Concerns  Current Stress Concerns     Comments  Jamelia is still stressed from losing her dog and other family issues - nephew and sister.  Davan is starting to  heal from losing her dog. She is enjoying coming to [facility] as a way to step away from work as well.   Adalai reports continual healing from the loss of her dog. She is staying busy with working at home, going out with her three sons and coming to [employer]. She reports work is getting better and is no longer a stressor     Expected Outcomes  Short - Amarii is looking for another dog Long - Justise will continue to exercise to get out of the house and deal with stress  Short: Zyanya will continue to come to [place]; Long: Hartlee will find other activities besides LW to get out of the house.   Short: Makisha will graduate from [employer]: Timi will enroll in [employer] or another exercise program to stay active and get out of the house     Interventions  Encouraged to attend Pulmonary Rehabilitation for the exercise  Encouraged to attend Pulmonary Rehabilitation for the exercise  -     Continue Psychosocial Services   Follow up required by staff  -  -        Psychosocial Discharge (Final Psychosocial Re-Evaluation): Psychosocial Re-Evaluation - 09/10/17 1232      Psychosocial Re-Evaluation   Current issues with  Current Stress Concerns    Comments  Reizy reports continual healing from the loss of her dog. She is staying busy with working at home, going out with her three sons and coming to [employer]. She reports work is getting better and is no longer a stressor    Expected Outcomes  Short: Demonica will graduate from [place]: Dannia will enroll in [employer] or another exercise program to stay active and get out of the house       Education: Education Goals: Education classes will be provided on a weekly basis, covering required topics. Participant will state understanding/return demonstration of topics presented.  Learning Barriers/Preferences: Learning Barriers/Preferences - 06/17/17 1523      Learning Barriers/Preferences   Learning Barriers  None    Learning Preferences  None       Education  Topics:  Initial Evaluation Education: - Verbal, written and demonstration of respiratory meds, oximetry and  breathing techniques. Instruction on use of nebulizers and MDIs and importance of monitoring MDI activations.   Pulmonary Rehab from 10/08/2017 in [facility] Cardiac and Pulmonary Rehab  Date  06/17/17  Educator  [facility]  Instruction Review Code  1- Verbalizes Understanding      General Nutrition Guidelines/Fats and Fiber: -Group instruction provided by verbal, written material, models and posters to present the general guidelines for heart healthy nutrition. Gives an explanation and review of dietary fats and fiber.   Pulmonary Rehab from 10/08/2017 in [facility] Cardiac and Pulmonary Rehab  Date  08/04/17  Educator  CR  Instruction Review Code  1- Verbalizes Understanding      Controlling Sodium/Reading Food Labels: -Group verbal and written material supporting the discussion of sodium use in heart healthy nutrition. Review and explanation with models, verbal and written materials for utilization of the food label.   Pulmonary Rehab from 10/08/2017 in [facility] Cardiac and Pulmonary Rehab  Date  08/11/17  Educator  CR  Instruction Review Code  1- Verbalizes Understanding      Exercise Physiology & General Exercise Guidelines: - Group verbal and written instruction with models to review the exercise physiology of the cardiovascular system and associated critical values. Provides general exercise guidelines with specific guidelines to those with heart or lung disease.    Pulmonary Rehab from 10/08/2017 in [facility] Cardiac and Pulmonary Rehab  Date  08/20/17  Educator  [facility]  Instruction Review Code  1- Verbalizes Understanding      Aerobic Exercise & Resistance Training: - Gives group verbal and written instruction on the various components of exercise. Focuses on aerobic and resistive training programs and the benefits of this training and how to safely progress through these  programs.   Flexibility, Balance, Mind/Body Relaxation: Provides group verbal/written instruction on the benefits of flexibility and balance training, including mind/body exercise modes such as yoga, pilates and tai chi.  Demonstration and skill practice provided.   Pulmonary Rehab from 10/08/2017 in [facility] Cardiac and Pulmonary Rehab  Date  06/25/17  Educator  AS  Instruction Review Code  1- Verbalizes Understanding      Stress and Anxiety: - Provides group verbal and written instruction about the health risks of elevated stress and causes of high stress.  Discuss the correlation between heart/lung disease and anxiety and treatment options. Review healthy ways to manage with stress and anxiety.   Pulmonary Rehab from 10/08/2017 in [facility] Cardiac and Pulmonary Rehab  Date  10/08/17  Educator  [facility]  Instruction Review Code  1- Verbalizes Understanding      Depression: - Provides group verbal and written instruction on the correlation between heart/lung disease and depressed mood, treatment options, and the stigmas associated with seeking treatment.   Exercise & Equipment Safety: - Individual verbal instruction and demonstration of equipment use and safety with use of the equipment.   Pulmonary Rehab from 10/08/2017 in [facility] Cardiac and Pulmonary Rehab  Date  06/17/17  Educator  [facility]  Instruction Review Code  1- Verbalizes Understanding      Infection Prevention: - Provides verbal and written material to individual with discussion of infection control including proper hand washing and proper equipment cleaning during exercise session.   Pulmonary Rehab from 10/08/2017 in [facility] Cardiac and Pulmonary Rehab  Date  06/17/17  Educator  [facility]  Instruction Review Code  1- Verbalizes Understanding      Falls Prevention: - Provides verbal and written material to individual with discussion of falls prevention and safety.  Pulmonary Rehab from 10/08/2017 in [facility] Cardiac and Pulmonary Rehab  Date   06/17/17  Educator  [facility]  Instruction Review Code  1- Verbalizes Understanding      Diabetes: - Individual verbal and written instruction to review signs/symptoms of diabetes, desired ranges of glucose level fasting, after meals and with exercise. Advice that pre and post exercise glucose checks will be done for 3 sessions at entry of program.   Chronic Lung Diseases: - Group verbal and written instruction to review updates, respiratory medications, advancements in procedures and treatments. Discuss use of supplemental oxygen including available portable oxygen systems, continuous and intermittent flow rates, concentrators, personal use and safety guidelines. Review proper use of inhaler and spacers. Provide informative websites for self-education.    Energy Conservation: - Provide group verbal and written instruction for methods to conserve energy, plan and organize activities. Instruct on pacing techniques, use of adaptive equipment and posture/positioning to relieve shortness of breath.   Triggers and Exacerbations: - Group verbal and written instruction to review types of environmental triggers and ways to prevent exacerbations. Discuss weather changes, air quality and the benefits of nasal washing. Review warning signs and symptoms to help prevent infections. Discuss techniques for effective airway clearance, coughing, and vibrations.   AED/CPR: - Group verbal and written instruction with the use of models to demonstrate the basic use of the AED with the basic ABC's of resuscitation.   Pulmonary Rehab from 10/08/2017 in [facility] Cardiac and Pulmonary Rehab  Date  07/11/17  Educator  [facility]  Instruction Review Code  1- Actuary and Physiology of the Lungs: - Group verbal and written instruction with the use of models to provide basic lung anatomy and physiology related to function, structure and complications of lung disease.   Pulmonary Rehab from 10/08/2017 in  [facility] Cardiac and Pulmonary Rehab  Date  07/23/17  Educator  [facility]  Instruction Review Code  1- Verbalizes Understanding      Anatomy & Physiology of the Heart: - Group verbal and written instruction and models provide basic cardiac anatomy and physiology, with the coronary electrical and arterial systems. Review of Valvular disease and Heart Failure   Pulmonary Rehab from 10/08/2017 in [facility] Cardiac and Pulmonary Rehab  Date  09/10/17  Educator  [facility]  Instruction Review Code  1- Verbalizes Understanding      Cardiac Medications: - Group verbal and written instruction to review commonly prescribed medications for heart disease. Reviews the medication, class of the drug, and side effects.   Pulmonary Rehab from 10/08/2017 in [facility] Cardiac and Pulmonary Rehab  Date  06/27/17  Educator  [facility]  Instruction Review Code  1- Verbalizes Understanding      Know Your Numbers and Risk Factors: -Group verbal and written instruction about important numbers in your health.  Discussion of what are risk factors and how they play a role in the disease process.  Review of Cholesterol, Blood Pressure, Diabetes, and BMI and the role they play in your overall health.   Pulmonary Rehab from 10/08/2017 in [facility] Cardiac and Pulmonary Rehab  Date  08/22/17  Educator  [facility]  Instruction Review Code  1- Verbalizes Understanding      Sleep Hygiene: -Provides group verbal and written instruction about how sleep can affect your health.  Define sleep hygiene, discuss sleep cycles and impact of sleep habits. Review good sleep hygiene tips.    Other: -Provides group and verbal instruction on various topics (see comments)  Knowledge Questionnaire Score: Knowledge Questionnaire Score - 06/17/17 1523      Knowledge Questionnaire Score   Pre Score  15/18 reviewed with patient        Core Components/Risk Factors/Patient Goals at Admission: Personal Goals and Risk Factors at Admission - 06/17/17 1526      Core  Components/Risk Factors/Patient Goals on Admission    Weight Management  Yes;Obesity;Weight Loss    Intervention  Weight Management: Develop a combined nutrition and exercise program designed to reach desired caloric intake, while maintaining appropriate intake of nutrient and fiber, sodium and fats, and appropriate energy expenditure required for the weight goal.;Weight Management: Provide education and appropriate resources to help participant work on and attain dietary goals.;Weight Management/Obesity: Establish reasonable short term and long term weight goals.;Obesity: Provide education and appropriate resources to help participant work on and attain dietary goals.    Admit Weight  263 lb 3.2 oz (119.4 kg)    Goal Weight: Short Term  258 lb (117 kg)    Goal Weight: Long Term  160 lb (72.6 kg)    Expected Outcomes  Short Term: Continue to assess and modify interventions until short term weight is achieved;Long Term: Adherence to nutrition and physical activity/exercise program aimed toward attainment of established weight goal;Weight Maintenance: Understanding of the daily nutrition guidelines, which includes 25-35% calories from fat, 7% or less cal from saturated fats, less than '200mg'$  cholesterol, less than 1.5gm of sodium, & 5 or more servings of fruits and vegetables daily;Weight Loss: Understanding of general recommendations for a balanced deficit meal plan, which promotes 1-2 lb weight loss per week and includes a negative energy balance of [phone] kcal/d;Understanding recommendations for meals to include 15-35% energy as protein, 25-35% energy from fat, 35-60% energy from carbohydrates, less than '200mg'$  of dietary cholesterol, 20-35 gm of total fiber daily;Understanding of distribution of calorie intake throughout the day with the consumption of 4-5 meals/snacks    Tobacco Cessation  Yes    Number of packs per day  4 cigarettes a day    Intervention  Assist the participant in steps to quit. Provide  individualized education and counseling about committing to Tobacco Cessation, relapse prevention, and pharmacological support that can be provided by physician.;Advice worker, assist with locating and accessing local/national Quit Smoking programs, and support quit date choice.    Expected Outcomes  Short Term: Will demonstrate readiness to quit, by selecting a quit date.;Long Term: Complete abstinence from all tobacco products for at least 12 months from quit date.;Short Term: Will quit all tobacco product use, adhering to prevention of relapse plan.    Improve shortness of breath with ADL's  Yes    Intervention  Provide education, individualized exercise plan and daily activity instruction to help decrease symptoms of SOB with activities of daily living.    Expected Outcomes  Short Term: Improve cardiorespiratory fitness to achieve a reduction of symptoms when performing ADLs;Long Term: Be able to perform more ADLs without symptoms or delay the onset of symptoms    Hypertension  Yes    Intervention  Provide education on lifestyle modifcations including regular physical activity/exercise, weight management, moderate sodium restriction and increased consumption of fresh fruit, vegetables, and low fat dairy, alcohol moderation, and smoking cessation.;Monitor prescription use compliance.    Expected Outcomes  Short Term: Continued assessment and intervention until BP is < 140/23m HG in hypertensive participants. < 130/835mHG in hypertensive participants with diabetes, heart failure or chronic kidney disease.;Long Term: Maintenance of blood pressure  at goal levels.       Core Components/Risk Factors/Patient Goals Review:  Goals and Risk Factor Review    Row Name 07/14/17 1152 07/14/17 1247 07/25/17 1149 09/10/17 1221 10/08/17 1140     Core Components/Risk Factors/Patient Goals Review   Personal Goals Review  Tobacco Cessation;Improve shortness of breath with ADL's;Weight  Management/Obesity  Tobacco Cessation  Weight Management/Obesity;Improve shortness of breath with ADL's;Tobacco Cessation  Tobacco Cessation;Weight Management/Obesity;Hypertension;Improve shortness of breath with ADL's  -   Review  Noya states she is changing her diet to eat healthy - reducing portion sizes, especially sweets.  They are eating at home more often.  She is down to 3 cigarettes per day - one after each meal.  She state she has more energy since beginning to exercise.  She is checking her O2 and it was better today.  She is now taking wellbutrin.  Also gave her a fake cigarette to use to help stop smoking.  She also plans to use the patches if needed.   Jazlynn is down 2 lb with the motivation to keep losing weight. Her shortness of breath has decreased since starting LW and she has noticed an improvement in her stamina. She is up to 5 cigs yesterday, but felt guilty about it and getting back to three a day and then quitting. She is very motivated to quit.   Shakeisha is down to 162 (admit [phone]). She is really wanting to lose more, but has had some set backs recently with a growth having to be cut off of her leg and issues with the stitches. She is wanting to work hard these next few weeks in the program to lose weight and work on her SOB. She can tell a huge difference in her breathing, especially after how it became worse with her being out of [facility]. She is looking forward to it improving again. With her extra time at home, she picked back up smoking 5-6 cigs a day, but is wanting to try the patch.   Cayleigh returned after a month long absence due to her leg procedure. Hopefully plans to attend regularly.    Expected Outcomes  Short - Janny will schedule and meet with RD and continue to eat healthy  Long - Reita will reach first weight goal and continue to eat healthy  Short: Continue to work on quitting and use new fake cigarette.  Long: Continue to work towards cessation.   Short: continue on  working on quitting smoking, come to [place] to work on SOB, stamina, and weight goals Long: continue to work on cessation and work on losing weight to get to her end goal of 160 lb.   Short: Negar will continue to lose weight and improve SOB by attending [facility] and decrease smoking in a timely manner. Long: Tyshawn will graduate [facility] and quit smoking.   -      Core Components/Risk Factors/Patient Goals at Discharge (Final Review):  Goals and Risk Factor Review - 10/08/17 1140      Core Components/Risk Factors/Patient Goals Review   Review  Kemiya returned after a month long absence due to her leg procedure. Hopefully plans to attend regularly.        ITP Comments: ITP Comments    Row Name 06/17/17 1428 06/23/17 0935 07/21/17 0837 08/18/17 0900 09/15/17 0848   ITP Comments  Medical Evaluation completed. Chart sent for review and changes to [name] Director of [place]. Diagnosis can be found in [facility] encounter 05/30/17  30 day review completed. ITP sent to [name] Director of [place]. Continue with ITP unless changes are made by physician.  30 day review completed. ITP sent to [name] Director of [place]. Continue with ITP unless changes are made by physician.   30 day review completed. ITP sent to [name] Director of [place]. Continue with ITP unless changes are made by physician   30 day review completed. ITP sent to [name] Director of [place]. Continue with ITP unless changes are made by physician   Row Name 09/30/17 1456 10/01/17 1439 10/08/17 1138 10/10/17 1114 10/13/17 0837   ITP Comments  Called to check on status of return.  She had another place removed on her shin on 5/9. She had a wound check today and was cleared to return to rehab tomorrow.    Zissy called to let us know that her wound started to bleed again today.  She was to go see the doctor today and will return next week.   Sharena returned after a month long absence due to her leg  procedure. Hopefully plans to attend regularly.   Avyonna called to let us know that she will be out until next Wednesday. She got a surprise beach trip.    30 day review completed. ITP sent to [name] Director of [place]. Continue with ITP unless changes are made by physician      Comments: 30 day review

## 2017-10-15 ENCOUNTER — Encounter: Payer: BLUE CROSS/BLUE SHIELD | Admitting: *Deleted

## 2017-10-15 DIAGNOSIS — J439 Emphysema, unspecified: Secondary | ICD-10-CM | POA: Diagnosis not present

## 2017-10-15 NOTE — Progress Notes (Signed)
Daily Session Note  [name] MRN: [id number] Date of Birth: 1960/06/04 Referring Provider:     Pulmonary Rehab from 06/17/2017 in [facility] Cardiac and Pulmonary Rehab  Referring Provider  Tereasa Coop MD      Encounter Date: 10/15/2017  Check In: Session Check In - 10/15/17 1124      Check-In   Location  [facility]-Cardiac & Pulmonary Rehab    Staff Present  Justin Mend Naida Sleight, RN BSN    Supervising physician immediately available to respond to emergencies  [facility] immediately available ER MD    Physician(s)  Drs. Quentin Cornwall and [place]    Medication changes reported      No    Fall or balance concerns reported     No    Tobacco Cessation  No Change    Warm-up and Cool-down  Performed as group-led Higher education careers adviser Performed  Yes    VAD Patient?  No      Pain Assessment   Currently in Pain?  No/denies          Social History   Tobacco Use  Smoking Status Current Every Day Smoker  . Packs/day: 0.25  . Years: 25.00  . Pack years: 6.25  . Types: Cigarettes  Smokeless Tobacco Never Used  Tobacco Comment   given fake cigarette today, she is on wellbutrin and has patches on standby.    Goals Met:  Proper associated with RPD/PD & O2 Sat Independence with exercise equipment Using PLB without cueing & demonstrates good technique Exercise tolerated well Strength training completed today  Goals Unmet:  Not Applicable  Comments: Pt able to follow exercise prescription today without complaint.  Will continue to monitor for progression.    [name] is Medical Director for [place] and [facility].

## 2017-10-20 DIAGNOSIS — J439 Emphysema, unspecified: Secondary | ICD-10-CM | POA: Diagnosis not present

## 2017-10-20 NOTE — Progress Notes (Signed)
Daily Session Note  [name] MRN: [id number] Date of Birth: 06-01-1960 Referring Provider:     Pulmonary Rehab from 06/17/2017 in [facility] Cardiac and Pulmonary Rehab  Referring Provider  Tereasa Coop MD      Encounter Date: 10/20/2017  Check In: Session Check In - 10/20/17 1118      Check-In   Location  [facility]-Cardiac & Pulmonary Rehab    Staff Present  [name],[name],[name];[name], BS, [name], BS, [name], [name]    Supervising physician immediately available to respond to emergencies  [facility] immediately available ER MD    Physician(s)  [name] and [name]    Medication changes reported      No    Fall or balance concerns reported     No    Tobacco Cessation  No Change    Warm-up and Cool-down  Performed as group-led instruction    Resistance Training Performed  Yes    VAD Patient?  No      Pain Assessment   Currently in Pain?  No/denies          Social History   Tobacco Use  Smoking Status Current Every Day Smoker  . Packs/day: 0.25  . Years: 25.00  . Pack years: 6.25  . Types: Cigarettes  Smokeless Tobacco Never Used  Tobacco Comment   given fake cigarette today, she is on wellbutrin and has patches on standby.    Goals Met:  Independence with exercise equipment Exercise tolerated well No report of cardiac concerns or symptoms Strength training completed today  Goals Unmet:  Not Applicable  Comments: Pt able to follow exercise prescription today without complaint.  Will continue to monitor for progression.   [name] is Medical Director for [place] and [facility].

## 2017-10-29 ENCOUNTER — Encounter: Payer: Self-pay | Admitting: *Deleted

## 2017-10-29 DIAGNOSIS — J439 Emphysema, unspecified: Secondary | ICD-10-CM

## 2017-10-31 DIAGNOSIS — J439 Emphysema, unspecified: Secondary | ICD-10-CM | POA: Diagnosis not present

## 2017-10-31 NOTE — Progress Notes (Signed)
Daily Session Note  [name] MRN: [id number] Date of Birth: 03-24-1961 Referring Provider:     Pulmonary Rehab from 06/17/2017 in [facility] Cardiac and Pulmonary Rehab  Referring Provider  Tereasa Coop MD      Encounter Date: 10/31/2017  Check In: Session Check In - 10/31/17 1133      Check-In   Location  [facility]-Cardiac & Pulmonary Rehab    Staff Present  Justin Mend Lorre Nick, [place], RCEP, CCRP, [name];Meredith Sherryll Burger, RN BSN    Supervising physician immediately available to respond to emergencies  [facility] immediately available ER MD    Physician(s)  Dr. Corky Downs and Alfred Levins    Medication changes reported      No    Fall or balance concerns reported     No    Tobacco Cessation  No Change    Warm-up and Cool-down  Performed as group-led instruction    Resistance Training Performed  Yes    VAD Patient?  No      Pain Assessment   Currently in Pain?  No/denies          Social History   Tobacco Use  Smoking Status Current Every Day Smoker  . Packs/day: 0.25  . Years: 25.00  . Pack years: 6.25  . Types: Cigarettes  Smokeless Tobacco Never Used  Tobacco Comment   given fake cigarette today, she is on wellbutrin and has patches on standby.    Goals Met:  Independence with exercise equipment Exercise tolerated well No report of cardiac concerns or symptoms Strength training completed today  Goals Unmet:  Not Applicable  Comments: Pt able to follow exercise prescription today without complaint.  Will continue to monitor for progression.   [name] is Medical Director for [place] and [facility].

## 2017-11-03 DIAGNOSIS — J439 Emphysema, unspecified: Secondary | ICD-10-CM

## 2017-11-03 NOTE — Progress Notes (Signed)
Daily Session Note  [name] MRN: [id number] Date of Birth: [birth year] Referring Provider:     Pulmonary Rehab from 06/17/2017 in [facility] Cardiac and Pulmonary Rehab  Referring Provider  Tereasa Coop MD      Encounter Date: 11/03/2017  Check In: Session Check In - 11/03/17 1143      Check-In   Location  [facility]-Cardiac & Pulmonary Rehab    Staff Present  [name],[name],[name];Amanda Oletta Darter, BA, [name], [name];Kelly Amedeo Plenty, BS, [name], [name]    Supervising physician immediately available to respond to emergencies  [facility] immediately available ER MD    Physician(s)  Dr. Jimmye Norman and Cinda Quest    Medication changes reported      No    Fall or balance concerns reported     No    Tobacco Cessation  No Change    Warm-up and Cool-down  Performed as group-led instruction    Resistance Training Performed  Yes    VAD Patient?  No      Pain Assessment   Currently in Pain?  No/denies          Social History   Tobacco Use  Smoking Status Current Every Day Smoker  . Packs/day: 0.25  . Years: 25.00  . Pack years: 6.25  . Types: Cigarettes  Smokeless Tobacco Never Used  Tobacco Comment   given fake cigarette today, she is on wellbutrin and has patches on standby.    Goals Met:  Independence with exercise equipment Exercise tolerated well No report of cardiac concerns or symptoms Strength training completed today  Goals Unmet:  Not Applicable  Comments: Pt able to follow exercise prescription today without complaint.  Will continue to monitor for progression.   [name] is Medical Director for [place] and [facility].

## 2017-11-05 ENCOUNTER — Encounter: Payer: BLUE CROSS/BLUE SHIELD | Admitting: *Deleted

## 2017-11-05 DIAGNOSIS — J439 Emphysema, unspecified: Secondary | ICD-10-CM | POA: Diagnosis not present

## 2017-11-05 NOTE — Progress Notes (Signed)
Daily Session Note  [name] MRN: [id number] Date of Birth: 1961/04/26 Referring Provider:     Pulmonary Rehab from 06/17/2017 in [facility] Cardiac and Pulmonary Rehab  Referring Provider  Tereasa Coop MD      Encounter Date: 11/05/2017  Check In: Session Check In - 11/05/17 1129      Check-In   Location  [facility] Pulmonary Re[facility]    Staff Present  Renita Papa, RN BSN;Jessica Luan Pulling, MA, RCEP, CCRP, [name];Joseph Flavia Shipper    Supervising physician immediately available to respond to emergencies  [facility] immediately available ER MD    Physician(s)  Dr. Jimmye Norman and Quentin Cornwall    Medication changes reported      No    Fall or balance concerns reported     No    Tobacco Cessation  No Change    Warm-up and Cool-down  Performed as group-led instruction    Resistance Training Performed  Yes    PAD/SET Patient?  No      Pain Assessment   Currently in Pain?  No/denies          Social History   Tobacco Use  Smoking Status Current Every Day Smoker  . Packs/day: 0.25  . Years: 25.00  . Pack years: 6.25  . Types: Cigarettes  Smokeless Tobacco Never Used  Tobacco Comment   given fake cigarette today, she is on wellbutrin and has patches on standby.    Goals Met:  Proper associated with RPD/PD & O2 Sat Independence with exercise equipment Using PLB without cueing & demonstrates good technique Exercise tolerated well Personal goals reviewed No report of cardiac concerns or symptoms Strength training completed today  Goals Unmet:  Not Applicable  Comments: Pt able to follow exercise prescription today without complaint.  Will continue to monitor for progression.    [name] is Medical Director for [place] and [facility].

## 2017-11-07 ENCOUNTER — Encounter: Payer: BLUE CROSS/BLUE SHIELD | Admitting: *Deleted

## 2017-11-07 DIAGNOSIS — J439 Emphysema, unspecified: Secondary | ICD-10-CM

## 2017-11-07 NOTE — Progress Notes (Signed)
Daily Session Note  [name] MRN: [id number] Date of Birth: 12-31-1960 Referring Provider:     Pulmonary Rehab from 06/17/2017 in [facility] Cardiac and Pulmonary Rehab  Referring Provider  Tereasa Coop MD      Encounter Date: 11/07/2017  Check In: Session Check In - 11/07/17 1138      Check-In   Location  [facility]-Cardiac & Pulmonary Rehab    Staff Present  Renita Papa, RN BSN;Jessica Luan Pulling, MA, RCEP, CCRP, [name];Joseph Flavia Shipper    Supervising physician immediately available to respond to emergencies  [facility] immediately available ER MD    Physician(s)  Dr. Cherylann Banas and Jacqualine Code    Medication changes reported      No    Fall or balance concerns reported     No    Tobacco Cessation  No Change    Warm-up and Cool-down  Performed as group-led instruction    Resistance Training Performed  Yes    VAD Patient?  No      Pain Assessment   Currently in Pain?  No/denies          Social History   Tobacco Use  Smoking Status Current Every Day Smoker  . Packs/day: 0.25  . Years: 25.00  . Pack years: 6.25  . Types: Cigarettes  Smokeless Tobacco Never Used  Tobacco Comment   given fake cigarette today, she is on wellbutrin and has patches on standby.    Goals Met:  Proper associated with RPD/PD & O2 Sat Independence with exercise equipment Using PLB without cueing & demonstrates good technique Exercise tolerated well No report of cardiac concerns or symptoms Strength training completed today  Goals Unmet:  Not Applicable  Comments: Pt able to follow exercise prescription today without complaint.  Will continue to monitor for progression.    [name] is Medical Director for [place] and [facility].

## 2017-11-10 DIAGNOSIS — J439 Emphysema, unspecified: Secondary | ICD-10-CM

## 2017-11-10 NOTE — Progress Notes (Signed)
Pulmonary Individual Treatment Plan  [name] MRN: [id number] Date of Birth: 1961-02-14 Referring Provider:     Pulmonary Rehab from 06/17/2017 in [facility] Cardiac and Pulmonary Rehab  Referring Provider  Tereasa Coop MD      Initial Encounter Date:    Pulmonary Rehab from 06/17/2017 in [facility] Cardiac and Pulmonary Rehab  Date  06/17/17      Visit Diagnosis: Pulmonary emphysema, unspecified emphysema type ([place])  Patient's Home Medications on Admission:  Current Outpatient Medications:  .  albuterol (PROVENTIL HFA;VENTOLIN HFA) 108 (90 Base) MCG/ACT inhaler, Inhale 2 puffs into the lungs every 6 (six) hours as needed for wheezing or shortness of breath., Disp: , Rfl:  .  budesonide-formoterol (SYMBICORT) 160-4.5 MCG/ACT inhaler, Inhale 2 puffs into the lungs 2 (two) times daily., Disp: , Rfl:  .  buprenorphine-naloxone (SUBOXONE) 8-2 MG SUBL SL tablet, Place 1 tablet under the tongue 2 (two) times daily., Disp: , Rfl:  .  citalopram (CELEXA) 40 MG tablet, Take 40 mg by mouth daily., Disp: , Rfl:  .  estradiol (VIVELLE-DOT) 0.0375 MG/24HR, Place 1 patch onto the skin 2 (two) times a week., Disp: , Rfl:  .  fexofenadine (ALLEGRA) 180 MG tablet, Take 180 mg by mouth daily as needed for allergies or rhinitis., Disp: , Rfl:  .  hydrochlorothiazide (HYDRODIURIL) 25 MG tablet, Take 25 mg by mouth daily., Disp: , Rfl:  .  hydrOXYzine (ATARAX/VISTARIL) 25 MG tablet, Take 25 mg by mouth daily as needed for itching., Disp: , Rfl:  .  ibuprofen (ADVIL,MOTRIN) 800 MG tablet, Take 800 mg by mouth 2 (two) times daily as needed., Disp: , Rfl:  .  lamoTRIgine (LAMICTAL) 25 MG tablet, Take 25 mg by mouth 2 (two) times daily., Disp: , Rfl:  .  lisinopril (PRINIVIL,ZESTRIL) 40 MG tablet, Take 40 mg by mouth daily., Disp: , Rfl:  .  LORazepam (ATIVAN) 1 MG tablet, Take 1 mg by mouth 2 (two) times daily as needed for anxiety., Disp: , Rfl:  .  omeprazole (PRILOSEC) 20 MG capsule, Take 20  mg by mouth daily., Disp: , Rfl:  .  progesterone (PROMETRIUM) 100 MG capsule, Take 100 mg by mouth at bedtime., Disp: , Rfl:  .  Secukinumab (COSENTYX 300 DOSE) 150 MG/ML SOSY, Inject into the skin every 30 (thirty) days. Last dose 04/02/16, Disp: , Rfl:  .  spironolactone (ALDACTONE) 25 MG tablet, Take 25 mg by mouth daily., Disp: , Rfl:   Past Medical History: Past Medical History:  Diagnosis Date  . Asthma   . COPD (chronic obstructive pulmonary disease) ([place])   . Degenerative disc disease, cervical   . Depression   . GERD (gastroesophageal reflux disease)   . Hypertension   . Osteoarthritis   . Psoriatic arthritis ([place])   . Wears contact lenses    right eye    Tobacco Use: Social History   Tobacco Use  Smoking Status Current Every Day Smoker  . Packs/day: 0.25  . Years: 25.00  . Pack years: 6.25  . Types: Cigarettes  Smokeless Tobacco Never Used  Tobacco Comment   given fake cigarette today, she is on wellbutrin and has patches on standby.    Labs: Recent Review Flowsheet Data    There is no flowsheet data to display.       Pulmonary Assessment Scores: Pulmonary Assessment Scores    Row Name 06/17/17 1521         ADL UCSD   ADL Phase  Entry     SOB Score total  44     Rest  0     Walk  2     Stairs  4     Bath  2     Dress  1     Shop  3       CAT Score   CAT Score  21       mMRC Score   mMRC Score  3        Pulmonary Function Assessment: Pulmonary Function Assessment - 06/17/17 1522      Breath   Bilateral Breath Sounds  Wheezes    Shortness of Breath  Yes;Fear of Shortness of Breath;Limiting activity       Exercise Target Goals:    Exercise Program Goal: Individual exercise prescription set using results from initial 6 min walk test and THRR while considering  patient's activity barriers and safety.    Exercise Prescription Goal: Initial exercise prescription builds to 30-45 minutes a day of aerobic activity, 2-3 days per week.   Home exercise guidelines will be given to patient during program as part of exercise prescription that the participant will acknowledge.  Activity Barriers & Risk Stratification: Activity Barriers & Cardiac Risk Stratification - 06/17/17 1623      Activity Barriers & Cardiac Risk Stratification   Activity Barriers  Arthritis;Joint Problems;History of Falls;Muscular Weakness;Deconditioning;Shortness of Breath soriatic arthritis, knee pain       6 Minute Walk: 6 Minute Walk    Row Name 06/17/17 1620         6 Minute Walk   Phase  Initial     Distance  894 feet     Walk Time  6 minutes     # of Rest Breaks  0     MPH  1.69     METS  2.17     RPE  13     Perceived Dyspnea   2     VO2 Peak  7.6     Symptoms  Yes (comment)     Comments  SOB, hip pain 5-6/10, back pain 3/10     Resting HR  69 bpm     Resting BP  136/74     Resting Oxygen Saturation   93 % 88% on Room Air     Exercise Oxygen Saturation  during 6 min walk  88 %     Max Ex. HR  127 bpm     Max Ex. BP  146/84     2 Minute Post BP  134/76       Interval HR   1 Minute HR  86     2 Minute HR  111     3 Minute HR  107     4 Minute HR  127     5 Minute HR  108     6 Minute HR  112     2 Minute Post HR  75     Interval Heart Rate?  Yes       Interval Oxygen   Interval Oxygen?  Yes     Baseline Oxygen Saturation %  93 %     1 Minute Oxygen Saturation %  89 %     1 Minute Liters of Oxygen  3 L     2 Minute Oxygen Saturation %  88 %     2 Minute Liters of Oxygen  3 L     3 Minute  Oxygen Saturation %  89 %     3 Minute Liters of Oxygen  3 L     4 Minute Oxygen Saturation %  88 %     4 Minute Liters of Oxygen  3 L     5 Minute Oxygen Saturation %  89 %     5 Minute Liters of Oxygen  3 L     6 Minute Oxygen Saturation %  88 %     6 Minute Liters of Oxygen  3 L     2 Minute Post Oxygen Saturation %  96 %     2 Minute Post Liters of Oxygen  3 L       Oxygen Initial Assessment: Oxygen Initial Assessment -  06/17/17 1523      Home Oxygen   Home Oxygen Device  E-Tanks she is just now getting home oxygen and needs to be evaluated    Sleep Oxygen Prescription  None    Home Exercise Oxygen Prescription  None    Home at Rest Exercise Oxygen Prescription  None      Initial 6 min Walk   Oxygen Used  Continuous;E-Tanks    Liters per minute  3      Program Oxygen Prescription   Program Oxygen Prescription  Continuous;E-Tanks    Liters per minute  3    Comments  patient is getting home oxygen and is suppose to use 3 liters.       Intervention   Short Term Goals  To learn and exhibit compliance with exercise, home and travel O2 prescription;To learn and understand importance of maintaining oxygen saturations>88%;To learn and demonstrate proper use of respiratory medications;To learn and demonstrate proper pursed lip breathing techniques or other breathing techniques.;To learn and understand importance of monitoring SPO2 with pulse oximeter and demonstrate accurate use of the pulse oximeter. inhaler at home. Albuterol prn    Long  Term Goals  Exhibits compliance with exercise, home and travel O2 prescription;Verbalizes importance of monitoring SPO2 with pulse oximeter and return demonstration;Maintenance of O2 saturations>88%;Exhibits proper breathing techniques, such as pursed lip breathing or other method taught during program session;Compliance with respiratory medication;Demonstrates proper use of MDI's       Oxygen Re-Evaluation: Oxygen Re-Evaluation    Row Name 06/25/17 1125 07/25/17 1158 09/10/17 1235 11/05/17 1212       Program Oxygen Prescription   Program Oxygen Prescription  Continuous;E-Tanks  Continuous;E-Tanks  Continuous;E-Tanks  Continuous;E-Tanks    Liters per minute  _0 Comments  patient is getting home oxygen and is suppose to use 3 liters.   -  patient is getting home oxygen and is suppose to use 3 liters.   -      Home Oxygen   Home Oxygen Device  E-Tanks  E-Tanks   E-Tanks  E-Tanks    Sleep Oxygen Prescription  -  None  None  Continuous    Liters per minute  -  -  -  4    Home Exercise Oxygen Prescription  None  None  None  None    Home at Rest Exercise Oxygen Prescription  None  None  None  -    Compliance with Home Oxygen Use  Yes  Yes  Yes  No      Goals/Expected Outcomes   Short Term Goals  To learn and exhibit compliance with exercise, home and travel O2 prescription;To learn and understand importance of maintaining  oxygen saturations>88%;To learn and demonstrate proper use of respiratory medications;To learn and demonstrate proper pursed lip breathing techniques or other breathing techniques.;To learn and understand importance of monitoring SPO2 with pulse oximeter and demonstrate accurate use of the pulse oximeter.  To learn and exhibit compliance with exercise, home and travel O2 prescription;To learn and understand importance of maintaining oxygen saturations>88%;To learn and demonstrate proper use of respiratory medications;To learn and demonstrate proper pursed lip breathing techniques or other breathing techniques.;To learn and understand importance of monitoring SPO2 with pulse oximeter and demonstrate accurate use of the pulse oximeter.  To learn and exhibit compliance with exercise, home and travel O2 prescription;To learn and understand importance of maintaining oxygen saturations>88%;To learn and demonstrate proper use of respiratory medications;To learn and demonstrate proper pursed lip breathing techniques or other breathing techniques.;To learn and understand importance of monitoring SPO2 with pulse oximeter and demonstrate accurate use of the pulse oximeter.  To learn and exhibit compliance with exercise, home and travel O2 prescription;To learn and understand importance of maintaining oxygen saturations>88%;To learn and demonstrate proper use of respiratory medications;To learn and demonstrate proper pursed lip breathing techniques or other  breathing techniques.;To learn and understand importance of monitoring SPO2 with pulse oximeter and demonstrate accurate use of the pulse oximeter.    Long  Term Goals  Exhibits compliance with exercise, home and travel O2 prescription;Verbalizes importance of monitoring SPO2 with pulse oximeter and return demonstration;Maintenance of O2 saturations>88%;Exhibits proper breathing techniques, such as pursed lip breathing or other method taught during program session;Compliance with respiratory medication;Demonstrates proper use of MDI's  Exhibits compliance with exercise, home and travel O2 prescription;Verbalizes importance of monitoring SPO2 with pulse oximeter and return demonstration;Maintenance of O2 saturations>88%;Exhibits proper breathing techniques, such as pursed lip breathing or other method taught during program session;Compliance with respiratory medication;Demonstrates proper use of MDI's  Exhibits compliance with exercise, home and travel O2 prescription;Verbalizes importance of monitoring SPO2 with pulse oximeter and return demonstration;Maintenance of O2 saturations>88%;Exhibits proper breathing techniques, such as pursed lip breathing or other method taught during program session;Compliance with respiratory medication;Demonstrates proper use of MDI's  Exhibits compliance with exercise, home and travel O2 prescription;Verbalizes importance of monitoring SPO2 with pulse oximeter and return demonstration;Maintenance of O2 saturations>88%;Exhibits proper breathing techniques, such as pursed lip breathing or other method taught during program session;Compliance with respiratory medication;Demonstrates proper use of MDI's    Comments  Reviewed PLB technique with pt.  Talked about how it work and it's important to maintaining his exercise saturations.    Dana Hill is using her oxygen correctly and managing it well. She is also continuing to use PLB here and outside of LW. Her medications are being used  properly.   Dana Hill is continuing to use her oxygen appropriately. She feels more confident in managing it independently. She is practicing PLB when needed. She reports not having to use her rescue inhaler as much, and that her new daily inhaler is working well  Dana Hill has been imrving with her breathing since she started in the program. Steps are still a big issue with her shortness of breath. She has been working on PLB at home and has been doing well at it. She is not checking her oxygen at home that much she states, Informed her to check her oxygen at home if she is not going to wear any.    Goals/Expected Outcomes  Short: Become more profiecient at using PLB.   Long: Become independent at using PLB.  Short: continue to work on  PLB. Long: continue to manage oxygen and respiratory medication.   Short: continue to work on PLB and use oxygen appropirately. Long: become indpenedent with PLB   Short: check oxygen at home more oftern. Long: maintain checking oxygen at home independently.       Oxygen Discharge (Final Oxygen Re-Evaluation): Oxygen Re-Evaluation - 11/05/17 1212      Program Oxygen Prescription   Program Oxygen Prescription  Continuous;E-Tanks    Liters per minute  3      Home Oxygen   Home Oxygen Device  E-Tanks    Sleep Oxygen Prescription  Continuous    Liters per minute  4    Home Exercise Oxygen Prescription  None    Compliance with Home Oxygen Use  No      Goals/Expected Outcomes   Short Term Goals  To learn and exhibit compliance with exercise, home and travel O2 prescription;To learn and understand importance of maintaining oxygen saturations>88%;To learn and demonstrate proper use of respiratory medications;To learn and demonstrate proper pursed lip breathing techniques or other breathing techniques.;To learn and understand importance of monitoring SPO2 with pulse oximeter and demonstrate accurate use of the pulse oximeter.    Long  Term Goals  Exhibits compliance with  exercise, home and travel O2 prescription;Verbalizes importance of monitoring SPO2 with pulse oximeter and return demonstration;Maintenance of O2 saturations>88%;Exhibits proper breathing techniques, such as pursed lip breathing or other method taught during program session;Compliance with respiratory medication;Demonstrates proper use of MDI's    Comments  Dana Hill has been imrving with her breathing since she started in the program. Steps are still a big issue with her shortness of breath. She has been working on PLB at home and has been doing well at it. She is not checking her oxygen at home that much she states, Informed her to check her oxygen at home if she is not going to wear any.    Goals/Expected Outcomes  Short: check oxygen at home more oftern. Long: maintain checking oxygen at home independently.       Initial Exercise Prescription: Initial Exercise Prescription - 06/17/17 1600      Date of Initial Exercise RX and Referring Provider   Date  06/17/17    Referring Provider  Tereasa Coop MD      Oxygen   Oxygen  Continuous    Liters  3-4 4L on treadmill      Treadmill   MPH  1.5    Grade  0.5    Minutes  15    METs  2.25      Recumbant Elliptical   Level  1    RPM  50    Minutes  15    METs  2.1      T5 Nustep   Level  1    SPM  80    Minutes  15    METs  2.1      Prescription Details   Frequency (times per week)  3    Duration  Progress to 45 minutes of aerobic exercise without signs/symptoms of physical distress      Intensity   THRR 40-80% of Max Heartrate  106-143    Ratings of Perceived Exertion  11-13    Perceived Dyspnea  0-4      Progression   Progression  Continue to progress workloads to maintain intensity without signs/symptoms of physical distress.      Resistance Training   Training Prescription  Yes    Weight  3 lbs    Reps  10-15       Perform Capillary Blood Glucose checks as needed.  Exercise Prescription Changes: Exercise  Prescription Changes    Row Name 06/17/17 1600 07/07/17 1600 07/18/17 1200 07/22/17 1400 08/05/17 1500     Response to Exercise   Blood Pressure (Admit)  136/74  130/70  -  124/76  124/80   Blood Pressure (Exercise)  146/84  -  -  -  -   Blood Pressure (Exit)  134/76  128/78  -  130/80  116/74   Heart Rate (Admit)  69 bpm  81 bpm  -  80 bpm  80 bpm   Heart Rate (Exercise)  127 bpm  96 bpm  -  89 bpm  85 bpm   Heart Rate (Exit)  75 bpm  73 bpm  -  72 bpm  71 bpm   Oxygen Saturation (Admit)  93 %  88 %  -  92 %  91 %   Oxygen Saturation (Exercise)  99 %  88 %  -  91 %  91 %   Oxygen Saturation (Exit)  96 %  95 %  -  96 %  94 %   Rating of Perceived Exertion (Exercise)  13  13  -  13  13   Perceived Dyspnea (Exercise)  2  1  -  0  0   Symptoms  hip pain 6/10, back pain 3/10, SOB  none  -  none  none   Comments  walk test results  -  -  -  -   Duration  -  Continue with 45 min of aerobic exercise without signs/symptoms of physical distress.  -  Continue with 45 min of aerobic exercise without signs/symptoms of physical distress.  Continue with 45 min of aerobic exercise without signs/symptoms of physical distress.   Intensity  -  THRR unchanged  -  THRR unchanged  THRR unchanged     Progression   Progression  -  Continue to progress workloads to maintain intensity without signs/symptoms of physical distress.  -  Continue to progress workloads to maintain intensity without signs/symptoms of physical distress.  Continue to progress workloads to maintain intensity without signs/symptoms of physical distress.   Average METs  -  2.73  -  1.95  2.57     Resistance Training   Training Prescription  -  Yes  -  Yes  Yes   Weight  -  3 lbs  -  3 lbs  4 lbs   Reps  -  10-15  -  10-15  10-15     Interval Training   Interval Training  -  No  -  No  No     Oxygen   Oxygen  -  Continuous  -  Continuous  Continuous   Liters  -  2-3 3L on treadmill  -  3  2     Treadmill   MPH  -  1.5  -  1.5  2    Grade  -  0.5  -  0.5  1   Minutes  -  15  -  15  15   METs  -  2.25  -  2.25  2.81     Recumbant Elliptical   Level  -  1  -  2  2   RPM  -  39  -  33  50   Minutes  -  15  -  15  15   METs  -  1.3  -  1.6  2     T5 Nustep   Level  -  1  -  2  3   SPM  -  100  -  121  97   Minutes  -  15  -  15  15   METs  -  1.9  -  2  2.9     Home Exercise Plan   Plans to continue exercise at  -  -  Home (comment) gym in complex, treadmill, stairmill, walking  Home (comment) gym in complex, treadmill, stairmill, walking  Home (comment) gym in complex, treadmill, stairmill, walking   Frequency  -  -  Add 1 additional day to program exercise sessions.  Add 1 additional day to program exercise sessions.  Add 1 additional day to program exercise sessions.   Initial Home Exercises Provided  -  -  07/18/17  07/18/17  07/18/17   Row Name 08/18/17 1500 09/02/17 1400 09/16/17 1600 10/15/17 1500 10/29/17 1400     Response to Exercise   Blood Pressure (Admit)  130/74  132/82  132/72  124/68  130/80   Blood Pressure (Exit)  122/78  122/76  126/70  116/68  110/70   Heart Rate (Admit)  77 bpm  83 bpm  81 bpm  72 bpm  71 bpm   Heart Rate (Exercise)  89 bpm  97 bpm  97 bpm  94 bpm  92 bpm   Heart Rate (Exit)  93 bpm  73 bpm  86 bpm  71 bpm  72 bpm   Oxygen Saturation (Admit)  94 %  95 %  96 %  97 %  97 %   Oxygen Saturation (Exercise)  89 %  90 %  90 %  92 %  90 %   Oxygen Saturation (Exit)  95 %  96 %  93 %  97 %  97 %   Rating of Perceived Exertion (Exercise)  _0 130   Perceived Dyspnea (Exercise)  0  _1 0   Symptoms  none  none  none  none  none   Duration  Continue with 45 min of aerobic exercise without signs/symptoms of physical distress.  Continue with 45 min of aerobic exercise without signs/symptoms of physical distress.  Continue with 45 min of aerobic exercise without signs/symptoms of physical distress.  Continue with 45 min of aerobic exercise without signs/symptoms of physical  distress.  Continue with 45 min of aerobic exercise without signs/symptoms of physical distress.   Intensity  THRR unchanged  THRR unchanged  THRR unchanged  THRR unchanged  THRR unchanged     Progression   Progression  Continue to progress workloads to maintain intensity without signs/symptoms of physical distress.  Continue to progress workloads to maintain intensity without signs/symptoms of physical distress.  Continue to progress workloads to maintain intensity without signs/symptoms of physical distress.  Continue to progress workloads to maintain intensity without signs/symptoms of physical distress.  Continue to progress workloads to maintain intensity without signs/symptoms of physical distress.   Average METs  2.23  2.46  2.27  2.35  2.27     Resistance Training   Training Prescription  Yes  Yes  Yes  Yes  Yes   Weight  4 lbs  4 lbs  4 lbs  4 lb  4  lbs   Reps  10-15  10-15  10-15  10-15  10-15     Interval Training   Interval Training  No  -  No  No  No     Oxygen   Oxygen  Continuous  Continuous  Continuous  Continuous  Continuous   Liters  _0 Treadmill   MPH  1.8  2  1.8  1.8  1.8   Grade  0.5  0.5  0.5  0.5  0.5   Minutes  _1 METs  2.5  2.67  2.5  2.5  2.5     Recumbant Elliptical   Level  _2 RPM  44  46  41  40  42   Minutes  _3 METs  2.1  2.6  2.2  2.2  2.3     T5 Nustep   Level  _4 -  4   SPM  101  91  91  -  -   Minutes  _5 -  15   METs  2.1  2.1  2.1  -  2     Home Exercise Plan   Plans to continue exercise at  Home (comment) gym in complex, treadmill, stairmill, walking  Home (comment) gym in complex, treadmill, stairmill, walking  Home (comment) gym in complex, treadmill, stairmill, walking  Home (comment) gym in complex, treadmill, stairmill, walking  Home (comment) gym in complex, treadmill, stairmill, walking   Frequency  Add 1 additional day to program exercise sessions.  Add  1 additional day to program exercise sessions.  Add 1 additional day to program exercise sessions.  Add 1 additional day to program exercise sessions.  Add 2 additional days to program exercise sessions.   Initial Home Exercises Provided  07/18/17  07/18/17  07/18/17  07/18/17  07/18/17      Exercise Comments: Exercise Comments    Row Name 06/25/17 1124           Exercise Comments  First full day of exercise!  Patient was oriented to gym and equipment including functions, settings, policies, and procedures.  Patient's individual exercise prescription and treatment plan were reviewed.  All starting workloads were established based on the results of the 6 minute walk test done at initial orientation visit.  The plan for exercise progression was also introduced and progression will be customized based on patient's performance and goals.          Exercise Goals and Review: Exercise Goals    Row Name 06/17/17 1628             Exercise Goals   Increase Physical Activity  Yes       Intervention  Provide advice, education, support and counseling about physical activity/exercise needs.;Develop an individualized exercise prescription for aerobic and resistive training based on initial evaluation findings, risk stratification, comorbidities and participant's personal goals.       Expected Outcomes  Long Term: Add in home exercise to make exercise part of routine and to increase amount of physical activity.;Short Term: Attend rehab on a regular basis to increase amount of physical activity.;Long Term: Exercising regularly at least 3-5 days a week.       Increase  Strength and Stamina  Yes       Intervention  Provide advice, education, support and counseling about physical activity/exercise needs.;Develop an individualized exercise prescription for aerobic and resistive training based on initial evaluation findings, risk stratification, comorbidities and participant's personal goals.       Expected  Outcomes  Short Term: Increase workloads from initial exercise prescription for resistance, speed, and METs.;Short Term: Perform resistance training exercises routinely during rehab and add in resistance training at home;Long Term: Improve cardiorespiratory fitness, muscular endurance and strength as measured by increased METs and functional capacity (6MWT)       Able to understand and use rate of perceived exertion (RPE) scale  Yes       Intervention  Provide education and explanation on how to use RPE scale       Expected Outcomes  Short Term: Able to use RPE daily in rehab to express subjective intensity level;Long Term:  Able to use RPE to guide intensity level when exercising independently       Able to understand and use Dyspnea scale  Yes       Intervention  Provide education and explanation on how to use Dyspnea scale       Expected Outcomes  Short Term: Able to use Dyspnea scale daily in rehab to express subjective sense of shortness of breath during exertion;Long Term: Able to use Dyspnea scale to guide intensity level when exercising independently       Knowledge and understanding of Target Heart Rate Range (THRR)  Yes       Intervention  Provide education and explanation of THRR including how the numbers were predicted and where they are located for reference       Expected Outcomes  Short Term: Able to state/look up THRR;Short Term: Able to use daily as guideline for intensity in rehab;Long Term: Able to use THRR to govern intensity when exercising independently       Able to check pulse independently  Yes       Intervention  Provide education and demonstration on how to check pulse in carotid and radial arteries.;Review the importance of being able to check your own pulse for safety during independent exercise       Expected Outcomes  Short Term: Able to explain why pulse checking is important during independent exercise;Long Term: Able to check pulse independently and accurately        Understanding of Exercise Prescription  Yes       Intervention  Provide education, explanation, and written materials on patient's individual exercise prescription       Expected Outcomes  Short Term: Able to explain program exercise prescription;Long Term: Able to explain home exercise prescription to exercise independently          Exercise Goals Re-Evaluation : Exercise Goals Re-Evaluation    [place] Name 06/25/17 1124 07/07/17 1621 07/18/17 1219 07/25/17 1156 08/05/17 1503     Exercise Goal Re-Evaluation   Exercise Goals Review  Understanding of Exercise Prescription;Able to understand and use Dyspnea scale;Knowledge and understanding of Target Heart Rate Range (THRR);Able to understand and use rate of perceived exertion (RPE) scale  Increase Physical Activity;Understanding of Exercise Prescription;Increase Strength and Stamina  Increase Physical Activity;Understanding of Exercise Prescription;Increase Strength and Stamina;Able to understand and use Dyspnea scale;Knowledge and understanding of Target Heart Rate Range (THRR);Able to understand and use rate of perceived exertion (RPE) scale;Able to check pulse independently  Increase Physical Activity;Understanding of Exercise Prescription;Increase Strength and Stamina;Able to understand  and use Dyspnea scale;Knowledge and understanding of Target Heart Rate Range (THRR);Able to understand and use rate of perceived exertion (RPE) scale;Able to check pulse independently  Increase Physical Activity;Understanding of Exercise Prescription;Increase Strength and Stamina   Comments  Reviewed RPE scale, THR and program prescription with pt today.  Pt voiced understanding and was given a copy of goals to take home.   Dana Hill has been doing well in rehab.  She is now using 3L on the treadmill to help improve her saturations.  We will continue to monitor her progress.   Reviewed home exercise with pt today.  Pt plans to walk and go to gym in complex for exercise.  She  will have access to a treadmill and stairmill. Reviewed THR, pulse, RPE, sign and symptoms, and when to call 911 or MD.  Also discussed weather considerations and indoor options.  Pt voiced understanding.  Dana Hill reviewed what was said during home exercise last week. She said she went to the gym at her complex and plans to go tomorrow to add another day of exercise  Dana Hill has been doing well in rehab.  She is now up to 2.0 mph on the treadmill and level 3 on the T5 NuStep.  We will continue to monitor her progression.     Expected Outcomes  Short: Use RPE daily to regulate intensity.  Long: Follow program prescription in THR.  Short: Continue to attend regularly.  Long: Continue to work on IT sales professional.   Short: Start going to gym 1 extra day a week.  Long: Exercise more on off days from rehab.   Short: continue to exercise one extra day outside of LW. Long: exercise independently.   Short: Continue to add in her home exercise at least one extra day a week.  Long: Continue to build strength and stamina.    [place] Name 08/18/17 1536 09/02/17 1423 09/16/17 1610 09/30/17 1456 10/15/17 1514     Exercise Goal Re-Evaluation   Exercise Goals Review  Increase Physical Activity;Understanding of Exercise Prescription;Increase Strength and Stamina  Increase Physical Activity;Understanding of Exercise Prescription;Increase Strength and Stamina  Increase Physical Activity;Understanding of Exercise Prescription;Increase Strength and Stamina  -  Increase Physical Activity;Able to understand and use rate of perceived exertion (RPE) scale;Increase Strength and Stamina;Able to understand and use Dyspnea scale   Comments  Dana Hill continues to do well in rehab.  She had done level 4 on T5 NuStep but went back to level 3 and went back down to 1.22mh on the treadmill.  We will talk to her about these at next visit. We will continue to monitor her progression.  RAizlynncontinues to do well in rehab.  She had done level 4 on T5  NuStep but went back to level 3 and went back down to 1.843m on the treadmill.  We will talk to her about these at next visit. We will continue to monitor her progression.  RoRainyas been out for a week after dermatologic surgery.  She has only attended once since last reveiw.  We will continue to monitor her progression.   Out since last review.  Plans to return tomorrow 5/22.  Pt has only attended twice in May.  Regular attendance is required for her to make progress.     Expected Outcomes   Short: Return workloads back to where they should be.  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 Long: Continue to build strength  and stamina.   Short: Return to rehab regularly.  Long: Continue to increase physical activity.   -  Short - Pt will attend regularly Long - Pt will increase MET level   Row Name 10/29/17 1457             Exercise Goal Re-Evaluation   Exercise Goals Review  Increase Physical Activity;Increase Strength and Stamina;Understanding of Exercise Prescription       Comments  Dana Hill has only attended once since last review.  She was able to maintain her workloads.  We will continue to monitor her progress.        Expected Outcomes  Short: Return to regular attendance.  Long: Continue to work on Press photographer.           Discharge Exercise Prescription (Final Exercise Prescription Changes): Exercise Prescription Changes - 10/29/17 1400      Response to Exercise   Blood Pressure (Admit)  130/80    Blood Pressure (Exit)  110/70    Heart Rate (Admit)  71 bpm    Heart Rate (Exercise)  92 bpm    Heart Rate (Exit)  72 bpm    Oxygen Saturation (Admit)  97 %    Oxygen Saturation (Exercise)  90 %    Oxygen Saturation (Exit)  97 %    Rating of Perceived Exertion (Exercise)  130    Perceived Dyspnea (Exercise)  0    Symptoms  none    Duration  Continue with 45 min of aerobic exercise without signs/symptoms of physical  distress.    Intensity  THRR unchanged      Progression   Progression  Continue to progress workloads to maintain intensity without signs/symptoms of physical distress.    Average METs  2.27      Resistance Training   Training Prescription  Yes    Weight  4 lbs    Reps  10-15      Interval Training   Interval Training  No      Oxygen   Oxygen  Continuous    Liters  2      Treadmill   MPH  1.8    Grade  0.5    Minutes  15    METs  2.5      Recumbant Elliptical   Level  4    RPM  42    Minutes  15    METs  2.3      T5 Nustep   Level  4    Minutes  15    METs  2      Home Exercise Plan   Plans to continue exercise at  Home (comment) gym in complex, treadmill, stairmill, walking    Frequency  Add 2 additional days to program exercise sessions.    Initial Home Exercises Provided  07/18/17       Nutrition:  Target Goals: Understanding of nutrition guidelines, daily intake of sodium '1500mg'$ , cholesterol '200mg'$ , calories 30% from fat and 7% or less from saturated fats, daily to have 5 or more servings of fruits and vegetables.  Biometrics: Pre Biometrics - 06/17/17 1629      Pre Biometrics   Height  5' 3.5" (1.613 m)    Weight  263 lb 3.2 oz (119.4 kg)    Waist Circumference  46 inches    Hip Circumference  59.25 inches    Waist to Hip Ratio  0.78 %    BMI (Calculated)  45.89  Nutrition Therapy Plan and Nutrition Goals: Nutrition Therapy & Goals - 06/17/17 1520      Personal Nutrition Goals   Comments  weight loss, eating healthier and learning about healthier options.      Intervention Plan   Intervention  Prescribe, educate and counsel regarding individualized specific dietary modifications aiming towards targeted core components such as weight, hypertension, lipid management, diabetes, heart failure and other comorbidities.;Nutrition handout(s) given to patient.    Expected Outcomes  Short Term Goal: Understand basic principles of dietary content,  such as calories, fat, sodium, cholesterol and nutrients.;Short Term Goal: A plan has been developed with personal nutrition goals set during dietitian appointment.;Long Term Goal: Adherence to prescribed nutrition plan.       Nutrition Assessments: Nutrition Assessments - 06/17/17 1520      MEDFICTS Scores   Pre Score  62       Nutrition Goals Re-Evaluation: Nutrition Goals Re-Evaluation    Dana Hill Name 07/14/17 1157 07/25/17 1153 09/10/17 1228 11/05/17 1219       Goals   Current Weight  264 lb (119.7 kg)  261 lb (118.4 kg)  262 lb (118.8 kg)  268 lb (121.6 kg)    Nutrition Goal  -  -  -  Weight loss, learn what nutrition she is getting, Be more consistant with diet.    Comment  Dana Hill will schedule with RD and folow her recommendations  Dana Hill had to miss her initial appointment with the RD due to home issues, but has a second appointment the end of this month. She reported not eating out as much and watching her portion sizes.   Dana Hill did not want to reschedule her nutrition appointment, but knows she can change her mind at any point. She reports that she is being more mindful of food labels and cooking healthier options at home  Dana Hill states she knows what to do with her diet but she is not consistent. She wants to lose weight.    Expected Outcome  Short - Dana Hill will meet with RD  Long - Dana Hill will continue to lose weight  Short: Dana Hill will meet with the dietician. Long: Dana Hill will work on eating healthier and get to her goal weight.   Short: Dana Hill will attend the General Nutrition class in [facility]. Long: Dana Hill will become independent in eaiting a healthy diet and reach her goal weight.   Short: lose 2 pounds by Monday. Long: lose 5 pounds before she graduates [facility].       Nutrition Goals Discharge (Final Nutrition Goals Re-Evaluation): Nutrition Goals Re-Evaluation - 11/05/17 1219      Goals   Current Weight  268 lb (121.6 kg)    Nutrition Goal  Weight loss, learn what nutrition  she is getting, Be more consistant with diet.    Comment  Aaylah states she knows what to do with her diet but she is not consistent. She wants to lose weight.    Expected Outcome  Short: lose 2 pounds by Monday. Long: lose 5 pounds before she graduates [facility].       Psychosocial: Target Goals: Acknowledge presence or absence of significant depression and/or stress, maximize coping skills, provide positive support system. Participant is able to verbalize types and ability to use techniques and skills needed for reducing stress and depression.   Initial Review & Psychosocial Screening: Initial Psych Review & Screening - 06/17/17 1517      Initial Review   Current issues with  Current Depression;History of Depression;Current Psychotropic  Meds;Current Stress Concerns    Source of Stress Concerns  Chronic Illness;Unable to perform yard/household activities;Family    Comments  Her children do not know that she has emphysema.      Family Dynamics   Good Support System?  Yes    Comments  Her boyfriend of 15 years is a great support system for her.      Barriers   Psychosocial barriers to participate in program  Psychosocial barriers identified (see note);The patient should benefit from training in stress management and relaxation.      Screening Interventions   Interventions  Encouraged to exercise;Provide feedback about the scores to participant;Program counselor consult;To provide support and resources with identified psychosocial needs    Expected Outcomes  Short Term goal: Utilizing psychosocial counselor, staff and physician to assist with identification of specific Stressors or current issues interfering with healing process. Setting desired goal for each stressor or current issue identified.;Long Term Goal: Stressors or current issues are controlled or eliminated.;Short Term goal: Identification and review with participant of any Quality of Life or Depression concerns found by scoring the  questionnaire.;Long Term goal: The participant improves quality of Life and PHQ9 Scores as seen by post scores and/or verbalization of changes       Quality of Life Scores:  Scores of 19 and below usually indicate a poorer quality of life in these areas.  A difference of  2-3 points is a clinically meaningful difference.  A difference of 2-3 points in the total score of the Quality of Life Index has been associated with significant improvement in overall quality of life, self-image, physical symptoms, and general health in studies assessing change in quality of life.  PHQ-9: Recent Review Flowsheet Data    Depression screen [facility] 2/9 06/17/2017   Decreased Interest 1   Down, Depressed, Hopeless 0   PHQ - 2 Score 1   Altered sleeping 0   Tired, decreased energy 2   Change in appetite 1   Feeling bad or failure about yourself  2   Trouble concentrating 0   Moving slowly or fidgety/restless 0   Suicidal thoughts 0   PHQ-9 Score 6   Difficult doing work/chores Somewhat difficult     Interpretation of Total Score  Total Score Depression Severity:  1-4 = Minimal depression, 5-9 = Mild depression, 10-14 = Moderate depression, 15-19 = Moderately severe depression, 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 Nyoka Cowden Shirlean Mylar) today for initial psychosocial evaluation.  She is a 57 year old who struggles with COPD.  Alee has a strong support system with a significant other of 13 years; and (2) sisters and a brother who are local and involved.  Haelyn reports sleeping "okay" other than being in pain with her arthritis.  Others report she is very "restless" at night and her arms are moving often.  She has had several  sleep studies and it has been determined she does not have sleep apnea.  Mckaylie reports a history of depression and anxiety and has been on medications for this for quite some time.  She reports these are helping and her mood is stable at this time; other than sadness over the recent loss of her 55 year old dog.  Kymora has  goals to breathe and feel better over all; to lose weight and to generally have a better quality of life.  Staff will follow with Peta throughout the course of this program.      Expected Outcomes  Harlene will benefit from consistent exercise to achieve her stated goals.  She will meet with the dietician to address her weight loss goal.  Vashon will also benefit from the educational and psychoeducational components of this program to learn more about her condition and discover more positive ways of coping in general.      Continue Psychosocial Services   Follow up required by staff       Psychosocial Re-Evaluation: Psychosocial Re-Evaluation    [place] Name 07/14/17 1158 07/25/17 1154 09/10/17 1232 11/05/17 1227       Psychosocial Re-Evaluation   Current issues with  Current Stress Concerns  Current Stress Concerns  Current Stress Concerns  Current Stress Concerns    Comments  Dana Hill is still stressed from losing her dog and other family issues - nephew and sister.  Dana Hill is starting to heal from losing her dog. She is enjoying coming to [facility] as a way to step away from work as well.   Jeweline reports continual healing from the loss of her dog. She is staying busy with working at home, going out with her three sons and coming to [employer]. She reports work is getting better and is no longer a stressor  Dana Hill got a new car that has payments and things are going well with that. Her old car died and she was upset with that. Smoking is worrying her alot and knows it is not easy to quit. She wants to be strong and quit smoking. She states she should work in the office and that would help her quit  more. Her boyfriend smokes and she has talked to him about quitting together.    Expected Outcomes  Short - Brianne is looking for another dog Long - Toy will continue to exercise to get out of the house and deal with stress  Short: Lari will continue to come to [place]; Long: Ruchi will find other activities besides LW to get out of the house.   Short: Viveca will graduate from [employer]: Tama will enroll in [employer] or another exercise program to stay active and get out of the house  Short: decrease smoking intake with her boyfriend. Long: Quit smoking to decrease stress.    Interventions  Encouraged to attend Pulmonary Rehabilitation for the exercise  Encouraged to attend Pulmonary Rehabilitation for the exercise  -  Encouraged to attend Pulmonary Rehabilitation for the exercise    Continue Psychosocial Services   Follow up required by staff  -  -  Follow up required by staff       Psychosocial Discharge (Final Psychosocial Re-Evaluation): Psychosocial Re-Evaluation - 11/05/17 1227      Psychosocial Re-Evaluation   Current issues with  Current Stress Concerns    Comments  Dana Hill got a new car that has payments and things are going well with that. Her old car died and she was upset with that. Smoking is worrying her alot and knows it is not easy to quit. She wants to be strong and quit smoking. She states she should work in the office and that would help her quit more. Her boyfriend smokes and she has talked to him about quitting together.    Expected Outcomes  Short: decrease smoking intake with her boyfriend. Long:  Quit smoking to decrease stress.    Interventions  Encouraged to attend Pulmonary Rehabilitation for the exercise    Continue Psychosocial Services   Follow up required by staff       Education: Education Goals: Education classes will be provided on a weekly basis, covering required topics. Participant will state understanding/return demonstration of topics  presented.  Learning Barriers/Preferences: Learning Barriers/Preferences - 06/17/17 1523      Learning Barriers/Preferences   Learning Barriers  None    Learning Preferences  None       Education Topics:  Initial Evaluation Education: - Verbal, written and demonstration of respiratory meds, oximetry and breathing techniques. Instruction on use of nebulizers and MDIs and importance of monitoring MDI activations.   Pulmonary Rehab from 11/05/2017 in [facility] Cardiac and Pulmonary Rehab  Date  06/17/17  Educator  [facility]  Instruction Review Code  1- Verbalizes Understanding      General Nutrition Guidelines/Fats and Fiber: -Group instruction provided by verbal, written material, models and posters to present the general guidelines for heart healthy nutrition. Gives an explanation and review of dietary fats and fiber.   Pulmonary Rehab from 11/05/2017 in [facility] Cardiac and Pulmonary Rehab  Date  08/04/17  Educator  CR  Instruction Review Code  1- Verbalizes Understanding      Controlling Sodium/Reading Food Labels: -Group verbal and written material supporting the discussion of sodium use in heart healthy nutrition. Review and explanation with models, verbal and written materials for utilization of the food label.   Pulmonary Rehab from 11/05/2017 in [facility] Cardiac and Pulmonary Rehab  Date  08/11/17  Educator  CR  Instruction Review Code  1- Verbalizes Understanding      Exercise Physiology & General Exercise Guidelines: - Group verbal and written instruction with models to review the exercise physiology of the cardiovascular system and associated critical values. Provides general exercise guidelines with specific guidelines to those with heart or lung disease.    Pulmonary Rehab from 11/05/2017 in [facility] Cardiac and Pulmonary Rehab  Date  10/31/17  Educator  [facility]  Instruction Review Code  1- Verbalizes Understanding      Aerobic Exercise & Resistance Training: - Gives group verbal and  written instruction on the various components of exercise. Focuses on aerobic and resistive training programs and the benefits of this training and how to safely progress through these programs.   Flexibility, Balance, Mind/Body Relaxation: Provides group verbal/written instruction on the benefits of flexibility and balance training, including mind/body exercise modes such as yoga, pilates and tai chi.  Demonstration and skill practice provided.   Pulmonary Rehab from 11/05/2017 in [facility] Cardiac and Pulmonary Rehab  Date  06/25/17  Educator  [facility]  Instruction Review Code  1- Verbalizes Understanding      Stress and Anxiety: - Provides group verbal and written instruction about the health risks of elevated stress and causes of high stress.  Discuss the correlation between heart/lung disease and anxiety and treatment options. Review healthy ways to manage with stress and anxiety.   Pulmonary Rehab from 11/05/2017 in [facility] Cardiac and Pulmonary Rehab  Date  10/08/17  Educator  [facility]  Instruction Review Code  1- Verbalizes Understanding      Depression: - Provides group verbal and written instruction on the correlation between heart/lung disease and depressed mood, treatment options, and the stigmas associated with seeking treatment.   Exercise & Equipment Safety: - Individual verbal instruction and demonstration of equipment use and safety with use of the equipment.  Pulmonary Rehab from 11/05/2017 in [facility] Cardiac and Pulmonary Rehab  Date  06/17/17  Educator  [facility]  Instruction Review Code  1- Verbalizes Understanding      Infection Prevention: - Provides verbal and written material to individual with discussion of infection control including proper hand washing and proper equipment cleaning during exercise session.   Pulmonary Rehab from 11/05/2017 in [facility] Cardiac and Pulmonary Rehab  Date  06/17/17  Educator  [facility]  Instruction Review Code  1- Verbalizes Understanding      Falls  Prevention: - Provides verbal and written material to individual with discussion of falls prevention and safety.   Pulmonary Rehab from 11/05/2017 in [facility] Cardiac and Pulmonary Rehab  Date  06/17/17  Educator  [facility]  Instruction Review Code  1- Verbalizes Understanding      Diabetes: - Individual verbal and written instruction to review signs/symptoms of diabetes, desired ranges of glucose level fasting, after meals and with exercise. Advice that pre and post exercise glucose checks will be done for 3 sessions at entry of program.   Chronic Lung Diseases: - Group verbal and written instruction to review updates, respiratory medications, advancements in procedures and treatments. Discuss use of supplemental oxygen including available portable oxygen systems, continuous and intermittent flow rates, concentrators, personal use and safety guidelines. Review proper use of inhaler and spacers. Provide informative websites for self-education.    Energy Conservation: - Provide group verbal and written instruction for methods to conserve energy, plan and organize activities. Instruct on pacing techniques, use of adaptive equipment and posture/positioning to relieve shortness of breath.   Triggers and Exacerbations: - Group verbal and written instruction to review types of environmental triggers and ways to prevent exacerbations. Discuss weather changes, air quality and the benefits of nasal washing. Review warning signs and symptoms to help prevent infections. Discuss techniques for effective airway clearance, coughing, and vibrations.   AED/CPR: - Group verbal and written instruction with the use of models to demonstrate the basic use of the AED with the basic ABC's of resuscitation.   Pulmonary Rehab from 11/05/2017 in [facility] Cardiac and Pulmonary Rehab  Date  07/11/17  Educator  [facility]  Instruction Review Code  1- Actuary and Physiology of the Lungs: - Group verbal and  written instruction with the use of models to provide basic lung anatomy and physiology related to function, structure and complications of lung disease.   Pulmonary Rehab from 11/05/2017 in [facility] Cardiac and Pulmonary Rehab  Date  07/23/17  Educator  [facility]  Instruction Review Code  1- Verbalizes Understanding      Anatomy & Physiology of the Heart: - Group verbal and written instruction and models provide basic cardiac anatomy and physiology, with the coronary electrical and arterial systems. Review of Valvular disease and Heart Failure   Pulmonary Rehab from 11/05/2017 in [facility] Cardiac and Pulmonary Rehab  Date  09/10/17  Educator  [facility]  Instruction Review Code  1- Verbalizes Understanding      Cardiac Medications: - Group verbal and written instruction to review commonly prescribed medications for heart disease. Reviews the medication, class of the drug, and side effects.   Pulmonary Rehab from 11/05/2017 in [facility] Cardiac and Pulmonary Rehab  Date  06/27/17  Educator  [facility]  Instruction Review Code  1- Verbalizes Understanding      Know Your Numbers and Risk Factors: -Group verbal and written instruction about important numbers in your health.  Discussion of what are risk  factors and how they play a role in the disease process.  Review of Cholesterol, Blood Pressure, Diabetes, and BMI and the role they play in your overall health.   Pulmonary Rehab from 11/05/2017 in [facility] Cardiac and Pulmonary Rehab  Date  08/22/17  Educator  [facility]  Instruction Review Code  1- Verbalizes Understanding      Sleep Hygiene: -Provides group verbal and written instruction about how sleep can affect your health.  Define sleep hygiene, discuss sleep cycles and impact of sleep habits. Review good sleep hygiene tips.    Pulmonary Rehab from 11/05/2017 in [facility] Cardiac and Pulmonary Rehab  Date  11/05/17  Educator  [facility]  Instruction Review Code  1- Verbalizes Understanding      Other: -Provides group and verbal  instruction on various topics (see comments)    Knowledge Questionnaire Score: Knowledge Questionnaire Score - 06/17/17 1523      Knowledge Questionnaire Score   Pre Score  15/18 reviewed with patient        Core Components/Risk Factors/Patient Goals at Admission: Personal Goals and Risk Factors at Admission - 06/17/17 1526      Core Components/Risk Factors/Patient Goals on Admission    Weight Management  Yes;Obesity;Weight Loss    Intervention  Weight Management: Develop a combined nutrition and exercise program designed to reach desired caloric intake, while maintaining appropriate intake of nutrient and fiber, sodium and fats, and appropriate energy expenditure required for the weight goal.;Weight Management: Provide education and appropriate resources to help participant work on and attain dietary goals.;Weight Management/Obesity: Establish reasonable short term and long term weight goals.;Obesity: Provide education and appropriate resources to help participant work on and attain dietary goals.    Admit Weight  263 lb 3.2 oz (119.4 kg)    Goal Weight: Short Term  258 lb (117 kg)    Goal Weight: Long Term  160 lb (72.6 kg)    Expected Outcomes  Short Term: Continue to assess and modify interventions until short term weight is achieved;Long Term: Adherence to nutrition and physical activity/exercise program aimed toward attainment of established weight goal;Weight Maintenance: Understanding of the daily nutrition guidelines, which includes 25-35% calories from fat, 7% or less cal from saturated fats, less than 252m cholesterol, less than 1.5gm of sodium, & 5 or more servings of fruits and vegetables daily;Weight Loss: Understanding of general recommendations for a balanced deficit meal plan, which promotes 1-2 lb weight loss per week and includes a negative energy balance of [phone] kcal/d;Understanding recommendations for meals to include 15-35% energy as protein, 25-35% energy from fat,  35-60% energy from carbohydrates, less than 2093mof dietary cholesterol, 20-35 gm of total fiber daily;Understanding of distribution of calorie intake throughout the day with the consumption of 4-5 meals/snacks    Tobacco Cessation  Yes    Number of packs per day  4 cigarettes a day    Intervention  Assist the participant in steps to quit. Provide individualized education and counseling about committing to Tobacco Cessation, relapse prevention, and pharmacological support that can be provided by physician.;OfAdvice workerassist with locating and accessing local/national Quit Smoking programs, and support quit date choice.    Expected Outcomes  Short Term: Will demonstrate readiness to quit, by selecting a quit date.;Long Term: Complete abstinence from all tobacco products for at least 12 months from quit date.;Short Term: Will quit all tobacco product use, adhering to prevention of relapse plan.    Improve shortness of breath with ADL's  Yes  Intervention  Provide education, individualized exercise plan and daily activity instruction to help decrease symptoms of SOB with activities of daily living.    Expected Outcomes  Short Term: Improve cardiorespiratory fitness to achieve a reduction of symptoms when performing ADLs;Long Term: Be able to perform more ADLs without symptoms or delay the onset of symptoms    Hypertension  Yes    Intervention  Provide education on lifestyle modifcations including regular physical activity/exercise, weight management, moderate sodium restriction and increased consumption of fresh fruit, vegetables, and low fat dairy, alcohol moderation, and smoking cessation.;Monitor prescription use compliance.    Expected Outcomes  Short Term: Continued assessment and intervention until BP is < 140/2m HG in hypertensive participants. < 130/864mHG in hypertensive participants with diabetes, heart failure or chronic kidney disease.;Long Term: Maintenance of blood  pressure at goal levels.       Core Components/Risk Factors/Patient Goals Review:  Goals and Risk Factor Review    Row Name 07/14/17 1152 07/14/17 1247 07/25/17 1149 09/10/17 1221 10/08/17 1140     Core Components/Risk Factors/Patient Goals Review   Personal Goals Review  Tobacco Cessation;Improve shortness of breath with ADL's;Weight Management/Obesity  Tobacco Cessation  Weight Management/Obesity;Improve shortness of breath with ADL's;Tobacco Cessation  Tobacco Cessation;Weight Management/Obesity;Hypertension;Improve shortness of breath with ADL's  -   Review  RoKristiannatates she is changing her diet to eat healthy - reducing portion sizes, especially sweets.  They are eating at home more often.  She is down to 3 cigarettes per day - one after each meal.  She state she has more energy since beginning to exercise.  She is checking her O2 and it was better today.  She is now taking wellbutrin.  Also gave her a fake cigarette to use to help stop smoking.  She also plans to use the patches if needed.   RoRalphines down 2 lb with the motivation to keep losing weight. Her shortness of breath has decreased since starting LW and she has noticed an improvement in her stamina. She is up to 5 cigs yesterday, but felt guilty about it and getting back to three a day and then quitting. She is very motivated to quit.   RoKrislyns down to 162 (admit [phone] She is really wanting to lose more, but has had some set backs recently with a growth having to be cut off of her leg and issues with the stitches. She is wanting to work hard these next few weeks in the program to lose weight and work on her SOB. She can tell a huge difference in her breathing, especially after how it became worse with her being out of [facility]. She is looking forward to it improving again. With her extra time at home, she picked back up smoking 5-6 cigs a day, but is wanting to try the patch.   RoAnahyeturned after a month long absence due to her leg  procedure. Hopefully plans to attend regularly.    Expected Outcomes  Short - RoChantalleill schedule and meet with RD and continue to eat healthy  Long - RoMemoriill reach first weight goal and continue to eat healthy  Short: Continue to work on quitting and use new fake cigarette.  Long: Continue to work towards cessation.   Short: continue on working on quitting smoking, come to [facility] work on SOB, stamina, and weight goals Long: continue to work on cessation and work on losing weight to get to her end goal of 160 lb.  Short: Illona will continue to lose weight and improve SOB by attending [facility] and decrease smoking in a timely manner. Long: Xylia will graduate [facility] and quit smoking.   -   Row Name 11/05/17 1215             Core Components/Risk Factors/Patient Goals Review   Personal Goals Review  Tobacco Cessation;Weight Management/Obesity;Hypertension;Improve shortness of breath with ADL's       Review  Sanaz has not decresed her smoking intake. She is trying to quit but cant find the time to go to a smoking cessation class. Her blood pressure has been doing better than when she started.       Expected Outcomes  Short: work on decreasing her smoking intake. Long: quit smoking.          Core Components/Risk Factors/Patient Goals at Discharge (Final Review):  Goals and Risk Factor Review - 11/05/17 1215      Core Components/Risk Factors/Patient Goals Review   Personal Goals Review  Tobacco Cessation;Weight Management/Obesity;Hypertension;Improve shortness of breath with ADL's    Review  Marylen has not decresed her smoking intake. She is trying to quit but cant find the time to go to a smoking cessation class. Her blood pressure has been doing better than when she started.    Expected Outcomes  Short: work on decreasing her smoking intake. Long: quit smoking.       ITP Comments: ITP Comments    Row Name 06/17/17 1428 06/23/17 0935 07/21/17 0837 08/18/17 0900 09/15/17 0848   ITP  Comments  Medical Evaluation completed. Chart sent for review and changes to [name] Director of [facility]. Diagnosis can be found in [facility] encounter 05/30/17  30 day review completed. ITP sent to [name] Director of [place]. Continue with ITP unless changes are made by physician.  30 day review completed. ITP sent to [name] Director of [place]. Continue with ITP unless changes are made by physician.   30 day review completed. ITP sent to [name] Director of [place]. Continue with ITP unless changes are made by physician   30 day review completed. ITP sent to [name] Director of [place]. Continue with ITP unless changes are made by physician   Row Name 09/30/17 1456 10/01/17 1439 10/08/17 1138 10/10/17 1114 10/13/17 0837   ITP Comments  Called to check on status of return.  She had another place removed on her shin on 5/9. She had a wound check today and was cleared to return to rehab tomorrow.    Louanne called to let us know that her wound started to bleed again today.  She was to go see the doctor today and will return next week.   Maverick returned after a month long absence due to her leg procedure. Hopefully plans to attend regularly.   Harry called to let us know that she will be out until next Wednesday. She got a surprise beach trip.    30 day review completed. ITP sent to [name] Director of [place]. Continue with ITP unless changes are made by physician   [place] Name 11/10/17 1524           ITP Comments   30 day review completed. ITP sent to [name] Director of [place]. Continue with ITP unless changes are made by physician          Comments: 30 day review

## 2017-11-14 ENCOUNTER — Telehealth: Payer: Self-pay | Admitting: *Deleted

## 2017-11-14 NOTE — Telephone Encounter (Signed)
Zella BallRobin called to let us know that she is out of town and hopes to return next week.

## 2017-11-17 ENCOUNTER — Encounter: Payer: BLUE CROSS/BLUE SHIELD | Attending: Internal Medicine

## 2017-11-17 DIAGNOSIS — J439 Emphysema, unspecified: Secondary | ICD-10-CM | POA: Diagnosis present

## 2017-11-17 NOTE — Progress Notes (Signed)
Daily Session Note  [name] MRN: [id number] Date of Birth: May 10, 1961 Referring Provider:     Pulmonary Rehab from 06/17/2017 in [facility] Cardiac and Pulmonary Rehab  Referring Provider  Tereasa Coop MD      Encounter Date: 11/17/2017  Check In: Session Check In - 11/17/17 1637      Check-In   Location  [facility]-Cardiac & Pulmonary Rehab    Staff Present  [name],[name],[name];Heath Lark, RN, BSN, CCRP;Laureen Owens Shark, BS, [name], Respiratory Therapist;Kelly Amedeo Plenty, BS, [name], [name]    Supervising physician immediately available to respond to emergencies  [facility] immediately available ER MD    Physician(s)  Dr. Cinda Quest and Corky Downs    Medication changes reported      No    Fall or balance concerns reported     No    Tobacco Cessation  No Change    Warm-up and Cool-down  Performed as group-led instruction    Resistance Training Performed  Yes    VAD Patient?  No      Pain Assessment   Currently in Pain?  No/denies          Social History   Tobacco Use  Smoking Status Current Every Day Smoker  . Packs/day: 0.25  . Years: 25.00  . Pack years: 6.25  . Types: Cigarettes  Smokeless Tobacco Never Used  Tobacco Comment   given fake cigarette today, she is on wellbutrin and has patches on standby.    Goals Met:  Independence with exercise equipment Exercise tolerated well No report of cardiac concerns or symptoms Strength training completed today  Goals Unmet:  Not Applicable  Comments: Pt able to follow exercise prescription today without complaint.  Will continue to monitor for progression.   [name] is Medical Director for [facility] [place] and [facility].

## 2017-11-24 ENCOUNTER — Telehealth: Payer: Self-pay

## 2017-11-24 NOTE — Telephone Encounter (Signed)
Dana BallRobin called to say she has been out of class due to her sauriasis. She is going to try to come back on Wednesday.

## 2017-11-26 ENCOUNTER — Encounter: Payer: BLUE CROSS/BLUE SHIELD | Admitting: *Deleted

## 2017-11-26 DIAGNOSIS — J439 Emphysema, unspecified: Secondary | ICD-10-CM | POA: Diagnosis not present

## 2017-11-26 NOTE — Progress Notes (Signed)
Daily Session Note  [name] MRN: [id number] Date of Birth: [birth year] Referring Provider:     Pulmonary Rehab from 06/17/2017 in [facility] Cardiac and Pulmonary Rehab  Referring Provider  Tereasa Coop MD      Encounter Date: 11/26/2017  Check In: Session Check In - 11/26/17 1137      Check-In   Location  [facility]-Cardiac & Pulmonary Rehab    Staff Present  Justin Mend Lorre Nick, [place], RCEP, CCRP, [name];Meredith Sherryll Burger, RN BSN    Supervising physician immediately available to respond to emergencies  [facility] immediately available ER MD    Physician(s)  Dr. Jacqualine Code and Jimmye Norman     Medication changes reported      No    Fall or balance concerns reported     No    Tobacco Cessation  No Change    Warm-up and Cool-down  Performed as group-led instruction    Resistance Training Performed  Yes    VAD Patient?  No      Pain Assessment   Currently in Pain?  No/denies        Exercise Prescription Changes - 11/25/17 1400      Response to Exercise   Blood Pressure (Admit)  130/84    Blood Pressure (Exit)  110/62    Heart Rate (Admit)  81 bpm    Heart Rate (Exercise)  94 bpm    Heart Rate (Exit)  71 bpm    Oxygen Saturation (Admit)  94 %    Oxygen Saturation (Exercise)  91 %    Oxygen Saturation (Exit)  95 %    Rating of Perceived Exertion (Exercise)  13    Perceived Dyspnea (Exercise)  1    Symptoms  none    Duration  Continue with 45 min of aerobic exercise without signs/symptoms of physical distress.    Intensity  THRR unchanged      Progression   Progression  Continue to progress workloads to maintain intensity without signs/symptoms of physical distress.    Average METs  2.23      Resistance Training   Training Prescription  Yes    Weight  4 lbs    Reps  10-15      Interval Training   Interval Training  No      Oxygen   Oxygen  Continuous    Liters  2      Treadmill   MPH  1.8    Grade  0.5    Minutes  15     METs  2.5      Recumbant Elliptical   Level  4    RPM  40    Minutes  15    METs  2.2      T5 Nustep   Level  4    SPM  86    Minutes  15    METs  2.2      Home Exercise Plan   Plans to continue exercise at  Home (comment) gym in complex, treadmill, stairmill, walking    Frequency  Add 2 additional days to program exercise sessions.    Initial Home Exercises Provided  07/18/17       Social History   Tobacco Use  Smoking Status Current Every Day Smoker  . Packs/day: 0.25  . Years: 25.00  . Pack years: 6.25  . Types: Cigarettes  Smokeless Tobacco Never Used  Tobacco Comment   given fake cigarette today, she is on  wellbutrin and has patches on standby.    Goals Met:  Proper associated with RPD/PD & O2 Sat Independence with exercise equipment Using PLB without cueing & demonstrates good technique Exercise tolerated well No report of cardiac concerns or symptoms Strength training completed today  Goals Unmet:  Not Applicable  Comments: Pt able to follow exercise prescription today without complaint.  Will continue to monitor for progression.    [name] is Medical Director for [place] and [facility].

## 2017-11-28 ENCOUNTER — Encounter: Payer: BLUE CROSS/BLUE SHIELD | Admitting: *Deleted

## 2017-11-28 DIAGNOSIS — J439 Emphysema, unspecified: Secondary | ICD-10-CM

## 2017-11-28 NOTE — Progress Notes (Signed)
Daily Session Note  [name] MRN: [id number] Date of Birth: [birth year] Referring Provider:     Pulmonary Rehab from 06/17/2017 in [facility] Cardiac and Pulmonary Rehab  Referring Provider  Tereasa Coop MD      Encounter Date: 11/28/2017  Check In: Session Check In - 11/28/17 1134      Check-In   Location  [facility]-Cardiac & Pulmonary Rehab    Staff Present  Alberteen Sam, MA, RCEP, CCRP, [name];Alaura Schippers Sherryll Burger, RN BSN;Joseph Flavia Shipper    Supervising physician immediately available to respond to emergencies  [facility] immediately available ER MD    Physician(s)  Dr. Mariea Clonts and [name]    Medication changes reported      No    Fall or balance concerns reported     No    Tobacco Cessation  No Change    Warm-up and Cool-down  Performed as group-led instruction    Resistance Training Performed  Yes    VAD Patient?  No      Pain Assessment   Currently in Pain?  No/denies          Social History   Tobacco Use  Smoking Status Current Every Day Smoker  . Packs/day: 0.25  . Years: 25.00  . Pack years: 6.25  . Types: Cigarettes  Smokeless Tobacco Never Used  Tobacco Comment   given fake cigarette today, she is on wellbutrin and has patches on standby.    Goals Met:  Proper associated with RPD/PD & O2 Sat Independence with exercise equipment Using PLB without cueing & demonstrates good technique Exercise tolerated well Personal goals reviewed No report of cardiac concerns or symptoms Strength training completed today  Goals Unmet:  Not Applicable  Comments: Pt able to follow exercise prescription today without complaint.  Will continue to monitor for progression. See ITP for goal review   [name] is Medical Director for [place] and [facility].

## 2017-12-01 DIAGNOSIS — J439 Emphysema, unspecified: Secondary | ICD-10-CM | POA: Diagnosis not present

## 2017-12-01 NOTE — Progress Notes (Signed)
Daily Session Note  [name] MRN: [id number] Date of Birth: 06-27-1960 Referring Provider:     Pulmonary Rehab from 06/17/2017 in [facility] Cardiac and Pulmonary Rehab  Referring Provider  Tereasa Coop MD      Encounter Date: 12/01/2017  Check In: Session Check In - 12/01/17 1136      Check-In   Location  [facility]-Cardiac & Pulmonary Rehab    Staff Present  Justin Mend Jaci Carrel, BS, [name], [name];Janani Chamber Oletta Darter, [place], [name], [name]    Supervising physician immediately available to respond to emergencies  [facility] immediately available ER MD    Physician(s)  Cinda Quest and Archie Balboa    Medication changes reported      No    Fall or balance concerns reported     No    Warm-up and Cool-down  Performed as group-led instruction    Resistance Training Performed  Yes    VAD Patient?  No    PAD/SET Patient?  No      Pain Assessment   Currently in Pain?  No/denies    Multiple Pain Sites  No          Social History   Tobacco Use  Smoking Status Current Every Day Smoker  . Packs/day: 0.25  . Years: 25.00  . Pack years: 6.25  . Types: Cigarettes  Smokeless Tobacco Never Used  Tobacco Comment   given fake cigarette today, she is on wellbutrin and has patches on standby.    Goals Met:  Proper associated with RPD/PD & O2 Sat Independence with exercise equipment Exercise tolerated well Strength training completed today  Goals Unmet:  Not Applicable  Comments: Pt able to follow exercise prescription today without complaint.  Will continue to monitor for progression.    [name] is Medical Director for [place] and [facility].

## 2017-12-03 ENCOUNTER — Encounter: Payer: BLUE CROSS/BLUE SHIELD | Admitting: *Deleted

## 2017-12-03 DIAGNOSIS — J439 Emphysema, unspecified: Secondary | ICD-10-CM | POA: Diagnosis not present

## 2017-12-03 NOTE — Progress Notes (Signed)
Daily Session Note  [name] MRN: [id number] Date of Birth: [birth year] Referring Provider:     Pulmonary Rehab from 06/17/2017 in [facility] Cardiac and Pulmonary Rehab  Referring Provider  Tereasa Coop MD      Encounter Date: 12/03/2017  Check In: Session Check In - 12/03/17 1157      Check-In   Location  [facility]-Cardiac & Pulmonary Rehab    Staff Present  Justin Mend Lorre Nick, [place], RCEP, CCRP, [name];Terrez Ander Sherryll Burger, RN BSN    Supervising physician immediately available to respond to emergencies  [facility] immediately available ER MD    Physician(s)  Dr. [name] and [facility]    Medication changes reported      No    Fall or balance concerns reported     No    Tobacco Cessation  No Change    Warm-up and Cool-down  Performed as group-led instruction    Resistance Training Performed  Yes    VAD Patient?  No      Pain Assessment   Currently in Pain?  No/denies          Social History   Tobacco Use  Smoking Status Current Every Day Smoker  . Packs/day: 0.25  . Years: 25.00  . Pack years: 6.25  . Types: Cigarettes  Smokeless Tobacco Never Used  Tobacco Comment   given fake cigarette today, she is on wellbutrin and has patches on standby.    Goals Met:  Proper associated with RPD/PD & O2 Sat Independence with exercise equipment Using PLB without cueing & demonstrates good technique Exercise tolerated well No report of cardiac concerns or symptoms Strength training completed today  Goals Unmet:  Not Applicable  Comments: Pt able to follow exercise prescription today without complaint.  Will continue to monitor for progression.    [name] is Medical Director for [place] and [facility].

## 2017-12-08 ENCOUNTER — Telehealth: Payer: Self-pay

## 2017-12-08 DIAGNOSIS — J439 Emphysema, unspecified: Secondary | ICD-10-CM

## 2017-12-08 NOTE — Progress Notes (Signed)
Pulmonary Individual Treatment Plan  [name] MRN: [id number] Date of Birth: January 10, 1961 Referring Provider:     Pulmonary Rehab from 06/17/2017 in [facility] Cardiac and Pulmonary Rehab  Referring Provider  Tereasa Coop MD      Initial Encounter Date:    Pulmonary Rehab from 06/17/2017 in [facility] Cardiac and Pulmonary Rehab  Date  06/17/17      Visit Diagnosis: Pulmonary emphysema, unspecified emphysema type ([place])  Patient's Home Medications on Admission:  Current Outpatient Medications:  .  albuterol (PROVENTIL HFA;VENTOLIN HFA) 108 (90 Base) MCG/ACT inhaler, Inhale 2 puffs into the lungs every 6 (six) hours as needed for wheezing or shortness of breath., Disp: , Rfl:  .  budesonide-formoterol (SYMBICORT) 160-4.5 MCG/ACT inhaler, Inhale 2 puffs into the lungs 2 (two) times daily., Disp: , Rfl:  .  buprenorphine-naloxone (SUBOXONE) 8-2 MG SUBL SL tablet, Place 1 tablet under the tongue 2 (two) times daily., Disp: , Rfl:  .  citalopram (CELEXA) 40 MG tablet, Take 40 mg by mouth daily., Disp: , Rfl:  .  estradiol (VIVELLE-DOT) 0.0375 MG/24HR, Place 1 patch onto the skin 2 (two) times a week., Disp: , Rfl:  .  fexofenadine (ALLEGRA) 180 MG tablet, Take 180 mg by mouth daily as needed for allergies or rhinitis., Disp: , Rfl:  .  hydrochlorothiazide (HYDRODIURIL) 25 MG tablet, Take 25 mg by mouth daily., Disp: , Rfl:  .  hydrOXYzine (ATARAX/VISTARIL) 25 MG tablet, Take 25 mg by mouth daily as needed for itching., Disp: , Rfl:  .  ibuprofen (ADVIL,MOTRIN) 800 MG tablet, Take 800 mg by mouth 2 (two) times daily as needed., Disp: , Rfl:  .  lamoTRIgine (LAMICTAL) 25 MG tablet, Take 25 mg by mouth 2 (two) times daily., Disp: , Rfl:  .  lisinopril (PRINIVIL,ZESTRIL) 40 MG tablet, Take 40 mg by mouth daily., Disp: , Rfl:  .  LORazepam (ATIVAN) 1 MG tablet, Take 1 mg by mouth 2 (two) times daily as needed for anxiety., Disp: , Rfl:  .  omeprazole (PRILOSEC) 20 MG capsule, Take 20  mg by mouth daily., Disp: , Rfl:  .  progesterone (PROMETRIUM) 100 MG capsule, Take 100 mg by mouth at bedtime., Disp: , Rfl:  .  Secukinumab (COSENTYX 300 DOSE) 150 MG/ML SOSY, Inject into the skin every 30 (thirty) days. Last dose 04/02/16, Disp: , Rfl:  .  spironolactone (ALDACTONE) 25 MG tablet, Take 25 mg by mouth daily., Disp: , Rfl:   Past Medical History: Past Medical History:  Diagnosis Date  . Asthma   . COPD (chronic obstructive pulmonary disease) ([place])   . Degenerative disc disease, cervical   . Depression   . GERD (gastroesophageal reflux disease)   . Hypertension   . Osteoarthritis   . Psoriatic arthritis ([place])   . Wears contact lenses    right eye    Tobacco Use: Social History   Tobacco Use  Smoking Status Current Every Day Smoker  . Packs/day: 0.25  . Years: 25.00  . Pack years: 6.25  . Types: Cigarettes  Smokeless Tobacco Never Used  Tobacco Comment   given fake cigarette today, she is on wellbutrin and has patches on standby.    Labs: Recent Review Flowsheet Data    There is no flowsheet data to display.       Pulmonary Assessment Scores: Pulmonary Assessment Scores    Row Name 06/17/17 1521 12/01/17 1149       ADL UCSD   ADL Phase  Entry  Exit    SOB Score total  44  27    Rest  0  0    Walk  2  1    Stairs  4  3    Bath  2  1    Dress  1  1    Shop  3  2      CAT Score   CAT Score  21  11      mMRC Score   mMRC Score  3  -       Pulmonary Function Assessment: Pulmonary Function Assessment - 06/17/17 1522      Breath   Bilateral Breath Sounds  Wheezes    Shortness of Breath  Yes;Fear of Shortness of Breath;Limiting activity       Exercise Target Goals:    Exercise Program Goal: Individual exercise prescription set using results from initial 6 min walk test and THRR while considering  patient's activity barriers and safety.    Exercise Prescription Goal: Initial exercise prescription builds to 30-45 minutes a day of  aerobic activity, 2-3 days per week.  Home exercise guidelines will be given to patient during program as part of exercise prescription that the participant will acknowledge.  Activity Barriers & Risk Stratification: Activity Barriers & Cardiac Risk Stratification - 06/17/17 1623      Activity Barriers & Cardiac Risk Stratification   Activity Barriers  Arthritis;Joint Problems;History of Falls;Muscular Weakness;Deconditioning;Shortness of Breath soriatic arthritis, knee pain       6 Minute Walk: 6 Minute Walk    Row Name 06/17/17 1620         6 Minute Walk   Phase  Initial     Distance  894 feet     Walk Time  6 minutes     # of Rest Breaks  0     MPH  1.69     METS  2.17     RPE  13     Perceived Dyspnea   2     VO2 Peak  7.6     Symptoms  Yes (comment)     Comments  SOB, hip pain 5-6/10, back pain 3/10     Resting HR  69 bpm     Resting BP  136/74     Resting Oxygen Saturation   93 % 88% on Room Air     Exercise Oxygen Saturation  during 6 min walk  88 %     Max Ex. HR  127 bpm     Max Ex. BP  146/84     2 Minute Post BP  134/76       Interval HR   1 Minute HR  86     2 Minute HR  111     3 Minute HR  107     4 Minute HR  127     5 Minute HR  108     6 Minute HR  112     2 Minute Post HR  75     Interval Heart Rate?  Yes       Interval Oxygen   Interval Oxygen?  Yes     Baseline Oxygen Saturation %  93 %     1 Minute Oxygen Saturation %  89 %     1 Minute Liters of Oxygen  3 L     2 Minute Oxygen Saturation %  88 %     2 Minute Liters of  Oxygen  3 L     3 Minute Oxygen Saturation %  89 %     3 Minute Liters of Oxygen  3 L     4 Minute Oxygen Saturation %  88 %     4 Minute Liters of Oxygen  3 L     5 Minute Oxygen Saturation %  89 %     5 Minute Liters of Oxygen  3 L     6 Minute Oxygen Saturation %  88 %     6 Minute Liters of Oxygen  3 L     2 Minute Post Oxygen Saturation %  96 %     2 Minute Post Liters of Oxygen  3 L       Oxygen Initial  Assessment: Oxygen Initial Assessment - 06/17/17 1523      Home Oxygen   Home Oxygen Device  E-Tanks she is just now getting home oxygen and needs to be evaluated    Sleep Oxygen Prescription  None    Home Exercise Oxygen Prescription  None    Home at Rest Exercise Oxygen Prescription  None      Initial 6 min Walk   Oxygen Used  Continuous;E-Tanks    Liters per minute  3      Program Oxygen Prescription   Program Oxygen Prescription  Continuous;E-Tanks    Liters per minute  3    Comments  patient is getting home oxygen and is suppose to use 3 liters.       Intervention   Short Term Goals  To learn and exhibit compliance with exercise, home and travel O2 prescription;To learn and understand importance of maintaining oxygen saturations>88%;To learn and demonstrate proper use of respiratory medications;To learn and demonstrate proper pursed lip breathing techniques or other breathing techniques.;To learn and understand importance of monitoring SPO2 with pulse oximeter and demonstrate accurate use of the pulse oximeter. inhaler at home. Albuterol prn    Long  Term Goals  Exhibits compliance with exercise, home and travel O2 prescription;Verbalizes importance of monitoring SPO2 with pulse oximeter and return demonstration;Maintenance of O2 saturations>88%;Exhibits proper breathing techniques, such as pursed lip breathing or other method taught during program session;Compliance with respiratory medication;Demonstrates proper use of MDI's       Oxygen Re-Evaluation: Oxygen Re-Evaluation    Row Name 06/25/17 1125 07/25/17 1158 09/10/17 1235 11/05/17 1212 11/28/17 1210     Program Oxygen Prescription   Program Oxygen Prescription  Continuous;E-Tanks  Continuous;E-Tanks  Continuous;E-Tanks  Continuous;E-Tanks  Continuous;E-Tanks   Liters per minute  _0 Comments  patient is getting home oxygen and is suppose to use 3 liters.   -  patient is getting home oxygen and is suppose to use  3 liters.   -  -     Home Oxygen   Home Oxygen Device  E-Tanks  E-Tanks  E-Tanks  E-Tanks  E-Tanks;Home Concentrator   Sleep Oxygen Prescription  -  None  None  Continuous  Continuous   Liters per minute  -  -  -  4  4   Home Exercise Oxygen Prescription  None  None  None  None  None   Home at Rest Exercise Oxygen Prescription  None  None  None  -  None   Compliance with Home Oxygen Use  Yes  Yes  Yes  No  No     Goals/Expected Outcomes   Short Term  Goals  To learn and exhibit compliance with exercise, home and travel O2 prescription;To learn and understand importance of maintaining oxygen saturations>88%;To learn and demonstrate proper use of respiratory medications;To learn and demonstrate proper pursed lip breathing techniques or other breathing techniques.;To learn and understand importance of monitoring SPO2 with pulse oximeter and demonstrate accurate use of the pulse oximeter.  To learn and exhibit compliance with exercise, home and travel O2 prescription;To learn and understand importance of maintaining oxygen saturations>88%;To learn and demonstrate proper use of respiratory medications;To learn and demonstrate proper pursed lip breathing techniques or other breathing techniques.;To learn and understand importance of monitoring SPO2 with pulse oximeter and demonstrate accurate use of the pulse oximeter.  To learn and exhibit compliance with exercise, home and travel O2 prescription;To learn and understand importance of maintaining oxygen saturations>88%;To learn and demonstrate proper use of respiratory medications;To learn and demonstrate proper pursed lip breathing techniques or other breathing techniques.;To learn and understand importance of monitoring SPO2 with pulse oximeter and demonstrate accurate use of the pulse oximeter.  To learn and exhibit compliance with exercise, home and travel O2 prescription;To learn and understand importance of maintaining oxygen saturations>88%;To learn  and demonstrate proper use of respiratory medications;To learn and demonstrate proper pursed lip breathing techniques or other breathing techniques.;To learn and understand importance of monitoring SPO2 with pulse oximeter and demonstrate accurate use of the pulse oximeter.  To learn and exhibit compliance with exercise, home and travel O2 prescription;To learn and understand importance of maintaining oxygen saturations>88%;To learn and demonstrate proper use of respiratory medications;To learn and demonstrate proper pursed lip breathing techniques or other breathing techniques.;To learn and understand importance of monitoring SPO2 with pulse oximeter and demonstrate accurate use of the pulse oximeter.   Long  Term Goals  Exhibits compliance with exercise, home and travel O2 prescription;Verbalizes importance of monitoring SPO2 with pulse oximeter and return demonstration;Maintenance of O2 saturations>88%;Exhibits proper breathing techniques, such as pursed lip breathing or other method taught during program session;Compliance with respiratory medication;Demonstrates proper use of MDI's  Exhibits compliance with exercise, home and travel O2 prescription;Verbalizes importance of monitoring SPO2 with pulse oximeter and return demonstration;Maintenance of O2 saturations>88%;Exhibits proper breathing techniques, such as pursed lip breathing or other method taught during program session;Compliance with respiratory medication;Demonstrates proper use of MDI's  Exhibits compliance with exercise, home and travel O2 prescription;Verbalizes importance of monitoring SPO2 with pulse oximeter and return demonstration;Maintenance of O2 saturations>88%;Exhibits proper breathing techniques, such as pursed lip breathing or other method taught during program session;Compliance with respiratory medication;Demonstrates proper use of MDI's  Exhibits compliance with exercise, home and travel O2 prescription;Verbalizes importance of  monitoring SPO2 with pulse oximeter and return demonstration;Maintenance of O2 saturations>88%;Exhibits proper breathing techniques, such as pursed lip breathing or other method taught during program session;Compliance with respiratory medication;Demonstrates proper use of MDI's  Exhibits compliance with exercise, home and travel O2 prescription;Verbalizes importance of monitoring SPO2 with pulse oximeter and return demonstration;Maintenance of O2 saturations>88%;Exhibits proper breathing techniques, such as pursed lip breathing or other method taught during program session;Compliance with respiratory medication;Demonstrates proper use of MDI's   Comments  Reviewed PLB technique with pt.  Talked about how it work and it's important to maintaining his exercise saturations.    Dana Hill is using her oxygen correctly and managing it well. She is also continuing to use PLB here and outside of [facility]. Her medications are being used properly.   Dana Hill is continuing to use her oxygen appropriately. She feels more confident in managing it  independently. She is practicing PLB when needed. She reports not having to use her rescue inhaler as much, and that her new daily inhaler is working well  Azuree has been imrving with her breathing since she started in the program. Steps are still a big issue with her shortness of breath. She has been working on PLB at home and has been doing well at it. She is not checking her oxygen at home that much she states, Informed her to check her oxygen at home if she is not going to wear any.  Dana Hill is going to start using her oxygen more at home and out and about.  She is going to call her company to see if she can get a smaller portable system.  She has been more compliant.  She is feeling better overall and using her PLB more.  She has started to use her pulse oximeter more.    Goals/Expected Outcomes  Short: Become more profiecient at using PLB.   Long: Become independent at using PLB.  Short:  continue to work on PLB. Long: continue to manage oxygen and respiratory medication.   Short: continue to work on PLB and use oxygen appropirately. Long: become indpenedent with PLB   Short: check oxygen at home more oftern. Long: maintain checking oxygen at home independently.  Short: Contact oxygen company for smaller portable tank.  Long Continue to check oxygen levels more.       Oxygen Discharge (Final Oxygen Re-Evaluation): Oxygen Re-Evaluation - 11/28/17 1210      Program Oxygen Prescription   Program Oxygen Prescription  Continuous;E-Tanks    Liters per minute  3      Home Oxygen   Home Oxygen Device  E-Tanks;Home Concentrator    Sleep Oxygen Prescription  Continuous    Liters per minute  4    Home Exercise Oxygen Prescription  None    Home at Rest Exercise Oxygen Prescription  None    Compliance with Home Oxygen Use  No      Goals/Expected Outcomes   Short Term Goals  To learn and exhibit compliance with exercise, home and travel O2 prescription;To learn and understand importance of maintaining oxygen saturations>88%;To learn and demonstrate proper use of respiratory medications;To learn and demonstrate proper pursed lip breathing techniques or other breathing techniques.;To learn and understand importance of monitoring SPO2 with pulse oximeter and demonstrate accurate use of the pulse oximeter.    Long  Term Goals  Exhibits compliance with exercise, home and travel O2 prescription;Verbalizes importance of monitoring SPO2 with pulse oximeter and return demonstration;Maintenance of O2 saturations>88%;Exhibits proper breathing techniques, such as pursed lip breathing or other method taught during program session;Compliance with respiratory medication;Demonstrates proper use of MDI's    Comments  Dana Hill is going to start using her oxygen more at home and out and about.  She is going to call her company to see if she can get a smaller portable system.  She has been more compliant.  She is  feeling better overall and using her PLB more.  She has started to use her pulse oximeter more.     Goals/Expected Outcomes  Short: Contact oxygen company for smaller portable tank.  Long Continue to check oxygen levels more.        Initial Exercise Prescription: Initial Exercise Prescription - 06/17/17 1600      Date of Initial Exercise RX and Referring Provider   Date  06/17/17    Referring Provider  Tereasa Coop MD  Oxygen   Oxygen  Continuous    Liters  3-4 4L on treadmill      Treadmill   MPH  1.5    Grade  0.5    Minutes  15    METs  2.25      Recumbant Elliptical   Level  1    RPM  50    Minutes  15    METs  2.1      T5 Nustep   Level  1    SPM  80    Minutes  15    METs  2.1      Prescription Details   Frequency (times per week)  3    Duration  Progress to 45 minutes of aerobic exercise without signs/symptoms of physical distress      Intensity   THRR 40-80% of Max Heartrate  106-143    Ratings of Perceived Exertion  11-13    Perceived Dyspnea  0-4      Progression   Progression  Continue to progress workloads to maintain intensity without signs/symptoms of physical distress.      Resistance Training   Training Prescription  Yes    Weight  3 lbs    Reps  10-15       Perform Capillary Blood Glucose checks as needed.  Exercise Prescription Changes: Exercise Prescription Changes    Row Name 06/17/17 1600 07/07/17 1600 07/18/17 1200 07/22/17 1400 08/05/17 1500     Response to Exercise   Blood Pressure (Admit)  136/74  130/70  -  124/76  124/80   Blood Pressure (Exercise)  146/84  -  -  -  -   Blood Pressure (Exit)  134/76  128/78  -  130/80  116/74   Heart Rate (Admit)  69 bpm  81 bpm  -  80 bpm  80 bpm   Heart Rate (Exercise)  127 bpm  96 bpm  -  89 bpm  85 bpm   Heart Rate (Exit)  75 bpm  73 bpm  -  72 bpm  71 bpm   Oxygen Saturation (Admit)  93 %  88 %  -  92 %  91 %   Oxygen Saturation (Exercise)  99 %  88 %  -  91 %  91 %   Oxygen  Saturation (Exit)  96 %  95 %  -  96 %  94 %   Rating of Perceived Exertion (Exercise)  13  13  -  13  13   Perceived Dyspnea (Exercise)  2  1  -  0  0   Symptoms  hip pain 6/10, back pain 3/10, SOB  none  -  none  none   Comments  walk test results  -  -  -  -   Duration  -  Continue with 45 min of aerobic exercise without signs/symptoms of physical distress.  -  Continue with 45 min of aerobic exercise without signs/symptoms of physical distress.  Continue with 45 min of aerobic exercise without signs/symptoms of physical distress.   Intensity  -  THRR unchanged  -  THRR unchanged  THRR unchanged     Progression   Progression  -  Continue to progress workloads to maintain intensity without signs/symptoms of physical distress.  -  Continue to progress workloads to maintain intensity without signs/symptoms of physical distress.  Continue to progress workloads to maintain intensity without signs/symptoms of physical distress.   Average METs  -  2.73  -  1.95  2.57     Resistance Training   Training Prescription  -  Yes  -  Yes  Yes   Weight  -  3 lbs  -  3 lbs  4 lbs   Reps  -  10-15  -  10-15  10-15     Interval Training   Interval Training  -  No  -  No  No     Oxygen   Oxygen  -  Continuous  -  Continuous  Continuous   Liters  -  2-3 3L on treadmill  -  3  2     Treadmill   MPH  -  1.5  -  1.5  2   Grade  -  0.5  -  0.5  1   Minutes  -  15  -  15  15   METs  -  2.25  -  2.25  2.81     Recumbant Elliptical   Level  -  1  -  2  2   RPM  -  39  -  33  50   Minutes  -  15  -  15  15   METs  -  1.3  -  1.6  2     T5 Nustep   Level  -  1  -  2  3   SPM  -  100  -  121  97   Minutes  -  15  -  15  15   METs  -  1.9  -  2  2.9     Home Exercise Plan   Plans to continue exercise at  -  -  Home (comment) gym in complex, treadmill, stairmill, walking  Home (comment) gym in complex, treadmill, stairmill, walking  Home (comment) gym in complex, treadmill, stairmill, walking    Frequency  -  -  Add 1 additional day to program exercise sessions.  Add 1 additional day to program exercise sessions.  Add 1 additional day to program exercise sessions.   Initial Home Exercises Provided  -  -  07/18/17  07/18/17  07/18/17   Row Name 08/18/17 1500 09/02/17 1400 09/16/17 1600 10/15/17 1500 10/29/17 1400     Response to Exercise   Blood Pressure (Admit)  130/74  132/82  132/72  124/68  130/80   Blood Pressure (Exit)  122/78  122/76  126/70  116/68  110/70   Heart Rate (Admit)  77 bpm  83 bpm  81 bpm  72 bpm  71 bpm   Heart Rate (Exercise)  89 bpm  97 bpm  97 bpm  94 bpm  92 bpm   Heart Rate (Exit)  93 bpm  73 bpm  86 bpm  71 bpm  72 bpm   Oxygen Saturation (Admit)  94 %  95 %  96 %  97 %  97 %   Oxygen Saturation (Exercise)  89 %  90 %  90 %  92 %  90 %   Oxygen Saturation (Exit)  95 %  96 %  93 %  97 %  97 %   Rating of Perceived Exertion (Exercise)  _0 130   Perceived Dyspnea (Exercise)  0  _1 0   Symptoms  none  none  none  none  none   Duration  Continue  with 45 min of aerobic exercise without signs/symptoms of physical distress.  Continue with 45 min of aerobic exercise without signs/symptoms of physical distress.  Continue with 45 min of aerobic exercise without signs/symptoms of physical distress.  Continue with 45 min of aerobic exercise without signs/symptoms of physical distress.  Continue with 45 min of aerobic exercise without signs/symptoms of physical distress.   Intensity  THRR unchanged  THRR unchanged  THRR unchanged  THRR unchanged  THRR unchanged     Progression   Progression  Continue to progress workloads to maintain intensity without signs/symptoms of physical distress.  Continue to progress workloads to maintain intensity without signs/symptoms of physical distress.  Continue to progress workloads to maintain intensity without signs/symptoms of physical distress.  Continue to progress workloads to maintain intensity without signs/symptoms  of physical distress.  Continue to progress workloads to maintain intensity without signs/symptoms of physical distress.   Average METs  2.23  2.46  2.27  2.35  2.27     Resistance Training   Training Prescription  Yes  Yes  Yes  Yes  Yes   Weight  4 lbs  4 lbs  4 lbs  4 lb  4 lbs   Reps  10-15  10-15  10-15  10-15  10-15     Interval Training   Interval Training  No  -  No  No  No     Oxygen   Oxygen  Continuous  Continuous  Continuous  Continuous  Continuous   Liters  _0 Treadmill   MPH  1.8  2  1.8  1.8  1.8   Grade  0.5  0.5  0.5  0.5  0.5   Minutes  _1 METs  2.5  2.67  2.5  2.5  2.5     Recumbant Elliptical   Level  _2 RPM  44  46  41  40  42   Minutes  _3 METs  2.1  2.6  2.2  2.2  2.3     T5 Nustep   Level  _4 -  4   SPM  101  91  91  -  -   Minutes  _5 -  15   METs  2.1  2.1  2.1  -  2     Home Exercise Plan   Plans to continue exercise at  Home (comment) gym in complex, treadmill, stairmill, walking  Home (comment) gym in complex, treadmill, stairmill, walking  Home (comment) gym in complex, treadmill, stairmill, walking  Home (comment) gym in complex, treadmill, stairmill, walking  Home (comment) gym in complex, treadmill, stairmill, walking   Frequency  Add 1 additional day to program exercise sessions.  Add 1 additional day to program exercise sessions.  Add 1 additional day to program exercise sessions.  Add 1 additional day to program exercise sessions.  Add 2 additional days to program exercise sessions.   Initial Home Exercises Provided  07/18/17  07/18/17  07/18/17  07/18/17  07/18/17   Row Name 11/11/17 1100 11/25/17 1400           Response to Exercise   Blood Pressure (Admit)  132/64  130/84  Blood Pressure (Exit)  126/64  110/62      Heart Rate (Admit)  83 bpm  81 bpm      Heart Rate (Exercise)  95 bpm  94 bpm      Heart Rate (Exit)  91 bpm  71 bpm      Oxygen  Saturation (Admit)  94 %  94 %      Oxygen Saturation (Exercise)  91 %  91 %      Oxygen Saturation (Exit)  97 %  95 %      Rating of Perceived Exertion (Exercise)  13  13      Perceived Dyspnea (Exercise)  2  1      Symptoms  none  none      Duration  Continue with 45 min of aerobic exercise without signs/symptoms of physical distress.  Continue with 45 min of aerobic exercise without signs/symptoms of physical distress.      Intensity  THRR unchanged  THRR unchanged        Progression   Progression  Continue to progress workloads to maintain intensity without signs/symptoms of physical distress.  Continue to progress workloads to maintain intensity without signs/symptoms of physical distress.      Average METs  2.23  2.23        Resistance Training   Training Prescription  Yes  Yes      Weight  4 lbs  4 lbs      Reps  10-15  10-15        Interval Training   Interval Training  No  No        Oxygen   Oxygen  Continuous  Continuous      Liters  2  2        Treadmill   MPH  1.8  1.8      Grade  0.5  0.5      Minutes  15  15      METs  2.5  2.5        Recumbant Elliptical   Level  4  4      RPM  41  40      Minutes  15  15      METs  2.2  2.2        T5 Nustep   Level  4  4      SPM  90  86      Minutes  15  15      METs  2.2  2.2        Home Exercise Plan   Plans to continue exercise at  Home (comment) gym in complex, treadmill, stairmill, walking  Home (comment) gym in complex, treadmill, stairmill, walking      Frequency  Add 2 additional days to program exercise sessions.  Add 2 additional days to program exercise sessions.      Initial Home Exercises Provided  07/18/17  07/18/17         Exercise Comments: Exercise Comments    Row Name 06/25/17 1124 12/03/17 1224         Exercise Comments  First full day of exercise!  Patient was oriented to gym and equipment including functions, settings, policies, and procedures.  Patient's individual exercise prescription  and treatment plan were reviewed.  All starting workloads were established based on the results of the 6 minute walk test done at initial orientation visit.  The plan for exercise progression was  also introduced and progression will be customized based on patient's performance and goals.  Attempted post 6MWT today.  Pt had a panic attack half way through test and satuarations dropped to 75%.  Test was terminated.  She was able to recover to 95% once seated and calmed.  We will try again on Friday using rolling walker for support for her back. Akua claimed that as soon as her back started to hurt, she started getting short of breath which induced the panic attack causing her to wheeze.           Exercise Goals and Review: Exercise Goals    Row Name 06/17/17 1628             Exercise Goals   Increase Physical Activity  Yes       Intervention  Provide advice, education, support and counseling about physical activity/exercise needs.;Develop an individualized exercise prescription for aerobic and resistive training based on initial evaluation findings, risk stratification, comorbidities and participant's personal goals.       Expected Outcomes  Long Term: Add in home exercise to make exercise part of routine and to increase amount of physical activity.;Short Term: Attend rehab on a regular basis to increase amount of physical activity.;Long Term: Exercising regularly at least 3-5 days a week.       Increase Strength and Stamina  Yes       Intervention  Provide advice, education, support and counseling about physical activity/exercise needs.;Develop an individualized exercise prescription for aerobic and resistive training based on initial evaluation findings, risk stratification, comorbidities and participant's personal goals.       Expected Outcomes  Short Term: Increase workloads from initial exercise prescription for resistance, speed, and METs.;Short Term: Perform resistance training exercises  routinely during rehab and add in resistance training at home;Long Term: Improve cardiorespiratory fitness, muscular endurance and strength as measured by increased METs and functional capacity (6MWT)       Able to understand and use rate of perceived exertion (RPE) scale  Yes       Intervention  Provide education and explanation on how to use RPE scale       Expected Outcomes  Short Term: Able to use RPE daily in rehab to express subjective intensity level;Long Term:  Able to use RPE to guide intensity level when exercising independently       Able to understand and use Dyspnea scale  Yes       Intervention  Provide education and explanation on how to use Dyspnea scale       Expected Outcomes  Short Term: Able to use Dyspnea scale daily in rehab to express subjective sense of shortness of breath during exertion;Long Term: Able to use Dyspnea scale to guide intensity level when exercising independently       Knowledge and understanding of Target Heart Rate Range (THRR)  Yes       Intervention  Provide education and explanation of THRR including how the numbers were predicted and where they are located for reference       Expected Outcomes  Short Term: Able to state/look up THRR;Short Term: Able to use daily as guideline for intensity in rehab;Long Term: Able to use THRR to govern intensity when exercising independently       Able to check pulse independently  Yes       Intervention  Provide education and demonstration on how to check pulse in carotid and radial arteries.;Review the importance of being able to  check your own pulse for safety during independent exercise       Expected Outcomes  Short Term: Able to explain why pulse checking is important during independent exercise;Long Term: Able to check pulse independently and accurately       Understanding of Exercise Prescription  Yes       Intervention  Provide education, explanation, and written materials on patient's individual exercise  prescription       Expected Outcomes  Short Term: Able to explain program exercise prescription;Long Term: Able to explain home exercise prescription to exercise independently          Exercise Goals Re-Evaluation : Exercise Goals Re-Evaluation    Row Name 06/25/17 1124 07/07/17 1621 07/18/17 1219 07/25/17 1156 08/05/17 1503     Exercise Goal Re-Evaluation   Exercise Goals Review  Understanding of Exercise Prescription;Able to understand and use Dyspnea scale;Knowledge and understanding of Target Heart Rate Range (THRR);Able to understand and use rate of perceived exertion (RPE) scale  Increase Physical Activity;Understanding of Exercise Prescription;Increase Strength and Stamina  Increase Physical Activity;Understanding of Exercise Prescription;Increase Strength and Stamina;Able to understand and use Dyspnea scale;Knowledge and understanding of Target Heart Rate Range (THRR);Able to understand and use rate of perceived exertion (RPE) scale;Able to check pulse independently  Increase Physical Activity;Understanding of Exercise Prescription;Increase Strength and Stamina;Able to understand and use Dyspnea scale;Knowledge and understanding of Target Heart Rate Range (THRR);Able to understand and use rate of perceived exertion (RPE) scale;Able to check pulse independently  Increase Physical Activity;Understanding of Exercise Prescription;Increase Strength and Stamina   Comments  Reviewed RPE scale, THR and program prescription with pt today.  Pt voiced understanding and was given a copy of goals to take home.   Dana Hill has been doing well in rehab.  She is now using 3L on the treadmill to help improve her saturations.  We will continue to monitor her progress.   Reviewed home exercise with pt today.  Pt plans to walk and go to gym in complex for exercise.  She will have access to a treadmill and stairmill. Reviewed THR, pulse, RPE, sign and symptoms, and when to call 911 or MD.  Also discussed weather  considerations and indoor options.  Pt voiced understanding.  Dana Hill reviewed what was said during home exercise last week. She said she went to the gym at her complex and plans to go tomorrow to add another day of exercise  Dana Hill has been doing well in rehab.  She is now up to 2.0 mph on the treadmill and level 3 on the T5 NuStep.  We will continue to monitor her progression.     Expected Outcomes  Short: Use RPE daily to regulate intensity.  Long: Follow program prescription in THR.  Short: Continue to attend regularly.  Long: Continue to work on IT sales professional.   Short: Start going to gym 1 extra day a week.  Long: Exercise more on off days from rehab.   Short: continue to exercise one extra day outside of LW. Long: exercise independently.   Short: Continue to add in her home exercise at least one extra day a week.  Long: Continue to build strength and stamina.    [place] Name 08/18/17 1536 09/02/17 1423 09/16/17 1610 09/30/17 1456 10/15/17 1514     Exercise Goal Re-Evaluation   Exercise Goals Review  Increase Physical Activity;Understanding of Exercise Prescription;Increase Strength and Stamina  Increase Physical Activity;Understanding of Exercise Prescription;Increase Strength and Stamina  Increase Physical Activity;Understanding of Exercise Prescription;Increase  Strength and Stamina  -  Increase Physical Activity;Able to understand and use rate of perceived exertion (RPE) scale;Increase Strength and Stamina;Able to understand and use Dyspnea scale   Comments  Shigeko continues to do well in rehab.  She had done level 4 on T5 NuStep but went back to level 3 and went back down to 1.24mh on the treadmill.  We will talk to her about these at next visit. We will continue to monitor her progression.  RCarmencontinues to do well in rehab.  She had done level 4 on T5 NuStep but went back to level 3 and went back down to 1.868m on the treadmill.  We will talk to her about these at next visit. We will continue to  monitor her progression.  RoLatichaas been out for a week after dermatologic surgery.  She has only attended once since last reveiw.  We will continue to monitor her progression.   Out since last review.  Plans to return tomorrow 5/22.  Pt has only attended twice in May.  Regular attendance is required for her to make progress.     Expected Outcomes   Short: Return workloads back to where they should be.  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 Long: Continue to build strength and stamina.   Short: Return to rehab regularly.  Long: Continue to increase physical activity.   -  Short - Pt will attend regularly Long - Pt will increase MET level   Row Name 10/29/17 1457 11/11/17 1131 11/25/17 1454 11/28/17 1209       Exercise Goal Re-Evaluation   Exercise Goals Review  Increase Physical Activity;Increase Strength and Stamina;Understanding of Exercise Prescription  Increase Physical Activity;Increase Strength and Stamina;Understanding of Exercise Prescription  Increase Physical Activity;Increase Strength and Stamina;Understanding of Exercise Prescription  Increase Physical Activity;Increase Strength and Stamina;Understanding of Exercise Prescription    Comments  Dana Hill only attended once since last review.  She was able to maintain her workloads.  We will continue to monitor her progress.   Dana Hill's attendance has been getting better. She is now up to 1.8 mph on the treadmill.  We will begin to increase her other workloads as well.  We will continue to monitor her progression.   RoAnabellaas been out with health issues. She has only had one session since last review.  We will continue to monitor her progression.   RoDaneshias nearing graduation.  We will be doing her post 6MWT next week. I fully expect her to improve from the beginning.  She has been exercsing at home and using her oxygen at home.      Expected Outcomes  Short: Return to regular attendance.  Long:  Continue to work on buPress photographer  Short: Increase workloads.  Long: Continue to build strength and stamina.   Short: Continue to increase workloads.  Long: Continue to exercise on off days  Short: Improve post 6MWT and graduate!!  Long: Continue to exercise independently.        Discharge Exercise Prescription (Final Exercise Prescription Changes): Exercise Prescription Changes - 11/25/17 1400      Response to Exercise   Blood Pressure (Admit)  130/84    Blood Pressure (Exit)  110/62    Heart Rate (Admit)  81 bpm    Heart Rate (Exercise)  94 bpm    Heart Rate (Exit)  71 bpm    Oxygen Saturation (Admit)  94 %    Oxygen Saturation (Exercise)  91 %    Oxygen Saturation (Exit)  95 %    Rating of Perceived Exertion (Exercise)  13    Perceived Dyspnea (Exercise)  1    Symptoms  none    Duration  Continue with 45 min of aerobic exercise without signs/symptoms of physical distress.    Intensity  THRR unchanged      Progression   Progression  Continue to progress workloads to maintain intensity without signs/symptoms of physical distress.    Average METs  2.23      Resistance Training   Training Prescription  Yes    Weight  4 lbs    Reps  10-15      Interval Training   Interval Training  No      Oxygen   Oxygen  Continuous    Liters  2      Treadmill   MPH  1.8    Grade  0.5    Minutes  15    METs  2.5      Recumbant Elliptical   Level  4    RPM  40    Minutes  15    METs  2.2      T5 Nustep   Level  4    SPM  86    Minutes  15    METs  2.2      Home Exercise Plan   Plans to continue exercise at  Home (comment) gym in complex, treadmill, stairmill, walking    Frequency  Add 2 additional days to program exercise sessions.    Initial Home Exercises Provided  07/18/17       Nutrition:  Target Goals: Understanding of nutrition guidelines, daily intake of sodium <1568m, cholesterol <204m calories 30% from fat and 7% or less from saturated fats,  daily to have 5 or more servings of fruits and vegetables.  Biometrics: Pre Biometrics - 06/17/17 1629      Pre Biometrics   Height  5' 3.5" (1.613 m)    Weight  263 lb 3.2 oz (119.4 kg)    Waist Circumference  46 inches    Hip Circumference  59.25 inches    Waist to Hip Ratio  0.78 %    BMI (Calculated)  45.89        Nutrition Therapy Plan and Nutrition Goals: Nutrition Therapy & Goals - 06/17/17 1520      Personal Nutrition Goals   Comments  weight loss, eating healthier and learning about healthier options.      Intervention Plan   Intervention  Prescribe, educate and counsel regarding individualized specific dietary modifications aiming towards targeted core components such as weight, hypertension, lipid management, diabetes, heart failure and other comorbidities.;Nutrition handout(s) given to patient.    Expected Outcomes  Short Term Goal: Understand basic principles of dietary content, such as calories, fat, sodium, cholesterol and nutrients.;Short Term Goal: A plan has been developed with personal nutrition goals set during dietitian appointment.;Long Term Goal: Adherence to prescribed nutrition plan.       Nutrition Assessments: Nutrition Assessments - 06/17/17 1520      MEDFICTS Scores   Pre Score  62       Nutrition Goals Re-Evaluation: Nutrition Goals Re-Evaluation    Row Name 07/14/17 1157 07/25/17 1153 09/10/17 1228 11/05/17 1219 11/28/17 1224     Goals   Current Weight  264 lb (119.7 kg)  261 lb (118.4 kg)  262 lb (  118.8 kg)  268 lb (121.6 kg)  -   Nutrition Goal  -  -  -  Weight loss, learn what nutrition she is getting, Be more consistant with diet.  Weight loss.  Continue to eat healthy.   Comment  Klohe will schedule with RD and folow her recommendations  Alyxandra had to miss her initial appointment with the RD due to home issues, but has a second appointment the end of this month. She reported not eating out as much and watching her portion sizes.   Kwynn  did not want to reschedule her nutrition appointment, but knows she can change her mind at any point. She reports that she is being more mindful of food labels and cooking healthier options at home  Triston states she knows what to do with her diet but she is not consistent. She wants to lose weight.  Jonie usually cooks at home and they try to eat healthy.  She is getting her vegetable and fruits.  Last night she made turnip greens with olive oil versus lard!!  She is getting whole grains and reading food labels.  She is trying to make sure she is getting enough protien in her diet.    Expected Outcome  Short - Alveria will meet with RD  Long - Malvika will continue to lose weight  Short: Earnestine will meet with the dietician. Long: Hebah will work on eating healthier and get to her goal weight.   Short: Nilani will attend the General Nutrition class in [facility]. Long: Sudiksha will become independent in eaiting a healthy diet and reach her goal weight.   Short: lose 2 pounds by Monday. Long: lose 5 pounds before she graduates [facility].  Short: Continue to work on weight loss and exercise more. Long: Continue to focus on healthy eating.       Nutrition Goals Discharge (Final Nutrition Goals Re-Evaluation): Nutrition Goals Re-Evaluation - 11/28/17 1224      Goals   Nutrition Goal  Weight loss.  Continue to eat healthy.    Comment  Ileana usually cooks at home and they try to eat healthy.  She is getting her vegetable and fruits.  Last night she made turnip greens with olive oil versus lard!!  She is getting whole grains and reading food labels.  She is trying to make sure she is getting enough protien in her diet.     Expected Outcome  Short: Continue to work on weight loss and exercise more. Long: Continue to focus on healthy eating.        Psychosocial: Target Goals: Acknowledge presence or absence of significant depression and/or stress, maximize coping skills, provide positive support system. Participant is  able to verbalize types and ability to use techniques and skills needed for reducing stress and depression.   Initial Review & Psychosocial Screening: Initial Psych Review & Screening - 06/17/17 1517      Initial Review   Current issues with  Current Depression;History of Depression;Current Psychotropic Meds;Current Stress Concerns    Source of Stress Concerns  Chronic Illness;Unable to perform yard/household activities;Family    Comments  Her children do not know that she has emphysema.      Family Dynamics   Good Support System?  Yes    Comments  Her boyfriend of 15 years is a great support system for her.      Barriers   Psychosocial barriers to participate in program  Psychosocial barriers identified (see note);The patient should benefit from training  in stress management and relaxation.      Screening Interventions   Interventions  Encouraged to exercise;Provide feedback about the scores to participant;Program counselor consult;To provide support and resources with identified psychosocial needs    Expected Outcomes  Short Term goal: Utilizing psychosocial counselor, staff and physician to assist with identification of specific Stressors or current issues interfering with healing process. Setting desired goal for each stressor or current issue identified.;Long Term Goal: Stressors or current issues are controlled or eliminated.;Short Term goal: Identification and review with participant of any Quality of Life or Depression concerns found by scoring the questionnaire.;Long Term goal: The participant improves quality of Life and PHQ9 Scores as seen by post scores and/or verbalization of changes       Quality of Life Scores:  Scores of 19 and below usually indicate a poorer quality of life in these areas.  A difference of  2-3 points is a clinically meaningful difference.  A difference of 2-3 points in the total score of the Quality of Life Index has been associated with significant  improvement in overall quality of life, self-image, physical symptoms, and general health in studies assessing change in quality of life.  PHQ-9: Recent Review Flowsheet Data    Depression screen [facility] 2/9 12/01/2017 06/17/2017   Decreased Interest 1 1   Down, Depressed, Hopeless 0 0   PHQ - 2 Score 1 1   Altered sleeping 0 0   Tired, decreased energy 2 2   Change in appetite 1 1   Feeling bad or failure about yourself  1 2   Trouble concentrating 0 0   Moving slowly or fidgety/restless 0 0   Suicidal thoughts 0 0   PHQ-9 Score 5 6   Difficult doing work/chores Not difficult at all Somewhat difficult     Interpretation of Total Score  Total Score Depression Severity:  1-4 = Minimal depression, 5-9 = Mild depression, 10-14 = Moderate depression, 15-19 = Moderately severe depression, 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 Nyoka Cowden Shirlean Mylar) today for initial psychosocial evaluation.  She is a 57 year old who struggles with COPD.  Jessenia has a strong support system with a significant other of 13 years; and (2) sisters and a brother who are local and involved.  Arlean reports sleeping "okay" other than being in pain with her arthritis.  Others report she is very "restless" at night and her arms are moving often.  She has had several sleep studies and it has been determined she does not have sleep apnea.  Marissa reports a history of depression and anxiety and has been on medications for this for quite some time.  She reports these are helping and her mood is stable at this time; other than sadness over the recent loss of her 37 year old dog.  Vernie has goals to breathe and feel better over all; to lose weight and to  generally have a better quality of life.  Staff will follow with Khai throughout the course of this program.      Expected Outcomes  Sherilynn will benefit from consistent exercise to achieve her stated goals.  She will meet with the dietician to address her weight loss goal.  Tierany will also benefit from the  educational and psychoeducational components of this program to learn more about her condition and discover more positive ways of coping in general.      Continue Psychosocial Services   Follow up required by staff       Psychosocial Re-Evaluation: Psychosocial Re-Evaluation    [place] Name 07/14/17 1158 07/25/17 1154 09/10/17 1232 11/05/17 1227 11/28/17 1227     Psychosocial Re-Evaluation   Current issues with  Current Stress Concerns  Current Stress Concerns  Current Stress Concerns  Current Stress Concerns  Current Stress Concerns   Comments  Glynna is still stressed from losing her dog and other family issues - nephew and sister.  Chanler is starting to heal from losing her dog. She is enjoying coming to [facility] as a way to step away from work as well.   Gulianna reports continual healing from the loss of her dog. She is staying busy with working at home, going out with her three sons and coming to [employer]. She reports work is getting better and is no longer a stressor  Consuela got a new car that has payments and things are going well with that. Her old car died and she was upset with that. Smoking is worrying her alot and knows it is not easy to quit. She wants to be strong and quit smoking. She states she should work in the office and that would help her quit more. Her boyfriend smokes and she has talked to him about quitting together.  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 She was getting discouraged while she was out and she could not exercise because her weight was going up instead of down and she was missing her exercise.  She has set a quit date for next Wednesday. She is still  doing well with car payments and got them were they needed to be.  Her boyfriend has quit smoking and is supportive.  She has finally reached a place where things are starting to calm down. Her biggest stressor is the heat currently.  She is considering joining [employer].  She is leaning towards coming 3 days a week.    Expected Outcomes  Short - Jasey is looking for another dog Long - Jady will continue to exercise to get out of the house and deal with stress  Short: Lanetta will continue to come to [place]; Long: Cordie will find other activities besides LW to get out of the house.   Short: Syrai will graduate from [place]: Namiko will enroll in [employer] or another exercise program to stay active and get out of the house  Short: decrease smoking intake with her boyfriend. Long: Quit smoking to decrease stress.  Short: Graduate and quit smoking.  Long: Continue to decrease stress and cope postively.    Interventions  Encouraged to attend Pulmonary Rehabilitation for the exercise  Encouraged to attend Pulmonary Rehabilitation for the exercise  -  Encouraged to attend Pulmonary Rehabilitation for the exercise  Encouraged to attend Pulmonary Rehabilitation for the exercise   Continue Psychosocial Services   Follow up required by staff  -  -  Follow up required by staff  Follow up required by staff      Psychosocial Discharge (Final Psychosocial Re-Evaluation): Psychosocial Re-Evaluation - 11/28/17 1227      Psychosocial Re-Evaluation   Current issues with  Current Stress Concerns    Comments  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 She was getting  discouraged while she was out and she could not exercise because her weight was going up instead of down and she was missing her exercise.  She has set a quit date for next Wednesday. She is still doing well with car payments and got them were they needed to be.  Her boyfriend has quit smoking and is supportive.  She has finally reached  a place where things are starting to calm down. Her biggest stressor is the heat currently.  She is considering joining [employer].  She is leaning towards coming 3 days a week.     Expected Outcomes  Short: Graduate and quit smoking.  Long: Continue to decrease stress and cope postively.     Interventions  Encouraged to attend Pulmonary Rehabilitation for the exercise    Continue Psychosocial Services   Follow up required by staff       Education: Education Goals: Education classes will be provided on a weekly basis, covering required topics. Participant will state understanding/return demonstration of topics presented.  Learning Barriers/Preferences: Learning Barriers/Preferences - 06/17/17 1523      Learning Barriers/Preferences   Learning Barriers  None    Learning Preferences  None       Education Topics:  Initial Evaluation Education: - Verbal, written and demonstration of respiratory meds, oximetry and breathing techniques. Instruction on use of nebulizers and MDIs and importance of monitoring MDI activations.   Pulmonary Rehab from 12/01/2017 in [facility] Cardiac and Pulmonary Rehab  Date  06/17/17  Educator  [facility]  Instruction Review Code  1- Verbalizes Understanding      General Nutrition Guidelines/Fats and Fiber: -Group instruction provided by verbal, written material, models and posters to present the general guidelines for heart healthy nutrition. Gives an explanation and review of dietary fats and fiber.   Pulmonary Rehab from 12/01/2017 in [facility] Cardiac and Pulmonary Rehab  Date  08/04/17  Educator  CR  Instruction Review Code  1- Verbalizes Understanding      Controlling Sodium/Reading Food Labels: -Group verbal and written material supporting the discussion of sodium use in heart healthy nutrition. Review and explanation with models, verbal and written materials for utilization of the food label.   Pulmonary Rehab from 12/01/2017 in [facility] Cardiac and Pulmonary Rehab   Date  12/01/17  Educator  CR  Instruction Review Code  1- Verbalizes Understanding      Exercise Physiology & General Exercise Guidelines: - Group verbal and written instruction with models to review the exercise physiology of the cardiovascular system and associated critical values. Provides general exercise guidelines with specific guidelines to those with heart or lung disease.    Pulmonary Rehab from 12/01/2017 in [facility] Cardiac and Pulmonary Rehab  Date  10/31/17  Educator  [facility]  Instruction Review Code  1- Verbalizes Understanding      Aerobic Exercise & Resistance Training: - Gives group verbal and written instruction on the various components of exercise. Focuses on aerobic and resistive training programs and the benefits of this training and how to safely progress through these programs.   Pulmonary Rehab from 12/01/2017 in [facility] Cardiac and Pulmonary Rehab  Date  11/26/17  Educator  [facility]  Instruction Review Code  1- Verbalizes Understanding      Flexibility, Balance, Mind/Body Relaxation: Provides group verbal/written instruction on the benefits of flexibility and balance training, including mind/body exercise modes such as yoga, pilates and tai chi.  Demonstration and skill practice provided.   Pulmonary Rehab from 12/01/2017 in [facility] Cardiac and Pulmonary  Rehab  Date  06/25/17  Educator  AS  Instruction Review Code  1- Verbalizes Understanding      Stress and Anxiety: - Provides group verbal and written instruction about the health risks of elevated stress and causes of high stress.  Discuss the correlation between heart/lung disease and anxiety and treatment options. Review healthy ways to manage with stress and anxiety.   Pulmonary Rehab from 12/01/2017 in [facility] Cardiac and Pulmonary Rehab  Date  10/08/17  Educator  [facility]  Instruction Review Code  1- Verbalizes Understanding      Depression: - Provides group verbal and written instruction on the correlation between  heart/lung disease and depressed mood, treatment options, and the stigmas associated with seeking treatment.   Exercise & Equipment Safety: - Individual verbal instruction and demonstration of equipment use and safety with use of the equipment.   Pulmonary Rehab from 12/01/2017 in [facility] Cardiac and Pulmonary Rehab  Date  06/17/17  Educator  [facility]  Instruction Review Code  1- Verbalizes Understanding      Infection Prevention: - Provides verbal and written material to individual with discussion of infection control including proper hand washing and proper equipment cleaning during exercise session.   Pulmonary Rehab from 12/01/2017 in [facility] Cardiac and Pulmonary Rehab  Date  06/17/17  Educator  [facility]  Instruction Review Code  1- Verbalizes Understanding      Falls Prevention: - Provides verbal and written material to individual with discussion of falls prevention and safety.   Pulmonary Rehab from 12/01/2017 in [facility] Cardiac and Pulmonary Rehab  Date  06/17/17  Educator  [facility]  Instruction Review Code  1- Verbalizes Understanding      Diabetes: - Individual verbal and written instruction to review signs/symptoms of diabetes, desired ranges of glucose level fasting, after meals and with exercise. Advice that pre and post exercise glucose checks will be done for 3 sessions at entry of program.   Chronic Lung Diseases: - Group verbal and written instruction to review updates, respiratory medications, advancements in procedures and treatments. Discuss use of supplemental oxygen including available portable oxygen systems, continuous and intermittent flow rates, concentrators, personal use and safety guidelines. Review proper use of inhaler and spacers. Provide informative websites for self-education.    Energy Conservation: - Provide group verbal and written instruction for methods to conserve energy, plan and organize activities. Instruct on pacing techniques, use of adaptive equipment and  posture/positioning to relieve shortness of breath.   Triggers and Exacerbations: - Group verbal and written instruction to review types of environmental triggers and ways to prevent exacerbations. Discuss weather changes, air quality and the benefits of nasal washing. Review warning signs and symptoms to help prevent infections. Discuss techniques for effective airway clearance, coughing, and vibrations.   AED/CPR: - Group verbal and written instruction with the use of models to demonstrate the basic use of the AED with the basic ABC's of resuscitation.   Pulmonary Rehab from 12/01/2017 in [facility] Cardiac and Pulmonary Rehab  Date  07/11/17  Educator  [facility]  Instruction Review Code  1- Actuary and Physiology of the Lungs: - Group verbal and written instruction with the use of models to provide basic lung anatomy and physiology related to function, structure and complications of lung disease.   Pulmonary Rehab from 12/01/2017 in [facility] Cardiac and Pulmonary Rehab  Date  07/23/17  Educator  [facility]  Instruction Review Code  1- Actuary &  Physiology of the Heart: - Group verbal and written instruction and models provide basic cardiac anatomy and physiology, with the coronary electrical and arterial systems. Review of Valvular disease and Heart Failure   Pulmonary Rehab from 12/01/2017 in [facility] Cardiac and Pulmonary Rehab  Date  09/10/17  Educator  [facility]  Instruction Review Code  1- Verbalizes Understanding      Cardiac Medications: - Group verbal and written instruction to review commonly prescribed medications for heart disease. Reviews the medication, class of the drug, and side effects.   Pulmonary Rehab from 12/01/2017 in [facility] Cardiac and Pulmonary Rehab  Date  06/27/17  Educator  [facility]  Instruction Review Code  1- Verbalizes Understanding      Know Your Numbers and Risk Factors: -Group verbal and written instruction about important  numbers in your health.  Discussion of what are risk factors and how they play a role in the disease process.  Review of Cholesterol, Blood Pressure, Diabetes, and BMI and the role they play in your overall health.   Pulmonary Rehab from 12/01/2017 in [facility] Cardiac and Pulmonary Rehab  Date  08/22/17  Educator  [facility]  Instruction Review Code  1- Verbalizes Understanding      Sleep Hygiene: -Provides group verbal and written instruction about how sleep can affect your health.  Define sleep hygiene, discuss sleep cycles and impact of sleep habits. Review good sleep hygiene tips.    Pulmonary Rehab from 12/01/2017 in [facility] Cardiac and Pulmonary Rehab  Date  11/05/17  Educator  [facility]  Instruction Review Code  1- Verbalizes Understanding      Other: -Provides group and verbal instruction on various topics (see comments)    Knowledge Questionnaire Score: Knowledge Questionnaire Score - 12/01/17 1148      Knowledge Questionnaire Score   Post Score  16        Core Components/Risk Factors/Patient Goals at Admission: Personal Goals and Risk Factors at Admission - 06/17/17 1526      Core Components/Risk Factors/Patient Goals on Admission    Weight Management  Yes;Obesity;Weight Loss    Intervention  Weight Management: Develop a combined nutrition and exercise program designed to reach desired caloric intake, while maintaining appropriate intake of nutrient and fiber, sodium and fats, and appropriate energy expenditure required for the weight goal.;Weight Management: Provide education and appropriate resources to help participant work on and attain dietary goals.;Weight Management/Obesity: Establish reasonable short term and long term weight goals.;Obesity: Provide education and appropriate resources to help participant work on and attain dietary goals.    Admit Weight  263 lb 3.2 oz (119.4 kg)    Goal Weight: Short Term  258 lb (117 kg)    Goal Weight: Long Term  160 lb (72.6 kg)    Expected  Outcomes  Short Term: Continue to assess and modify interventions until short term weight is achieved;Long Term: Adherence to nutrition and physical activity/exercise program aimed toward attainment of established weight goal;Weight Maintenance: Understanding of the daily nutrition guidelines, which includes 25-35% calories from fat, 7% or less cal from saturated fats, less than 241m cholesterol, less than 1.5gm of sodium, & 5 or more servings of fruits and vegetables daily;Weight Loss: Understanding of general recommendations for a balanced deficit meal plan, which promotes 1-2 lb weight loss per week and includes a negative energy balance of [phone] kcal/d;Understanding recommendations for meals to include 15-35% energy as protein, 25-35% energy from fat, 35-60% energy from carbohydrates, less than 2025mof dietary cholesterol, 20-35 gm of total  fiber daily;Understanding of distribution of calorie intake throughout the day with the consumption of 4-5 meals/snacks    Tobacco Cessation  Yes    Number of packs per day  4 cigarettes a day    Intervention  Assist the participant in steps to quit. Provide individualized education and counseling about committing to Tobacco Cessation, relapse prevention, and pharmacological support that can be provided by physician.;Advice worker, assist with locating and accessing local/national Quit Smoking programs, and support quit date choice.    Expected Outcomes  Short Term: Will demonstrate readiness to quit, by selecting a quit date.;Long Term: Complete abstinence from all tobacco products for at least 12 months from quit date.;Short Term: Will quit all tobacco product use, adhering to prevention of relapse plan.    Improve shortness of breath with ADL's  Yes    Intervention  Provide education, individualized exercise plan and daily activity instruction to help decrease symptoms of SOB with activities of daily living.    Expected Outcomes  Short Term:  Improve cardiorespiratory fitness to achieve a reduction of symptoms when performing ADLs;Long Term: Be able to perform more ADLs without symptoms or delay the onset of symptoms    Hypertension  Yes    Intervention  Provide education on lifestyle modifcations including regular physical activity/exercise, weight management, moderate sodium restriction and increased consumption of fresh fruit, vegetables, and low fat dairy, alcohol moderation, and smoking cessation.;Monitor prescription use compliance.    Expected Outcomes  Short Term: Continued assessment and intervention until BP is < 140/58m HG in hypertensive participants. < 130/835mHG in hypertensive participants with diabetes, heart failure or chronic kidney disease.;Long Term: Maintenance of blood pressure at goal levels.       Core Components/Risk Factors/Patient Goals Review:  Goals and Risk Factor Review    Row Name 07/14/17 1152 07/14/17 1247 07/25/17 1149 09/10/17 1221 10/08/17 1140     Core Components/Risk Factors/Patient Goals Review   Personal Goals Review  Tobacco Cessation;Improve shortness of breath with ADL's;Weight Management/Obesity  Tobacco Cessation  Weight Management/Obesity;Improve shortness of breath with ADL's;Tobacco Cessation  Tobacco Cessation;Weight Management/Obesity;Hypertension;Improve shortness of breath with ADL's  -   Review  Dana Hill she is changing her diet to eat healthy - reducing portion sizes, especially sweets.  They are eating at home more often.  She is down to 3 cigarettes per day - one after each meal.  She state she has more energy since beginning to exercise.  She is checking her O2 and it was better today.  She is now taking wellbutrin.  Also gave her a fake cigarette to use to help stop smoking.  She also plans to use the patches if needed.   Dana Hill down 2 lb with the motivation to keep losing weight. Her shortness of breath has decreased since starting LW and she has noticed an improvement in  her stamina. She is up to 5 cigs yesterday, but felt guilty about it and getting back to three a day and then quitting. She is very motivated to quit.   Dana Hill down to 162 (admit [phone] She is really wanting to lose more, but has had some set backs recently with a growth having to be cut off of her leg and issues with the stitches. She is wanting to work hard these next few weeks in the program to lose weight and work on her SOB. She can tell a huge difference in her breathing, especially after how it became worse with her being  out of [facility]. She is looking forward to it improving again. With her extra time at home, she picked back up smoking 5-6 cigs a day, but is wanting to try the patch.   Dana Hill returned after a month long absence due to her leg procedure. Hopefully plans to attend regularly.    Expected Outcomes  Short - Genasis will schedule and meet with RD and continue to eat healthy  Long - Kaelah will reach first weight goal and continue to eat healthy  Short: Continue to work on quitting and use new fake cigarette.  Long: Continue to work towards cessation.   Short: continue on working on quitting smoking, come to [place] to work on SOB, stamina, and weight goals Long: continue to work on cessation and work on losing weight to get to her end goal of 160 lb.   Short: Dana Hill will continue to lose weight and improve SOB by attending [facility] and decrease smoking in a timely manner. Long: Dana Hill will graduate [facility] and quit smoking.   -   Row Name 11/05/17 1215 11/28/17 1216           Core Components/Risk Factors/Patient Goals Review   Personal Goals Review  Tobacco Cessation;Weight Management/Obesity;Hypertension;Improve shortness of breath with ADL's  Tobacco Cessation;Weight Management/Obesity;Hypertension;Improve shortness of breath with ADL's      Review  Dana Hill has not decresed her smoking intake. She is trying to quit but cant find the time to go to a smoking cessation class. Her blood  pressure has been doing better than when she started.  Dana Hill is taking her blood pressure more and keeping a closer eye on it.  She is down to four cigarettes a day. We are going to set her quit date for her walk test day next Wednesday!!  She wants to be done!!  Her weight is up some from being out and not moving as much.  But now she is back to being able to move and will start losing again.  She is doing better with her breathing overall.       Expected Outcomes  Short: work on decreasing her smoking intake. Long: quit smoking.  Short: Successful meet quit date to quit smoking!!   Long: Continue to work on weight loss!!         Core Components/Risk Factors/Patient Goals at Discharge (Final Review):  Goals and Risk Factor Review - 11/28/17 1216      Core Components/Risk Factors/Patient Goals Review   Personal Goals Review  Tobacco Cessation;Weight Management/Obesity;Hypertension;Improve shortness of breath with ADL's    Review  Dana Hill is taking her blood pressure more and keeping a closer eye on it.  She is down to four cigarettes a day. We are going to set her quit date for her walk test day next Wednesday!!  She wants to be done!!  Her weight is up some from being out and not moving as much.  But now she is back to being able to move and will start losing again.  She is doing better with her breathing overall.     Expected Outcomes  Short: Successful meet quit date to quit smoking!!   Long: Continue to work on weight loss!!       ITP Comments: ITP Comments    Row Name 06/17/17 1428 06/23/17 0935 07/21/17 0837 08/18/17 0900 09/15/17 0848   ITP Comments  Medical Evaluation completed. Chart sent for review and changes to [name] Director of [place]. Diagnosis can be found in  [facility] encounter 05/30/17  30 day review completed. ITP sent to [name] Director of [place]. Continue with ITP unless changes are made by physician.  30 day review completed. ITP sent to [name] Director  of [place]. Continue with ITP unless changes are made by physician.   30 day review completed. ITP sent to [name] Director of [place]. Continue with ITP unless changes are made by physician   30 day review completed. ITP sent to [name] Director of [place]. Continue with ITP unless changes are made by physician   Row Name 09/30/17 1456 10/01/17 1439 10/08/17 1138 10/10/17 1114 10/13/17 0837   ITP Comments  Called to check on status of return.  She had another place removed on her shin on 5/9. She had a wound check today and was cleared to return to rehab tomorrow.    Ikeya called to let us know that her wound started to bleed again today.  She was to go see the doctor today and will return next week.   Miesha returned after a month long absence due to her leg procedure. Hopefully plans to attend regularly.   Tameaka called to let us know that she will be out until next Wednesday. She got a surprise beach trip.    30 day review completed. ITP sent to [name] Director of [place]. Continue with ITP unless changes are made by physician   Row Name 11/10/17 1524 12/08/17 0903         ITP Comments   30 day review completed. ITP sent to [name] Director of [place]. Continue with ITP unless changes are made by physician   30 day review completed. ITP sent to [name] Director of [place]. Continue with ITP unless changes are made by physician         Comments: 30 day review

## 2017-12-08 NOTE — Telephone Encounter (Signed)
Dana BallRobin [facility] and does not know when she will be back to [facility].

## 2017-12-12 ENCOUNTER — Encounter: Payer: BLUE CROSS/BLUE SHIELD | Attending: Internal Medicine | Admitting: *Deleted

## 2017-12-12 VITALS — Ht 65.5 in | Wt 270.7 lb

## 2017-12-12 DIAGNOSIS — J439 Emphysema, unspecified: Secondary | ICD-10-CM | POA: Insufficient documentation

## 2017-12-12 NOTE — Progress Notes (Signed)
Daily Session Note  [name] MRN: [id number] Date of Birth: 09-Nov-1960 Referring Provider:     Pulmonary Rehab from 06/17/2017 in [facility] Cardiac and Pulmonary Rehab  Referring Provider  Tereasa Coop MD      Encounter Date: 12/12/2017  Check In: Session Check In - 12/12/17 1137      Check-In   Supervising physician immediately available to respond to emergencies  [facility] immediately available ER MD    Physician(s)  Drs. Saidecki and [employer] traffic [employer] & Pulmonary Rehab    Staff Present  Renita Papa, RN BSN;Nikkole Placzek Luan [place], [place], RCEP, CCRP, [name];Joseph Tessie Fass RCP,[name],[name]    Medication changes reported      No    Fall or balance concerns reported     No    Warm-up and Cool-down  Performed as group-led instruction    Resistance Training Performed  Yes    VAD Patient?  No    PAD/SET Patient?  No      Pain Assessment   Currently in Pain?  No/denies          Social History   Tobacco Use  Smoking Status Current Every Day Smoker  . Packs/day: 0.25  . Years: 25.00  . Pack years: 6.25  . Types: Cigarettes  Smokeless Tobacco Never Used  Tobacco Comment   given fake cigarette today, she is on wellbutrin and has patches on standby.    Goals Met:  Proper associated with RPD/PD & O2 Sat [place] with exercise equipment Using PLB without cueing & demonstrates good technique Exercise tolerated well No report of cardiac concerns or symptoms Strength training completed today  Goals Unmet:  Not Applicable  Comments: Pt able to follow exercise prescription today without complaint.  Will continue to monitor for progression. Dana Hill Name 06/17/17 1620 12/12/17 1216       6 Minute Walk   Phase  Initial  Discharge    Distance  894 feet  800 feet    Distance % Change  -  -10.5 %    Distance Feet Change  -  -94 ft    Walk Time  6 minutes  6 minutes    # of Rest Breaks  0  0    MPH  1.69  1.52    METS   2.17  1.49    RPE  13  13    Perceived Dyspnea   2  2    VO2 Peak  7.6  5.22    Symptoms  Yes (comment)  No    Comments  SOB, hip pain 5-6/10, back pain 3/10  -    Resting HR  69 bpm  79 bpm    Resting BP  136/74  140/62    Resting Oxygen Saturation   93 % 88% on Room Air  98 %    Exercise Oxygen Saturation  during 6 min walk  88 %  93 %    Max Ex. HR  127 bpm  92 bpm    Max Ex. BP  146/84  136/62    2 Minute Post BP  134/76  134/64      Interval HR   1 Minute HR  86  91    2 Minute HR  111  90    3 Minute HR  107  92    4 Minute HR  127  90    5 Minute HR  108  90    6 Minute HR  112  92    2 Minute Post HR  75  78    Interval Heart Rate?  Yes  Yes      Interval Oxygen   Interval Oxygen?  Yes  Yes    Baseline Oxygen Saturation %  93 %  98 %    1 Minute Oxygen Saturation %  89 %  95 %    1 Minute Liters of Oxygen  3 L  3 L    2 Minute Oxygen Saturation %  88 %  93 %    2 Minute Liters of Oxygen  3 L  3 L    3 Minute Oxygen Saturation %  89 %  94 %    3 Minute Liters of Oxygen  3 L  3 L    4 Minute Oxygen Saturation %  88 %  95 %    4 Minute Liters of Oxygen  3 L  3 L    5 Minute Oxygen Saturation %  89 %  95 %    5 Minute Liters of Oxygen  3 L  3 L    6 Minute Oxygen Saturation %  88 %  96 %    6 Minute Liters of Oxygen  3 L  3 L    2 Minute Post Oxygen Saturation %  96 %  99 %    2 Minute Post Liters of Oxygen  3 L  3 L       Dana Hill graduated today from  rehab with 35 sessions completed.  Details of the patient's exercise prescription and what She needs to do in order to continue the prescription and progress were discussed with patient.  Patient was given a copy of prescription and goals.  Patient verbalized understanding.  Dana Hill plans to continue to exercise by joining [employer].    [name] is Medical Director for [place] and [facility]monary Rehabilitation.

## 2017-12-12 NOTE — Patient Instructions (Signed)
Discharge Patient Instructions  [name] MRN: [id number] Date of Birth: 09/12/1960 Referring Provider:  Alessandra Grout, MD   Number of Visits: 63  Reason for Discharge:  Patient reached a stable level of exercise. Patient independent in their exercise. Patient has met program and personal goals.  Smoking History:  Social History   Tobacco Use  Smoking Status Current Every Day Smoker  . Packs/day: 0.25  . Years: 25.00  . Pack years: 6.25  . Types: Cigarettes  Smokeless Tobacco Never Used  Tobacco Comment   given fake cigarette today, she is on wellbutrin and has patches on standby.    Diagnosis:  Pulmonary emphysema, unspecified emphysema type ([place])  Initial Exercise Prescription: Initial Exercise Prescription - 06/17/17 1600      Date of Initial Exercise RX and Referring Provider   Date  06/17/17    Referring Provider  Tereasa Coop MD      Oxygen   Oxygen  Continuous    Liters  3-4 4L on treadmill      Treadmill   MPH  1.5    Grade  0.5    Minutes  15    METs  2.25      Recumbant Elliptical   Level  1    RPM  50    Minutes  15    METs  2.1      T5 Nustep   Level  1    SPM  80    Minutes  15    METs  2.1      Prescription Details   Frequency (times per week)  3    Duration  Progress to 45 minutes of aerobic exercise without signs/symptoms of physical distress      Intensity   THRR 40-80% of Max Heartrate  106-143    Ratings of Perceived Exertion  11-13    Perceived Dyspnea  0-4      Progression   Progression  Continue to progress workloads to maintain intensity without signs/symptoms of physical distress.      Resistance Training   Training Prescription  Yes    Weight  3 lbs    Reps  10-15       Discharge Exercise Prescription (Final Exercise Prescription Changes): Exercise Prescription Changes - 12/09/17 1500      Response to Exercise   Blood Pressure (Admit)  130/68    Blood Pressure (Exercise)   138/64    Blood Pressure (Exit)  120/60    Heart Rate (Admit)  82 bpm    Heart Rate (Exercise)  102 bpm    Heart Rate (Exit)  81 bpm    Oxygen Saturation (Admit)  94 %    Oxygen Saturation (Exercise)  90 %    Oxygen Saturation (Exit)  95 %    Rating of Perceived Exertion (Exercise)  15    Perceived Dyspnea (Exercise)  4    Symptoms  none    Duration  Continue with 45 min of aerobic exercise without signs/symptoms of physical distress.    Intensity  THRR unchanged      Progression   Progression  Continue to progress workloads to maintain intensity without signs/symptoms of physical distress.    Average METs  2.25      Resistance Training   Training Prescription  Yes    Weight  4 lbs    Reps  10-15      Interval Training   Interval Training  No  Oxygen   Oxygen  Continuous    Liters  2      Treadmill   MPH  1.8    Grade  0.5    Minutes  15    METs  2.5      Recumbant Elliptical   Level  4    Minutes  15    METs  2.3      T5 Nustep   Level  4    Minutes  15    METs  2      Home Exercise Plan   Plans to continue exercise at  Home (comment) gym in complex, treadmill, stairmill, walking    Frequency  Add 2 additional days to program exercise sessions.    Initial Home Exercises Provided  07/18/17       Functional Capacity: 6 Minute Walk    Row Name 06/17/17 1620 12/12/17 1216       6 Minute Walk   Phase  Initial  Discharge    Distance  894 feet  800 feet    Distance % Change  -  -10.5 %    Distance Feet Change  -  -94 ft    Walk Time  6 minutes  6 minutes    # of Rest Breaks  0  0    MPH  1.69  1.52    METS  2.17  1.49    RPE  13  13    Perceived Dyspnea   2  2    VO2 Peak  7.6  5.22    Symptoms  Yes (comment)  No    Comments  SOB, hip pain 5-6/10, back pain 3/10  -    Resting HR  69 bpm  79 bpm    Resting BP  136/74  140/62    Resting Oxygen Saturation   93 % 88% on Room Air  98 %    Exercise Oxygen Saturation  during 6 min walk  88 %  93 %     Max Ex. HR  127 bpm  92 bpm    Max Ex. BP  146/84  136/62    2 Minute Post BP  134/76  134/64      Interval HR   1 Minute HR  86  91    2 Minute HR  111  90    3 Minute HR  107  92    4 Minute HR  127  90    5 Minute HR  108  90    6 Minute HR  112  92    2 Minute Post HR  75  78    Interval Heart Rate?  Yes  Yes      Interval Oxygen   Interval Oxygen?  Yes  Yes    Baseline Oxygen Saturation %  93 %  98 %    1 Minute Oxygen Saturation %  89 %  95 %    1 Minute Liters of Oxygen  3 L  3 L    2 Minute Oxygen Saturation %  88 %  93 %    2 Minute Liters of Oxygen  3 L  3 L    3 Minute Oxygen Saturation %  89 %  94 %    3 Minute Liters of Oxygen  3 L  3 L    4 Minute Oxygen Saturation %  88 %  95 %    4 Minute Liters of Oxygen  3 L  3 L    5 Minute Oxygen Saturation %  89 %  95 %    5 Minute Liters of Oxygen  3 L  3 L    6 Minute Oxygen Saturation %  88 %  96 %    6 Minute Liters of Oxygen  3 L  3 L    2 Minute Post Oxygen Saturation %  96 %  99 %    2 Minute Post Liters of Oxygen  3 L  3 L       Quality of Life:   Personal Goals: Goals established at orientation with interventions provided to work toward goal. Personal Goals and Risk Factors at Admission - 06/17/17 1526      Core Components/Risk Factors/Patient Goals on Admission    Weight Management  Yes;Obesity;Weight Loss    Intervention  Weight Management: Develop a combined nutrition and exercise program designed to reach desired caloric intake, while maintaining appropriate intake of nutrient and fiber, sodium and fats, and appropriate energy expenditure required for the weight goal.;Weight Management: Provide education and appropriate resources to help participant work on and attain dietary goals.;Weight Management/Obesity: Establish reasonable short term and long term weight goals.;Obesity: Provide education and appropriate resources to help participant work on and attain dietary goals.    Admit Weight  263 lb 3.2 oz  (119.4 kg)    Goal Weight: Short Term  258 lb (117 kg)    Goal Weight: Long Term  160 lb (72.6 kg)    Expected Outcomes  Short Term: Continue to assess and modify interventions until short term weight is achieved;Long Term: Adherence to nutrition and physical activity/exercise program aimed toward attainment of established weight goal;Weight Maintenance: Understanding of the daily nutrition guidelines, which includes 25-35% calories from fat, 7% or less cal from saturated fats, less than '200mg'$  cholesterol, less than 1.5gm of sodium, & 5 or more servings of fruits and vegetables daily;Weight Loss: Understanding of general recommendations for a balanced deficit meal plan, which promotes 1-2 lb weight loss per week and includes a negative energy balance of [phone] kcal/d;Understanding recommendations for meals to include 15-35% energy as protein, 25-35% energy from fat, 35-60% energy from carbohydrates, less than '200mg'$  of dietary cholesterol, 20-35 gm of total fiber daily;Understanding of distribution of calorie intake throughout the day with the consumption of 4-5 meals/snacks    Tobacco Cessation  Yes    Number of packs per day  4 cigarettes a day    Intervention  Assist the participant in steps to quit. Provide individualized education and counseling about committing to Tobacco Cessation, relapse prevention, and pharmacological support that can be provided by physician.;Advice worker, assist with locating and accessing local/national Quit Smoking programs, and support quit date choice.    Expected Outcomes  Short Term: Will demonstrate readiness to quit, by selecting a quit date.;Long Term: Complete abstinence from all tobacco products for at least 12 months from quit date.;Short Term: Will quit all tobacco product use, adhering to prevention of relapse plan.    Improve shortness of breath with ADL's  Yes    Intervention  Provide education, individualized exercise plan and daily activity  instruction to help decrease symptoms of SOB with activities of daily living.    Expected Outcomes  Short Term: Improve cardiorespiratory fitness to achieve a reduction of symptoms when performing ADLs;Long Term: Be able to perform more ADLs without symptoms or delay the onset of symptoms    Hypertension  Yes  Intervention  Provide education on lifestyle modifcations including regular physical activity/exercise, weight management, moderate sodium restriction and increased consumption of fresh fruit, vegetables, and low fat dairy, alcohol moderation, and smoking cessation.;Monitor prescription use compliance.    Expected Outcomes  Short Term: Continued assessment and intervention until BP is < 140/56m HG in hypertensive participants. < 130/812mHG in hypertensive participants with diabetes, heart failure or chronic kidney disease.;Long Term: Maintenance of blood pressure at goal levels.        Personal Goals Discharge: Goals and Risk Factor Review - 11/28/17 1216      Core Components/Risk Factors/Patient Goals Review   Personal Goals Review  Tobacco Cessation;Weight Management/Obesity;Hypertension;Improve shortness of breath with ADL's    Review  [place] taking her blood pressure more and keeping a closer eye on it.  She is down to four cigarettes a day. We are going to set her quit date for her walk test day next Wednesday!!  She wants to be done!!  Her weight is up some from being out and not moving as much.  But now she is back to being able to move and will start losing again.  She is doing better with her breathing overall.     Expected Outcomes  Short: Successful meet quit date to quit smoking!!   Long: Continue to work on weight loss!!       Exercise Goals and Review: Exercise Goals    Row Name 06/17/17 1628             Exercise Goals   Increase Physical Activity  Yes       Intervention  Provide advice, education, support and counseling about physical activity/exercise  needs.;Develop an individualized exercise prescription for aerobic and resistive training based on initial evaluation findings, risk stratification, comorbidities and participant's personal goals.       Expected Outcomes  Long Term: Add in home exercise to make exercise part of routine and to increase amount of physical activity.;Short Term: Attend rehab on a regular basis to increase amount of physical activity.;Long Term: Exercising regularly at least 3-5 days a week.       Increase Strength and Stamina  Yes       Intervention  Provide advice, education, support and counseling about physical activity/exercise needs.;Develop an individualized exercise prescription for aerobic and resistive training based on initial evaluation findings, risk stratification, comorbidities and participant's personal goals.       Expected Outcomes  Short Term: Increase workloads from initial exercise prescription for resistance, speed, and METs.;Short Term: Perform resistance training exercises routinely during rehab and add in resistance training at home;Long Term: Improve cardiorespiratory fitness, muscular endurance and strength as measured by increased METs and functional capacity (6MWT)       Able to understand and use rate of perceived exertion (RPE) scale  Yes       Intervention  Provide education and explanation on how to use RPE scale       Expected Outcomes  Short Term: Able to use RPE daily in rehab to express subjective intensity level;Long Term:  Able to use RPE to guide intensity level when exercising independently       Able to understand and use Dyspnea scale  Yes       Intervention  Provide education and explanation on how to use Dyspnea scale       Expected Outcomes  Short Term: Able to use Dyspnea scale daily in rehab to express subjective sense of shortness of breath  during exertion;Long Term: Able to use Dyspnea scale to guide intensity level when exercising independently       Knowledge and  understanding of Target Heart Rate Range (THRR)  Yes       Intervention  Provide education and explanation of THRR including how the numbers were predicted and where they are located for reference       Expected Outcomes  Short Term: Able to state/look up THRR;Short Term: Able to use daily as guideline for intensity in rehab;Long Term: Able to use THRR to govern intensity when exercising independently       Able to check pulse independently  Yes       Intervention  Provide education and demonstration on how to check pulse in carotid and radial arteries.;Review the importance of being able to check your own pulse for safety during independent exercise       Expected Outcomes  Short Term: Able to explain why pulse checking is important during independent exercise;Long Term: Able to check pulse independently and accurately       Understanding of Exercise Prescription  Yes       Intervention  Provide education, explanation, and written materials on patient's individual exercise prescription       Expected Outcomes  Short Term: Able to explain program exercise prescription;Long Term: Able to explain home exercise prescription to exercise independently          Nutrition & Weight - Outcomes: Pre Biometrics - 06/17/17 1629      Pre Biometrics   Height  5' 3.5" (1.613 m)    Weight  263 lb 3.2 oz (119.4 kg)    Waist Circumference  46 inches    Hip Circumference  59.25 inches    Waist to Hip Ratio  0.78 %    BMI (Calculated)  45.89      Post Biometrics - 12/12/17 1218       Post  Biometrics   Height  5' 5.5" (1.664 m)    Weight  270 lb 11.2 oz (122.8 kg)    Waist Circumference  44 inches    Hip Circumference  60.5 inches    Waist to Hip Ratio  0.73 %    BMI (Calculated)  44.35       Nutrition: Nutrition Therapy & Goals - 06/17/17 1520      Personal Nutrition Goals   Comments  weight loss, eating healthier and learning about healthier options.      Intervention Plan   Intervention   Prescribe, educate and counsel regarding individualized specific dietary modifications aiming towards targeted core components such as weight, hypertension, lipid management, diabetes, heart failure and other comorbidities.;Nutrition handout(s) given to patient.    Expected Outcomes  Short Term Goal: Understand basic principles of dietary content, such as calories, fat, sodium, cholesterol and nutrients.;Short Term Goal: A plan has been developed with personal nutrition goals set during dietitian appointment.;Long Term Goal: Adherence to prescribed nutrition plan.       Nutrition Discharge: Nutrition Assessments - 06/17/17 1520      MEDFICTS Scores   Pre Score  62       Education Questionnaire Score: Knowledge Questionnaire Score - 12/01/17 1148      Knowledge Questionnaire Score   Post Score  16       Goals reviewed with patient; copy given to patient.

## 2017-12-12 NOTE — Progress Notes (Signed)
Discharge Progress Report  [name] MRN: [id number] Date of Birth: 02/13/1961 Referring Provider:     Pulmonary Rehab from 06/17/2017 in [facility]  Referring Provider  Tereasa Coop MD       Number of Visits: 35  Reason for Discharge:  Patient reached a stable level of exercise. Patient independent in their exercise. Patient has met program and personal goals.  Smoking History:  Social History   Tobacco Use  Smoking Status Current Every Day Smoker  . Packs/day: 0.25  . Years: 25.00  . Pack years: 6.25  . Types: Cigarettes  Smokeless Tobacco Never Used  Tobacco Comment   given fake cigarette today, she is on wellbutrin and has patches on standby.    Diagnosis:  Pulmonary emphysema, unspecified emphysema type ([place])  ADL [facility]: Pulmonary Assessment Scores    Row Name 06/17/17 1521 12/01/17 1149 12/12/17 1518     ADL [facility]   ADL Phase  Entry  Exit  Exit   SOB Score total  44  27  -   Rest  0  0  -   Walk  2  1  -   Stairs  4  3  -   Bath  2  1  -   Dress  1  1  -   Shop  3  2  -     CAT Score   CAT Score  21  11  -     mMRC Score   mMRC Score  3  -  2      Initial Exercise Prescription: Initial Exercise Prescription - 06/17/17 1600      Date of Initial Exercise RX and Referring Provider   Date  06/17/17    Referring Provider  Tereasa Coop MD      Oxygen   Oxygen  Continuous    Liters  3-4 4L on treadmill      Treadmill   MPH  1.5    Grade  0.5    Minutes  15    METs  2.25      Recumbant Elliptical   Level  1    RPM  50    Minutes  15    METs  2.1      T5 Nustep   Level  1    SPM  80    Minutes  15    METs  2.1      Prescription Details   Frequency (times per week)  3    Duration  Progress to 45 minutes of aerobic exercise without signs/symptoms of physical distress      Intensity   THRR 40-80% of Max Heartrate  106-143    Ratings of Perceived Exertion  11-13    Perceived Dyspnea   0-4      Progression   Progression  Continue to progress workloads to maintain intensity without signs/symptoms of physical distress.      Resistance Training   Training Prescription  Yes    Weight  3 lbs    Reps  10-15       Discharge Exercise Prescription (Final Exercise Prescription Changes): Exercise Prescription Changes - 12/09/17 1500      Response to Exercise   Blood Pressure (Admit)  130/68    Blood Pressure (Exercise)  138/64    Blood Pressure (Exit)  120/60    Heart Rate (Admit)  82 bpm    Heart Rate (Exercise)  102  bpm    Heart Rate (Exit)  81 bpm    Oxygen Saturation (Admit)  94 %    Oxygen Saturation (Exercise)  90 %    Oxygen Saturation (Exit)  95 %    Rating of Perceived Exertion (Exercise)  15    Perceived Dyspnea (Exercise)  4    Symptoms  none    Duration  Continue with 45 min of aerobic exercise without signs/symptoms of physical distress.    Intensity  THRR unchanged      Progression   Progression  Continue to progress workloads to maintain intensity without signs/symptoms of physical distress.    Average METs  2.25      Resistance Training   Training Prescription  Yes    Weight  4 lbs    Reps  10-15      Interval Training   Interval Training  No      Oxygen   Oxygen  Continuous    Liters  2      Treadmill   MPH  1.8    Grade  0.5    Minutes  15    METs  2.5      Recumbant Elliptical   Level  4    Minutes  15    METs  2.3      T5 Nustep   Level  4    Minutes  15    METs  2      Home Exercise Plan   Plans to continue exercise at  Home (comment) gym in complex, treadmill, stairmill, walking    Frequency  Add 2 additional days to program exercise sessions.    Initial Home Exercises Provided  07/18/17       Functional Capacity: 6 Minute Walk    Row Name 06/17/17 1620 12/12/17 1216       6 Minute Walk   Phase  Initial  Discharge    Distance  894 feet  800 feet    Distance % Change  -  -10.5 %    Distance Feet Change  -   -94 ft    Walk Time  6 minutes  6 minutes    # of Rest Breaks  0  0    MPH  1.69  1.52    METS  2.17  1.49    RPE  13  13    Perceived Dyspnea   2  2    VO2 Peak  7.6  5.22    Symptoms  Yes (comment)  No    Comments  SOB, hip pain 5-6/10, back pain 3/10  -    Resting HR  69 bpm  79 bpm    Resting BP  136/74  140/62    Resting Oxygen Saturation   93 % 88% on Room Air  98 %    Exercise Oxygen Saturation  during 6 min walk  88 %  93 %    Max Ex. HR  127 bpm  92 bpm    Max Ex. BP  146/84  136/62    2 Minute Post BP  134/76  134/64      Interval HR   1 Minute HR  86  91    2 Minute HR  111  90    3 Minute HR  107  92    4 Minute HR  127  90    5 Minute HR  108  90    6 Minute HR  112  92    2 Minute Post HR  75  78    Interval Heart Rate?  Yes  Yes      Interval Oxygen   Interval Oxygen?  Yes  Yes    Baseline Oxygen Saturation %  93 %  98 %    1 Minute Oxygen Saturation %  89 %  95 %    1 Minute Liters of Oxygen  3 L  3 L    2 Minute Oxygen Saturation %  88 %  93 %    2 Minute Liters of Oxygen  3 L  3 L    3 Minute Oxygen Saturation %  89 %  94 %    3 Minute Liters of Oxygen  3 L  3 L    4 Minute Oxygen Saturation %  88 %  95 %    4 Minute Liters of Oxygen  3 L  3 L    5 Minute Oxygen Saturation %  89 %  95 %    5 Minute Liters of Oxygen  3 L  3 L    6 Minute Oxygen Saturation %  88 %  96 %    6 Minute Liters of Oxygen  3 L  3 L    2 Minute Post Oxygen Saturation %  96 %  99 %    2 Minute Post Liters of Oxygen  3 L  3 L       Psychological, QOL, Others - Outcomes: PHQ 2/9: Depression screen [facility] 2/9 12/01/2017 06/17/2017  Decreased Interest 1 1  Down, Depressed, Hopeless 0 0  PHQ - 2 Score 1 1  Altered sleeping 0 0  Tired, decreased energy 2 2  Change in appetite 1 1  Feeling bad or failure about yourself  1 2  Trouble concentrating 0 0  Moving slowly or fidgety/restless 0 0  Suicidal thoughts 0 0  PHQ-9 Score 5 6  Difficult doing work/chores Not difficult at all  Somewhat difficult    Quality of Life:   Personal Goals: Goals established at orientation with interventions provided to work toward goal. Personal Goals and Risk Factors at Admission - 06/17/17 1526      Core Components/Risk Factors/Patient Goals on Admission    Weight Management  Yes;Obesity;Weight Loss    Intervention  Weight Management: Develop a combined nutrition and exercise program designed to reach desired caloric intake, while maintaining appropriate intake of nutrient and fiber, sodium and fats, and appropriate energy expenditure required for the weight goal.;Weight Management: Provide education and appropriate resources to help participant work on and attain dietary goals.;Weight Management/Obesity: Establish reasonable short term and long term weight goals.;Obesity: Provide education and appropriate resources to help participant work on and attain dietary goals.    Admit Weight  263 lb 3.2 oz (119.4 kg)    Goal Weight: Short Term  258 lb (117 kg)    Goal Weight: Long Term  160 lb (72.6 kg)    Expected Outcomes  Short Term: Continue to assess and modify interventions until short term weight is achieved;Long Term: Adherence to nutrition and physical activity/exercise program aimed toward attainment of established weight goal;Weight Maintenance: Understanding of the daily nutrition guidelines, which includes 25-35% calories from fat, 7% or less cal from saturated fats, less than '200mg'$  cholesterol, less than 1.5gm of sodium, & 5 or more servings of fruits and vegetables daily;Weight Loss: Understanding of general recommendations for a balanced deficit meal plan, which promotes 1-2  lb weight loss per week and includes a negative energy balance of [phone] kcal/d;Understanding recommendations for meals to include 15-35% energy as protein, 25-35% energy from fat, 35-60% energy from carbohydrates, less than '200mg'$  of dietary cholesterol, 20-35 gm of total fiber daily;Understanding of distribution  of calorie intake throughout the day with the consumption of 4-5 meals/snacks    Tobacco Cessation  Yes    Number of packs per day  4 cigarettes a day    Intervention  Assist the participant in steps to quit. Provide individualized education and counseling about committing to Tobacco Cessation, relapse prevention, and pharmacological support that can be provided by physician.;Advice worker, assist with locating and accessing local/national Quit Smoking programs, and support quit date choice.    Expected Outcomes  Short Term: Will demonstrate readiness to quit, by selecting a quit date.;Long Term: Complete abstinence from all tobacco products for at least 12 months from quit date.;Short Term: Will quit all tobacco product use, adhering to prevention of relapse plan.    Improve shortness of breath with ADL's  Yes    Intervention  Provide education, individualized exercise plan and daily activity instruction to help decrease symptoms of SOB with activities of daily living.    Expected Outcomes  Short Term: Improve cardiorespiratory fitness to achieve a reduction of symptoms when performing ADLs;Long Term: Be able to perform more ADLs without symptoms or delay the onset of symptoms    Hypertension  Yes    Intervention  Provide education on lifestyle modifcations including regular physical activity/exercise, weight management, moderate sodium restriction and increased consumption of fresh fruit, vegetables, and low fat dairy, alcohol moderation, and smoking cessation.;Monitor prescription use compliance.    Expected Outcomes  Short Term: Continued assessment and intervention until BP is < 140/76m HG in hypertensive participants. < 130/846mHG in hypertensive participants with diabetes, heart failure or chronic kidney disease.;Long Term: Maintenance of blood pressure at goal levels.        Personal Goals Discharge: Goals and Risk Factor Review    Row Name 07/14/17 1152 07/14/17 1247  07/25/17 1149 09/10/17 1221 10/08/17 1140     Core Components/Risk Factors/Patient Goals Review   Personal Goals Review  Tobacco Cessation;Improve shortness of breath with ADL's;Weight Management/Obesity  Tobacco Cessation  Weight Management/Obesity;Improve shortness of breath with ADL's;Tobacco Cessation  Tobacco Cessation;Weight Management/Obesity;Hypertension;Improve shortness of breath with ADL's  -   Review  RoTniyahtates she is changing her diet to eat healthy - reducing portion sizes, especially sweets.  They are eating at home more often.  She is down to 3 cigarettes per day - one after each meal.  She state she has more energy since beginning to exercise.  She is checking her O2 and it was better today.  She is now taking wellbutrin.  Also gave her a fake cigarette to use to help stop smoking.  She also plans to use the patches if needed.   RoKadees down 2 lb with the motivation to keep losing weight. Her shortness of breath has decreased since starting LW and she has noticed an improvement in her stamina. She is up to 5 cigs yesterday, but felt guilty about it and getting back to three a day and then quitting. She is very motivated to quit.   RoCountesss down to 162 (admit [phone] She is really wanting to lose more, but has had some set backs recently with a growth having to be cut off of her leg and issues with the stitches.  She is wanting to work hard these next few weeks in the program to lose weight and work on her SOB. She can tell a huge difference in her breathing, especially after how it became worse with her being out of [facility]. She is looking forward to it improving again. With her extra time at home, she picked back up smoking 5-6 cigs a day, but is wanting to try the patch.   Zula returned after a month long absence due to her leg procedure. Hopefully plans to attend regularly.    Expected Outcomes  Short - Harjot will schedule and meet with RD and continue to eat healthy  Long - Euva  will reach first weight goal and continue to eat healthy  Short: Continue to work on quitting and use new fake cigarette.  Long: Continue to work towards cessation.   Short: continue on working on quitting smoking, come to [place] to work on SOB, stamina, and weight goals Long: continue to work on cessation and work on losing weight to get to her end goal of 160 lb.   Short: Meshelle will continue to lose weight and improve SOB by attending [facility] and decrease smoking in a timely manner. Long: Nathan will graduate [facility] and quit smoking.   -   Row Name 11/05/17 1215 11/28/17 1216           Core Components/Risk Factors/Patient Goals Review   Personal Goals Review  Tobacco Cessation;Weight Management/Obesity;Hypertension;Improve shortness of breath with ADL's  Tobacco Cessation;Weight Management/Obesity;Hypertension;Improve shortness of breath with ADL's      Review  Greta has not decresed her smoking intake. She is trying to quit but cant find the time to go to a smoking cessation class. Her blood pressure has been doing better than when she started.  Aneyah is taking her blood pressure more and keeping a closer eye on it.  She is down to four cigarettes a day. We are going to set her quit date for her walk test day next Wednesday!!  She wants to be done!!  Her weight is up some from being out and not moving as much.  But now she is back to being able to move and will start losing again.  She is doing better with her breathing overall.       Expected Outcomes  Short: work on decreasing her smoking intake. Long: quit smoking.  Short: Successful meet quit date to quit smoking!!   Long: Continue to work on weight loss!!         Exercise Goals and Review: Exercise Goals    Row Name 06/17/17 1628             Exercise Goals   Increase Physical Activity  Yes       Intervention  Provide advice, education, support and counseling about physical activity/exercise needs.;Develop an individualized exercise  prescription for aerobic and resistive training based on initial evaluation findings, risk stratification, comorbidities and participant's personal goals.       Expected Outcomes  Long Term: Add in home exercise to make exercise part of routine and to increase amount of physical activity.;Short Term: Attend rehab on a regular basis to increase amount of physical activity.;Long Term: Exercising regularly at least 3-5 days a week.       Increase Strength and Stamina  Yes       Intervention  Provide advice, education, support and counseling about physical activity/exercise needs.;Develop an individualized exercise prescription for aerobic and resistive training based on  initial evaluation findings, risk stratification, comorbidities and participant's personal goals.       Expected Outcomes  Short Term: Increase workloads from initial exercise prescription for resistance, speed, and METs.;Short Term: Perform resistance training exercises routinely during rehab and add in resistance training at home;Long Term: Improve cardiorespiratory fitness, muscular endurance and strength as measured by increased METs and functional capacity (6MWT)       Able to understand and use rate of perceived exertion (RPE) scale  Yes       Intervention  Provide education and explanation on how to use RPE scale       Expected Outcomes  Short Term: Able to use RPE daily in rehab to express subjective intensity level;Long Term:  Able to use RPE to guide intensity level when exercising independently       Able to understand and use Dyspnea scale  Yes       Intervention  Provide education and explanation on how to use Dyspnea scale       Expected Outcomes  Short Term: Able to use Dyspnea scale daily in rehab to express subjective sense of shortness of breath during exertion;Long Term: Able to use Dyspnea scale to guide intensity level when exercising independently       Knowledge and understanding of Target Heart Rate Range (THRR)  Yes        Intervention  Provide education and explanation of THRR including how the numbers were predicted and where they are located for reference       Expected Outcomes  Short Term: Able to state/look up THRR;Short Term: Able to use daily as guideline for intensity in rehab;Long Term: Able to use THRR to govern intensity when exercising independently       Able to check pulse independently  Yes       Intervention  Provide education and demonstration on how to check pulse in carotid and radial arteries.;Review the importance of being able to check your own pulse for safety during independent exercise       Expected Outcomes  Short Term: Able to explain why pulse checking is important during independent exercise;Long Term: Able to check pulse independently and accurately       Understanding of Exercise Prescription  Yes       Intervention  Provide education, explanation, and written materials on patient's individual exercise prescription       Expected Outcomes  Short Term: Able to explain program exercise prescription;Long Term: Able to explain home exercise prescription to exercise independently          Nutrition & Weight - Outcomes: Pre Biometrics - 06/17/17 1629      Pre Biometrics   Height  5' 3.5" (1.613 m)    Weight  263 lb 3.2 oz (119.4 kg)    Waist Circumference  46 inches    Hip Circumference  59.25 inches    Waist to Hip Ratio  0.78 %    BMI (Calculated)  45.89      Post Biometrics - 12/12/17 1218       Post  Biometrics   Height  5' 5.5" (1.664 m)    Weight  270 lb 11.2 oz (122.8 kg)    Waist Circumference  44 inches    Hip Circumference  60.5 inches    Waist to Hip Ratio  0.73 %    BMI (Calculated)  44.35       Nutrition: Nutrition Therapy & Goals - 06/17/17 1520  Personal Nutrition Goals   Comments  weight loss, eating healthier and learning about healthier options.      Intervention Plan   Intervention  Prescribe, educate and counsel regarding individualized  specific dietary modifications aiming towards targeted core components such as weight, hypertension, lipid management, diabetes, heart failure and other comorbidities.;Nutrition handout(s) given to patient.    Expected Outcomes  Short Term Goal: Understand basic principles of dietary content, such as calories, fat, sodium, cholesterol and nutrients.;Short Term Goal: A plan has been developed with personal nutrition goals set during dietitian appointment.;Long Term Goal: Adherence to prescribed nutrition plan.       Nutrition Discharge: Nutrition Assessments - 06/17/17 1520      MEDFICTS Scores   Pre Score  62       Education Questionnaire Score: Knowledge Questionnaire Score - 12/01/17 1148      Knowledge Questionnaire Score   Post Score  16       Goals reviewed with patient; copy given to patient.

## 2017-12-12 NOTE — Progress Notes (Addendum)
Pulmonary Individual Treatment Plan  Patient Details  Name: Lakina Mcintire MRN: [id number] Date of Birth: 1960/08/31 Referring Provider:     Pulmonary Rehab from 06/17/2017 in [facility]  Referring Provider  Tereasa Coop MD      Initial Encounter Date:    Pulmonary Rehab from 06/17/2017 in [facility]  Date  06/17/17      Visit Diagnosis: Pulmonary emphysema, unspecified emphysema type ([place])  Patient's Home Medications on Admission:  Current Outpatient Medications:  .  albuterol (PROVENTIL HFA;VENTOLIN HFA) 108 (90 Base) MCG/ACT inhaler, Inhale 2 puffs into the lungs every 6 (six) hours as needed for wheezing or shortness of breath., Disp: , Rfl:  .  budesonide-formoterol (SYMBICORT) 160-4.5 MCG/ACT inhaler, Inhale 2 puffs into the lungs 2 (two) times daily., Disp: , Rfl:  .  buprenorphine-naloxone (SUBOXONE) 8-2 MG SUBL SL tablet, Place 1 tablet under the tongue 2 (two) times daily., Disp: , Rfl:  .  citalopram (CELEXA) 40 MG tablet, Take 40 mg by mouth daily., Disp: , Rfl:  .  estradiol (VIVELLE-DOT) 0.0375 MG/24HR, Place 1 patch onto the skin 2 (two) times a week., Disp: , Rfl:  .  fexofenadine (ALLEGRA) 180 MG tablet, Take 180 mg by mouth daily as needed for allergies or rhinitis., Disp: , Rfl:  .  hydrochlorothiazide (HYDRODIURIL) 25 MG tablet, Take 25 mg by mouth daily., Disp: , Rfl:  .  hydrOXYzine (ATARAX/VISTARIL) 25 MG tablet, Take 25 mg by mouth daily as needed for itching., Disp: , Rfl:  .  ibuprofen (ADVIL,MOTRIN) 800 MG tablet, Take 800 mg by mouth 2 (two) times daily as needed., Disp: , Rfl:  .  lamoTRIgine (LAMICTAL) 25 MG tablet, Take 25 mg by mouth 2 (two) times daily., Disp: , Rfl:  .  lisinopril (PRINIVIL,ZESTRIL) 40 MG tablet, Take 40 mg by mouth daily., Disp: , Rfl:  .  LORazepam (ATIVAN) 1 MG tablet, Take 1 mg by mouth 2 (two) times daily as needed for anxiety., Disp: , Rfl:  .  omeprazole (PRILOSEC) 20 MG capsule, Take 20  mg by mouth daily., Disp: , Rfl:  .  progesterone (PROMETRIUM) 100 MG capsule, Take 100 mg by mouth at bedtime., Disp: , Rfl:  .  Secukinumab (COSENTYX 300 DOSE) 150 MG/ML SOSY, Inject into the skin every 30 (thirty) days. Last dose 04/02/16, Disp: , Rfl:  .  spironolactone (ALDACTONE) 25 MG tablet, Take 25 mg by mouth daily., Disp: , Rfl:   Past Medical History: Past Medical History:  Diagnosis Date  . Asthma   . COPD (chronic obstructive pulmonary disease) ([place])   . Degenerative disc disease, cervical   . Depression   . GERD (gastroesophageal reflux disease)   . Hypertension   . Osteoarthritis   . Psoriatic arthritis ([place])   . Wears contact lenses    right eye    Tobacco Use: Social History   Tobacco Use  Smoking Status Current Every Day Smoker  . Packs/day: 0.25  . Years: 25.00  . Pack years: 6.25  . Types: Cigarettes  Smokeless Tobacco Never Used  Tobacco Comment   given fake cigarette today, she is on wellbutrin and has patches on standby.    Labs: Recent Review Flowsheet Data    There is no flowsheet data to display.       Pulmonary Assessment Scores: Pulmonary Assessment Scores    Row Name 06/17/17 1521 12/01/17 1149 12/12/17 1518     ADL UCSD   ADL Phase  Entry  Exit  Exit   SOB Score total  44  27  -   Rest  0  0  -   Walk  2  1  -   Stairs  4  3  -   Bath  2  1  -   Dress  1  1  -   Shop  3  2  -     CAT Score   CAT Score  21  11  -     mMRC Score   mMRC Score  3  -  2      Pulmonary Function Assessment: Pulmonary Function Assessment - 06/17/17 1522      Breath   Bilateral Breath Sounds  Wheezes    Shortness of Breath  Yes;Fear of Shortness of Breath;Limiting activity       Exercise Target Goals:    Exercise Program Goal: Individual exercise prescription set using results from initial 6 min walk test and THRR while considering  patient's activity barriers and safety.    Exercise Prescription Goal: Initial exercise prescription  builds to 30-45 minutes a day of aerobic activity, 2-3 days per week.  Home exercise guidelines will be given to patient during program as part of exercise prescription that the participant will acknowledge.  Activity Barriers & Risk Stratification: Activity Barriers & Cardiac Risk Stratification - 06/17/17 1623      Activity Barriers & Cardiac Risk Stratification   Activity Barriers  Arthritis;Joint Problems;History of Falls;Muscular Weakness;Deconditioning;Shortness of Breath soriatic arthritis, knee pain       6 Minute Walk: 6 Minute Walk    Row Name 06/17/17 1620 12/12/17 1216       6 Minute Walk   Phase  Initial  Discharge    Distance  894 feet  800 feet    Distance % Change  -  -10.5 %    Distance Feet Change  -  -94 ft    Walk Time  6 minutes  6 minutes    # of Rest Breaks  0  0    MPH  1.69  1.52    METS  2.17  1.49    RPE  13  13    Perceived Dyspnea   2  2    VO2 Peak  7.6  5.22    Symptoms  Yes (comment)  No    Comments  SOB, hip pain 5-6/10, back pain 3/10  -    Resting HR  69 bpm  79 bpm    Resting BP  136/74  140/62    Resting Oxygen Saturation   93 % 88% on Room Air  98 %    Exercise Oxygen Saturation  during 6 min walk  88 %  93 %    Max Ex. HR  127 bpm  92 bpm    Max Ex. BP  146/84  136/62    2 Minute Post BP  134/76  134/64      Interval HR   1 Minute HR  86  91    2 Minute HR  111  90    3 Minute HR  107  92    4 Minute HR  127  90    5 Minute HR  108  90    6 Minute HR  112  92    2 Minute Post HR  75  78    Interval Heart Rate?  Yes  Yes  Interval Oxygen   Interval Oxygen?  Yes  Yes    Baseline Oxygen Saturation %  93 %  98 %    1 Minute Oxygen Saturation %  89 %  95 %    1 Minute Liters of Oxygen  3 L  3 L    2 Minute Oxygen Saturation %  88 %  93 %    2 Minute Liters of Oxygen  3 L  3 L    3 Minute Oxygen Saturation %  89 %  94 %    3 Minute Liters of Oxygen  3 L  3 L    4 Minute Oxygen Saturation %  88 %  95 %    4 Minute Liters of  Oxygen  3 L  3 L    5 Minute Oxygen Saturation %  89 %  95 %    5 Minute Liters of Oxygen  3 L  3 L    6 Minute Oxygen Saturation %  88 %  96 %    6 Minute Liters of Oxygen  3 L  3 L    2 Minute Post Oxygen Saturation %  96 %  99 %    2 Minute Post Liters of Oxygen  3 L  3 L      Oxygen Initial Assessment: Oxygen Initial Assessment - 06/17/17 1523      Home Oxygen   Home Oxygen Device  E-Tanks she is just now getting home oxygen and needs to be evaluated    Sleep Oxygen Prescription  None    Home Exercise Oxygen Prescription  None    Home at Rest Exercise Oxygen Prescription  None      Initial 6 min Walk   Oxygen Used  Continuous;E-Tanks    Liters per minute  3      Program Oxygen Prescription   Program Oxygen Prescription  Continuous;E-Tanks    Liters per minute  3    Comments  patient is getting home oxygen and is suppose to use 3 liters.       Intervention   Short Term Goals  To learn and exhibit compliance with exercise, home and travel O2 prescription;To learn and understand importance of maintaining oxygen saturations>88%;To learn and demonstrate proper use of respiratory medications;To learn and demonstrate proper pursed lip breathing techniques or other breathing techniques.;To learn and understand importance of monitoring SPO2 with pulse oximeter and demonstrate accurate use of the pulse oximeter. inhaler at home. Albuterol prn    Long  Term Goals  Exhibits compliance with exercise, home and travel O2 prescription;Verbalizes importance of monitoring SPO2 with pulse oximeter and return demonstration;Maintenance of O2 saturations>88%;Exhibits proper breathing techniques, such as pursed lip breathing or other method taught during program session;Compliance with respiratory medication;Demonstrates proper use of MDI's       Oxygen Re-Evaluation: Oxygen Re-Evaluation    Row Name 06/25/17 1125 07/25/17 1158 09/10/17 1235 11/05/17 1212 11/28/17 1210     Program Oxygen  Prescription   Program Oxygen Prescription  Continuous;E-Tanks  Continuous;E-Tanks  Continuous;E-Tanks  Continuous;E-Tanks  Continuous;E-Tanks   Liters per minute  '3  3  3  3  3   '$ Comments  patient is getting home oxygen and is suppose to use 3 liters.   -  patient is getting home oxygen and is suppose to use 3 liters.   -  -     Home Oxygen   Home Oxygen Device  E-Tanks  E-Tanks  E-Tanks  E-Tanks  E-Tanks;Home Concentrator   Sleep Oxygen Prescription  -  None  None  Continuous  Continuous   Liters per minute  -  -  -  4  4   Home Exercise Oxygen Prescription  None  None  None  None  None   Home at Rest Exercise Oxygen Prescription  None  None  None  -  None   Compliance with Home Oxygen Use  Yes  Yes  Yes  No  No     Goals/Expected Outcomes   Short Term Goals  To learn and exhibit compliance with exercise, home and travel O2 prescription;To learn and understand importance of maintaining oxygen saturations>88%;To learn and demonstrate proper use of respiratory medications;To learn and demonstrate proper pursed lip breathing techniques or other breathing techniques.;To learn and understand importance of monitoring SPO2 with pulse oximeter and demonstrate accurate use of the pulse oximeter.  To learn and exhibit compliance with exercise, home and travel O2 prescription;To learn and understand importance of maintaining oxygen saturations>88%;To learn and demonstrate proper use of respiratory medications;To learn and demonstrate proper pursed lip breathing techniques or other breathing techniques.;To learn and understand importance of monitoring SPO2 with pulse oximeter and demonstrate accurate use of the pulse oximeter.  To learn and exhibit compliance with exercise, home and travel O2 prescription;To learn and understand importance of maintaining oxygen saturations>88%;To learn and demonstrate proper use of respiratory medications;To learn and demonstrate proper pursed lip breathing techniques or other  breathing techniques.;To learn and understand importance of monitoring SPO2 with pulse oximeter and demonstrate accurate use of the pulse oximeter.  To learn and exhibit compliance with exercise, home and travel O2 prescription;To learn and understand importance of maintaining oxygen saturations>88%;To learn and demonstrate proper use of respiratory medications;To learn and demonstrate proper pursed lip breathing techniques or other breathing techniques.;To learn and understand importance of monitoring SPO2 with pulse oximeter and demonstrate accurate use of the pulse oximeter.  To learn and exhibit compliance with exercise, home and travel O2 prescription;To learn and understand importance of maintaining oxygen saturations>88%;To learn and demonstrate proper use of respiratory medications;To learn and demonstrate proper pursed lip breathing techniques or other breathing techniques.;To learn and understand importance of monitoring SPO2 with pulse oximeter and demonstrate accurate use of the pulse oximeter.   Long  Term Goals  Exhibits compliance with exercise, home and travel O2 prescription;Verbalizes importance of monitoring SPO2 with pulse oximeter and return demonstration;Maintenance of O2 saturations>88%;Exhibits proper breathing techniques, such as pursed lip breathing or other method taught during program session;Compliance with respiratory medication;Demonstrates proper use of MDI's  Exhibits compliance with exercise, home and travel O2 prescription;Verbalizes importance of monitoring SPO2 with pulse oximeter and return demonstration;Maintenance of O2 saturations>88%;Exhibits proper breathing techniques, such as pursed lip breathing or other method taught during program session;Compliance with respiratory medication;Demonstrates proper use of MDI's  Exhibits compliance with exercise, home and travel O2 prescription;Verbalizes importance of monitoring SPO2 with pulse oximeter and return  demonstration;Maintenance of O2 saturations>88%;Exhibits proper breathing techniques, such as pursed lip breathing or other method taught during program session;Compliance with respiratory medication;Demonstrates proper use of MDI's  Exhibits compliance with exercise, home and travel O2 prescription;Verbalizes importance of monitoring SPO2 with pulse oximeter and return demonstration;Maintenance of O2 saturations>88%;Exhibits proper breathing techniques, such as pursed lip breathing or other method taught during program session;Compliance with respiratory medication;Demonstrates proper use of MDI's  Exhibits compliance with exercise, home and travel O2 prescription;Verbalizes importance of monitoring SPO2 with pulse oximeter and return demonstration;Maintenance of O2 saturations>88%;Exhibits  proper breathing techniques, such as pursed lip breathing or other method taught during program session;Compliance with respiratory medication;Demonstrates proper use of MDI's   Comments  Reviewed PLB technique with pt.  Talked about how it work and it's important to maintaining his exercise saturations.    Arwen is using her oxygen correctly and managing it well. She is also continuing to use PLB here and outside of LW. Her medications are being used properly.   Laryn is continuing to use her oxygen appropriately. She feels more confident in managing it independently. She is practicing PLB when needed. She reports not having to use her rescue inhaler as much, and that her new daily inhaler is working well  Shelvie has been imrving with her breathing since she started in the program. Steps are still a big issue with her shortness of breath. She has been working on PLB at home and has been doing well at it. She is not checking her oxygen at home that much she states, Informed her to check her oxygen at home if she is not going to wear any.  Tyniesha is going to start using her oxygen more at home and out and about.  She is going to  call her company to see if she can get a smaller portable system.  She has been more compliant.  She is feeling better overall and using her PLB more.  She has started to use her pulse oximeter more.    Goals/Expected Outcomes  Short: Become more profiecient at using PLB.   Long: Become independent at using PLB.  Short: continue to work on PLB. Long: continue to manage oxygen and respiratory medication.   Short: continue to work on PLB and use oxygen appropirately. Long: become indpenedent with PLB   Short: check oxygen at home more oftern. Long: maintain checking oxygen at home independently.  Short: Contact oxygen company for smaller portable tank.  Long Continue to check oxygen levels more.       Oxygen Discharge (Final Oxygen Re-Evaluation): Oxygen Re-Evaluation - 11/28/17 1210      Program Oxygen Prescription   Program Oxygen Prescription  Continuous;E-Tanks    Liters per minute  3      Home Oxygen   Home Oxygen Device  E-Tanks;Home Concentrator    Sleep Oxygen Prescription  Continuous    Liters per minute  4    Home Exercise Oxygen Prescription  None    Home at Rest Exercise Oxygen Prescription  None    Compliance with Home Oxygen Use  No      Goals/Expected Outcomes   Short Term Goals  To learn and exhibit compliance with exercise, home and travel O2 prescription;To learn and understand importance of maintaining oxygen saturations>88%;To learn and demonstrate proper use of respiratory medications;To learn and demonstrate proper pursed lip breathing techniques or other breathing techniques.;To learn and understand importance of monitoring SPO2 with pulse oximeter and demonstrate accurate use of the pulse oximeter.    Long  Term Goals  Exhibits compliance with exercise, home and travel O2 prescription;Verbalizes importance of monitoring SPO2 with pulse oximeter and return demonstration;Maintenance of O2 saturations>88%;Exhibits proper breathing techniques, such as pursed lip breathing or  other method taught during program session;Compliance with respiratory medication;Demonstrates proper use of MDI's    Comments  Samyuktha is going to start using her oxygen more at home and out and about.  She is going to call her company to see if she can get a smaller portable system.  She  has been more compliant.  She is feeling better overall and using her PLB more.  She has started to use her pulse oximeter more.     Goals/Expected Outcomes  Short: Contact oxygen company for smaller portable tank.  Long Continue to check oxygen levels more.        Initial Exercise Prescription: Initial Exercise Prescription - 06/17/17 1600      Date of Initial Exercise RX and Referring Provider   Date  06/17/17    Referring Provider  Tereasa Coop MD      Oxygen   Oxygen  Continuous    Liters  3-4 4L on treadmill      Treadmill   MPH  1.5    Grade  0.5    Minutes  15    METs  2.25      Recumbant Elliptical   Level  1    RPM  50    Minutes  15    METs  2.1      T5 Nustep   Level  1    SPM  80    Minutes  15    METs  2.1      Prescription Details   Frequency (times per week)  3    Duration  Progress to 45 minutes of aerobic exercise without signs/symptoms of physical distress      Intensity   THRR 40-80% of Max Heartrate  106-143    Ratings of Perceived Exertion  11-13    Perceived Dyspnea  0-4      Progression   Progression  Continue to progress workloads to maintain intensity without signs/symptoms of physical distress.      Resistance Training   Training Prescription  Yes    Weight  3 lbs    Reps  10-15       Perform Capillary Blood Glucose checks as needed.  Exercise Prescription Changes:  Exercise Prescription Changes    Row Name 06/17/17 1600 07/07/17 1600 07/18/17 1200 07/22/17 1400 08/05/17 1500     Response to Exercise   Blood Pressure (Admit)  136/74  130/70  -  124/76  124/80   Blood Pressure (Exercise)  146/84  -  -  -  -   Blood Pressure (Exit)  134/76   128/78  -  130/80  116/74   Heart Rate (Admit)  69 bpm  81 bpm  -  80 bpm  80 bpm   Heart Rate (Exercise)  127 bpm  96 bpm  -  89 bpm  85 bpm   Heart Rate (Exit)  75 bpm  73 bpm  -  72 bpm  71 bpm   Oxygen Saturation (Admit)  93 %  88 %  -  92 %  91 %   Oxygen Saturation (Exercise)  99 %  88 %  -  91 %  91 %   Oxygen Saturation (Exit)  96 %  95 %  -  96 %  94 %   Rating of Perceived Exertion (Exercise)  13  13  -  13  13   Perceived Dyspnea (Exercise)  2  1  -  0  0   Symptoms  hip pain 6/10, back pain 3/10, SOB  none  -  none  none   Comments  walk test results  -  -  -  -   Duration  -  Continue with 45 min of aerobic exercise without signs/symptoms of physical distress.  -  Continue with 45 min of aerobic exercise without signs/symptoms of physical distress.  Continue with 45 min of aerobic exercise without signs/symptoms of physical distress.   Intensity  -  THRR unchanged  -  THRR unchanged  THRR unchanged     Progression   Progression  -  Continue to progress workloads to maintain intensity without signs/symptoms of physical distress.  -  Continue to progress workloads to maintain intensity without signs/symptoms of physical distress.  Continue to progress workloads to maintain intensity without signs/symptoms of physical distress.   Average METs  -  2.73  -  1.95  2.57     Resistance Training   Training Prescription  -  Yes  -  Yes  Yes   Weight  -  3 lbs  -  3 lbs  4 lbs   Reps  -  10-15  -  10-15  10-15     Interval Training   Interval Training  -  No  -  No  No     Oxygen   Oxygen  -  Continuous  -  Continuous  Continuous   Liters  -  2-3 3L on treadmill  -  3  2     Treadmill   MPH  -  1.5  -  1.5  2   Grade  -  0.5  -  0.5  1   Minutes  -  15  -  15  15   METs  -  2.25  -  2.25  2.81     Recumbant Elliptical   Level  -  1  -  2  2   RPM  -  39  -  33  50   Minutes  -  15  -  15  15   METs  -  1.3  -  1.6  2     T5 Nustep   Level  -  1  -  2  3   SPM  -  100  -   121  97   Minutes  -  15  -  15  15   METs  -  1.9  -  2  2.9     Home Exercise Plan   Plans to continue exercise at  -  -  Home (comment) gym in complex, treadmill, stairmill, walking  Home (comment) gym in complex, treadmill, stairmill, walking  Home (comment) gym in complex, treadmill, stairmill, walking   Frequency  -  -  Add 1 additional day to program exercise sessions.  Add 1 additional day to program exercise sessions.  Add 1 additional day to program exercise sessions.   Initial Home Exercises Provided  -  -  07/18/17  07/18/17  07/18/17   Row Name 08/18/17 1500 09/02/17 1400 09/16/17 1600 10/15/17 1500 10/29/17 1400     Response to Exercise   Blood Pressure (Admit)  130/74  132/82  132/72  124/68  130/80   Blood Pressure (Exit)  122/78  122/76  126/70  116/68  110/70   Heart Rate (Admit)  77 bpm  83 bpm  81 bpm  72 bpm  71 bpm   Heart Rate (Exercise)  89 bpm  97 bpm  97 bpm  94 bpm  92 bpm   Heart Rate (Exit)  93 bpm  73 bpm  86 bpm  71 bpm  72 bpm   Oxygen Saturation (Admit)  94 %  95 %  96 %  97 %  97 %   Oxygen Saturation (Exercise)  89 %  90 %  90 %  92 %  90 %   Oxygen Saturation (Exit)  95 %  96 %  93 %  97 %  97 %   Rating of Perceived Exertion (Exercise)  '13  15  15  15  '$ 130   Perceived Dyspnea (Exercise)  0  '2  1  2  '$ 0   Symptoms  none  none  none  none  none   Duration  Continue with 45 min of aerobic exercise without signs/symptoms of physical distress.  Continue with 45 min of aerobic exercise without signs/symptoms of physical distress.  Continue with 45 min of aerobic exercise without signs/symptoms of physical distress.  Continue with 45 min of aerobic exercise without signs/symptoms of physical distress.  Continue with 45 min of aerobic exercise without signs/symptoms of physical distress.   Intensity  THRR unchanged  THRR unchanged  THRR unchanged  THRR unchanged  THRR unchanged     Progression   Progression  Continue to progress workloads to maintain intensity  without signs/symptoms of physical distress.  Continue to progress workloads to maintain intensity without signs/symptoms of physical distress.  Continue to progress workloads to maintain intensity without signs/symptoms of physical distress.  Continue to progress workloads to maintain intensity without signs/symptoms of physical distress.  Continue to progress workloads to maintain intensity without signs/symptoms of physical distress.   Average METs  2.23  2.46  2.27  2.35  2.27     Resistance Training   Training Prescription  Yes  Yes  Yes  Yes  Yes   Weight  4 lbs  4 lbs  4 lbs  4 lb  4 lbs   Reps  10-15  10-15  10-15  10-15  10-15     Interval Training   Interval Training  No  -  No  No  No     Oxygen   Oxygen  Continuous  Continuous  Continuous  Continuous  Continuous   Liters  '2  2  2  2  2     '$ Treadmill   MPH  1.8  2  1.8  1.8  1.8   Grade  0.5  0.5  0.5  0.5  0.5   Minutes  '15  15  15  15  15   '$ METs  2.5  2.67  2.5  2.5  2.5     Recumbant Elliptical   Level  '3  4  4  4  4   '$ RPM  44  46  41  40  42   Minutes  '15  15  15  15  15   '$ METs  2.1  2.6  2.2  2.2  2.3     T5 Nustep   Level  '3  4  4  '$ -  4   SPM  101  91  91  -  -   Minutes  '15  15  15  '$ -  15   METs  2.1  2.1  2.1  -  2     Home Exercise Plan   Plans to continue exercise at  Home (comment) gym in complex, treadmill, stairmill, walking  Home (comment) gym in complex, treadmill, stairmill, walking  Home (comment) gym in complex, treadmill, stairmill, walking  Home (comment) gym in complex, treadmill, stairmill, walking  Home (comment) gym in complex, treadmill, stairmill,  walking   Frequency  Add 1 additional day to program exercise sessions.  Add 1 additional day to program exercise sessions.  Add 1 additional day to program exercise sessions.  Add 1 additional day to program exercise sessions.  Add 2 additional days to program exercise sessions.   Initial Home Exercises Provided  07/18/17  07/18/17  07/18/17   07/18/17  07/18/17   Row Name 11/11/17 1100 11/25/17 1400 12/09/17 1500         Response to Exercise   Blood Pressure (Admit)  132/64  130/84  130/68     Blood Pressure (Exercise)  -  -  138/64     Blood Pressure (Exit)  126/64  110/62  120/60     Heart Rate (Admit)  83 bpm  81 bpm  82 bpm     Heart Rate (Exercise)  95 bpm  94 bpm  102 bpm     Heart Rate (Exit)  91 bpm  71 bpm  81 bpm     Oxygen Saturation (Admit)  94 %  94 %  94 %     Oxygen Saturation (Exercise)  91 %  91 %  90 %     Oxygen Saturation (Exit)  97 %  95 %  95 %     Rating of Perceived Exertion (Exercise)  '13  13  15     '$ Perceived Dyspnea (Exercise)  '2  1  4     '$ Symptoms  none  none  none     Duration  Continue with 45 min of aerobic exercise without signs/symptoms of physical distress.  Continue with 45 min of aerobic exercise without signs/symptoms of physical distress.  Continue with 45 min of aerobic exercise without signs/symptoms of physical distress.     Intensity  THRR unchanged  THRR unchanged  THRR unchanged       Progression   Progression  Continue to progress workloads to maintain intensity without signs/symptoms of physical distress.  Continue to progress workloads to maintain intensity without signs/symptoms of physical distress.  Continue to progress workloads to maintain intensity without signs/symptoms of physical distress.     Average METs  2.23  2.23  2.25       Resistance Training   Training Prescription  Yes  Yes  Yes     Weight  4 lbs  4 lbs  4 lbs     Reps  10-15  10-15  10-15       Interval Training   Interval Training  No  No  No       Oxygen   Oxygen  Continuous  Continuous  Continuous     Liters  '2  2  2       '$ Treadmill   MPH  1.8  1.8  1.8     Grade  0.5  0.5  0.5     Minutes  '15  15  15     '$ METs  2.5  2.5  2.5       Recumbant Elliptical   Level  '4  4  4     '$ RPM  41  40  -     Minutes  '15  15  15     '$ METs  2.2  2.2  2.3       T5 Nustep   Level  '4  4  4     '$ SPM  90  86  -      Minutes  $'15  15  15     'S$ METs  2.2  2.2  2       Home Exercise Plan   Plans to continue exercise at  Home (comment) gym in complex, treadmill, stairmill, walking  Home (comment) gym in complex, treadmill, stairmill, walking  Home (comment) gym in complex, treadmill, stairmill, walking     Frequency  Add 2 additional days to program exercise sessions.  Add 2 additional days to program exercise sessions.  Add 2 additional days to program exercise sessions.     Initial Home Exercises Provided  07/18/17  07/18/17  07/18/17        Exercise Comments:  Exercise Comments    Row Name 06/25/17 1124 12/03/17 1224 12/12/17 1220       Exercise Comments  First full day of exercise!  Patient was oriented to gym and equipment including functions, settings, policies, and procedures.  Patient's individual exercise prescription and treatment plan were reviewed.  All starting workloads were established based on the results of the 6 minute walk test done at initial orientation visit.  The plan for exercise progression was also introduced and progression will be customized based on patient's performance and goals.  Attempted post 6MWT today.  Pt had a panic attack half way through test and satuarations dropped to 75%.  Test was terminated.  She was able to recover to 95% once seated and calmed.  We will try again on Friday using rolling walker for support for her back. Leshae claimed that as soon as her back started to hurt, she started getting short of breath which induced the panic attack causing her to wheeze.     Chantilly graduated today from  rehab with 35 sessions completed.  Details of the patient's exercise prescription and what She needs to do in order to continue the prescription and progress were discussed with patient.  Patient was given a copy of prescription and goals.  Patient verbalized understanding.  Brenee plans to continue to exercise by joining [employer].        Exercise Goals and  Review:  Exercise Goals    Row Name 06/17/17 1628             Exercise Goals   Increase Physical Activity  Yes       Intervention  Provide advice, education, support and counseling about physical activity/exercise needs.;Develop an individualized exercise prescription for aerobic and resistive training based on initial evaluation findings, risk stratification, comorbidities and participant's personal goals.       Expected Outcomes  Long Term: Add in home exercise to make exercise part of routine and to increase amount of physical activity.;Short Term: Attend rehab on a regular basis to increase amount of physical activity.;Long Term: Exercising regularly at least 3-5 days a week.       Increase Strength and Stamina  Yes       Intervention  Provide advice, education, support and counseling about physical activity/exercise needs.;Develop an individualized exercise prescription for aerobic and resistive training based on initial evaluation findings, risk stratification, comorbidities and participant's personal goals.       Expected Outcomes  Short Term: Increase workloads from initial exercise prescription for resistance, speed, and METs.;Short Term: Perform resistance training exercises routinely during rehab and add in resistance training at home;Long Term: Improve cardiorespiratory fitness, muscular endurance and strength as measured by increased METs and functional capacity (6MWT)       Able to understand and use rate of perceived exertion (  RPE) scale  Yes       Intervention  Provide education and explanation on how to use RPE scale       Expected Outcomes  Short Term: Able to use RPE daily in rehab to express subjective intensity level;Long Term:  Able to use RPE to guide intensity level when exercising independently       Able to understand and use Dyspnea scale  Yes       Intervention  Provide education and explanation on how to use Dyspnea scale       Expected Outcomes  Short Term: Able to  use Dyspnea scale daily in rehab to express subjective sense of shortness of breath during exertion;Long Term: Able to use Dyspnea scale to guide intensity level when exercising independently       Knowledge and understanding of Target Heart Rate Range (THRR)  Yes       Intervention  Provide education and explanation of THRR including how the numbers were predicted and where they are located for reference       Expected Outcomes  Short Term: Able to state/look up THRR;Short Term: Able to use daily as guideline for intensity in rehab;Long Term: Able to use THRR to govern intensity when exercising independently       Able to check pulse independently  Yes       Intervention  Provide education and demonstration on how to check pulse in carotid and radial arteries.;Review the importance of being able to check your own pulse for safety during independent exercise       Expected Outcomes  Short Term: Able to explain why pulse checking is important during independent exercise;Long Term: Able to check pulse independently and accurately       Understanding of Exercise Prescription  Yes       Intervention  Provide education, explanation, and written materials on patient's individual exercise prescription       Expected Outcomes  Short Term: Able to explain program exercise prescription;Long Term: Able to explain home exercise prescription to exercise independently          Exercise Goals Re-Evaluation : Exercise Goals Re-Evaluation    Row Name 06/25/17 1124 07/07/17 1621 07/18/17 1219 07/25/17 1156 08/05/17 1503     Exercise Goal Re-Evaluation   Exercise Goals Review  Understanding of Exercise Prescription;Able to understand and use Dyspnea scale;Knowledge and understanding of Target Heart Rate Range (THRR);Able to understand and use rate of perceived exertion (RPE) scale  Increase Physical Activity;Understanding of Exercise Prescription;Increase Strength and Stamina  Increase Physical Activity;Understanding  of Exercise Prescription;Increase Strength and Stamina;Able to understand and use Dyspnea scale;Knowledge and understanding of Target Heart Rate Range (THRR);Able to understand and use rate of perceived exertion (RPE) scale;Able to check pulse independently  Increase Physical Activity;Understanding of Exercise Prescription;Increase Strength and Stamina;Able to understand and use Dyspnea scale;Knowledge and understanding of Target Heart Rate Range (THRR);Able to understand and use rate of perceived exertion (RPE) scale;Able to check pulse independently  Increase Physical Activity;Understanding of Exercise Prescription;Increase Strength and Stamina   Comments  Reviewed RPE scale, THR and program prescription with pt today.  Pt voiced understanding and was given a copy of goals to take home.   Kassadie has been doing well in rehab.  She is now using 3L on the treadmill to help improve her saturations.  We will continue to monitor her progress.   Reviewed home exercise with pt today.  Pt plans to walk and go to gym in  complex for exercise.  She will have access to a treadmill and stairmill. Reviewed THR, pulse, RPE, sign and symptoms, and when to call 911 or MD.  Also discussed weather considerations and indoor options.  Pt voiced understanding.  Fatisha reviewed what was said during home exercise last week. She said she went to the gym at her complex and plans to go tomorrow to add another day of exercise  Latessa has been doing well in rehab.  She is now up to 2.0 mph on the treadmill and level 3 on the T5 NuStep.  We will continue to monitor her progression.     Expected Outcomes  Short: Use RPE daily to regulate intensity.  Long: Follow program prescription in THR.  Short: Continue to attend regularly.  Long: Continue to work on IT sales professional.   Short: Start going to gym 1 extra day a week.  Long: Exercise more on off days from rehab.   Short: continue to exercise one extra day outside of LW. Long: exercise  independently.   Short: Continue to add in her home exercise at least one extra day a week.  Long: Continue to build strength and stamina.    [place] Name 08/18/17 1536 09/02/17 1423 09/16/17 1610 09/30/17 1456 10/15/17 1514     Exercise Goal Re-Evaluation   Exercise Goals Review  Increase Physical Activity;Understanding of Exercise Prescription;Increase Strength and Stamina  Increase Physical Activity;Understanding of Exercise Prescription;Increase Strength and Stamina  Increase Physical Activity;Understanding of Exercise Prescription;Increase Strength and Stamina  -  Increase Physical Activity;Able to understand and use rate of perceived exertion (RPE) scale;Increase Strength and Stamina;Able to understand and use Dyspnea scale   Comments  Stefanee continues to do well in rehab.  She had done level 4 on T5 NuStep but went back to level 3 and went back down to 1.83mh on the treadmill.  We will talk to her about these at next visit. We will continue to monitor her progression.  RJodeecontinues to do well in rehab.  She had done level 4 on T5 NuStep but went back to level 3 and went back down to 1.847m on the treadmill.  We will talk to her about these at next visit. We will continue to monitor her progression.  RoKimilaas been out for a week after dermatologic surgery.  She has only attended once since last reveiw.  We will continue to monitor her progression.   Out since last review.  Plans to return tomorrow 5/22.  Pt has only attended twice in May.  Regular attendance is required for her to make progress.     Expected Outcomes   Short: Return workloads back to where they should be.  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 Long: Continue to build strength and stamina.   Short: Return to rehab regularly.  Long: Continue to increase physical activity.   -  Short - Pt will attend regularly Long - Pt will increase MET level   Row Name 10/29/17 1457 11/11/17 1131  11/25/17 1454 11/28/17 1209 12/09/17 1459     Exercise Goal Re-Evaluation   Exercise Goals Review  Increase Physical Activity;Increase Strength and Stamina;Understanding of Exercise Prescription  Increase Physical Activity;Increase Strength and Stamina;Understanding of Exercise Prescription  Increase Physical Activity;Increase Strength and Stamina;Understanding of Exercise Prescription  Increase Physical Activity;Increase Strength and Stamina;Understanding of Exercise Prescription  Increase Physical Activity;Increase Strength and Stamina;Understanding of Exercise Prescription   Comments  Ellie has only attended once since last review.  She was able to maintain her workloads.  We will continue to monitor her progress.   Kyoko's attendance has been getting better. She is now up to 1.8 mph on the treadmill.  We will begin to increase her other workloads as well.  We will continue to monitor her progression.   Aiva has been out with health issues. She has only had one session since last review.  We will continue to monitor her progression.   Shaney is nearing graduation.  We will be doing her post 6MWT next week. I fully expect her to improve from the beginning.  She has been exercsing at home and using her oxygen at home.    Airi will re-attempt her post 6MWT on her next visit.  She had a panic attack during her walk.  She wants to improve. She will continue to exercise by walking at home.    Expected Outcomes  Short: Return to regular attendance.  Long: Continue to work on Press photographer.   Short: Increase workloads.  Long: Continue to build strength and stamina.   Short: Continue to increase workloads.  Long: Continue to exercise on off days  Short: Improve post 6MWT and graduate!!  Long: Continue to exercise independently.   Short: Improve post 6MWT and graduate!!  Long: Continue to exercise independently.       Discharge Exercise Prescription (Final Exercise Prescription Changes): Exercise  Prescription Changes - 12/09/17 1500      Response to Exercise   Blood Pressure (Admit)  130/68    Blood Pressure (Exercise)  138/64    Blood Pressure (Exit)  120/60    Heart Rate (Admit)  82 bpm    Heart Rate (Exercise)  102 bpm    Heart Rate (Exit)  81 bpm    Oxygen Saturation (Admit)  94 %    Oxygen Saturation (Exercise)  90 %    Oxygen Saturation (Exit)  95 %    Rating of Perceived Exertion (Exercise)  15    Perceived Dyspnea (Exercise)  4    Symptoms  none    Duration  Continue with 45 min of aerobic exercise without signs/symptoms of physical distress.    Intensity  THRR unchanged      Progression   Progression  Continue to progress workloads to maintain intensity without signs/symptoms of physical distress.    Average METs  2.25      Resistance Training   Training Prescription  Yes    Weight  4 lbs    Reps  10-15      Interval Training   Interval Training  No      Oxygen   Oxygen  Continuous    Liters  2      Treadmill   MPH  1.8    Grade  0.5    Minutes  15    METs  2.5      Recumbant Elliptical   Level  4    Minutes  15    METs  2.3      T5 Nustep   Level  4    Minutes  15    METs  2      Home Exercise Plan   Plans to continue exercise at  Home (comment) gym in complex, treadmill, stairmill, walking    Frequency  Add 2 additional days to program exercise sessions.    Initial Home Exercises Provided  07/18/17  Nutrition:  Target Goals: Understanding of nutrition guidelines, daily intake of sodium '1500mg'$ , cholesterol '200mg'$ , calories 30% from fat and 7% or less from saturated fats, daily to have 5 or more servings of fruits and vegetables.  Biometrics: Pre Biometrics - 06/17/17 1629      Pre Biometrics   Height  5' 3.5" (1.613 m)    Weight  263 lb 3.2 oz (119.4 kg)    Waist Circumference  46 inches    Hip Circumference  59.25 inches    Waist to Hip Ratio  0.78 %    BMI (Calculated)  45.89      Post Biometrics - 12/12/17 1218        Post  Biometrics   Height  5' 5.5" (1.664 m)    Weight  270 lb 11.2 oz (122.8 kg)    Waist Circumference  44 inches    Hip Circumference  60.5 inches    Waist to Hip Ratio  0.73 %    BMI (Calculated)  44.35       Nutrition Therapy Plan and Nutrition Goals: Nutrition Therapy & Goals - 06/17/17 1520      Personal Nutrition Goals   Comments  weight loss, eating healthier and learning about healthier options.      Intervention Plan   Intervention  Prescribe, educate and counsel regarding individualized specific dietary modifications aiming towards targeted core components such as weight, hypertension, lipid management, diabetes, heart failure and other comorbidities.;Nutrition handout(s) given to patient.    Expected Outcomes  Short Term Goal: Understand basic principles of dietary content, such as calories, fat, sodium, cholesterol and nutrients.;Short Term Goal: A plan has been developed with personal nutrition goals set during dietitian appointment.;Long Term Goal: Adherence to prescribed nutrition plan.       Nutrition Assessments: Nutrition Assessments - 06/17/17 1520      MEDFICTS Scores   Pre Score  62       Nutrition Goals Re-Evaluation: Nutrition Goals Re-Evaluation    [place] Name 07/14/17 1157 07/25/17 1153 09/10/17 1228 11/05/17 1219 11/28/17 1224     Goals   Current Weight  264 lb (119.7 kg)  261 lb (118.4 kg)  262 lb (118.8 kg)  268 lb (121.6 kg)  -   Nutrition Goal  -  -  -  Weight loss, learn what nutrition she is getting, Be more consistant with diet.  Weight loss.  Continue to eat healthy.   Comment  Lorena will schedule with RD and folow her recommendations  Beyounce had to miss her initial appointment with the RD due to home issues, but has a second appointment the end of this month. She reported not eating out as much and watching her portion sizes.   Ashtin did not want to reschedule her nutrition appointment, but knows she can change her mind at any point. She reports  that she is being more mindful of food labels and cooking healthier options at home  Jariya states she knows what to do with her diet but she is not consistent. She wants to lose weight.  Ryin usually cooks at home and they try to eat healthy.  She is getting her vegetable and fruits.  Last night she made turnip greens with olive oil versus lard!!  She is getting whole grains and reading food labels.  She is trying to make sure she is getting enough protien in her diet.    Expected Outcome  Short - Annasofia will meet with RD  Long -  Jeanee will continue to lose weight  Short: Clarisa will meet with the dietician. Long: Margrette will work on eating healthier and get to her goal weight.   Short: Phoebe will attend the General Nutrition class in [facility]. Long: Anice will become independent in eaiting a healthy diet and reach her goal weight.   Short: lose 2 pounds by Monday. Long: lose 5 pounds before she graduates [facility].  Short: Continue to work on weight loss and exercise more. Long: Continue to focus on healthy eating.       Nutrition Goals Discharge (Final Nutrition Goals Re-Evaluation): Nutrition Goals Re-Evaluation - 11/28/17 1224      Goals   Nutrition Goal  Weight loss.  Continue to eat healthy.    Comment  Kellis usually cooks at home and they try to eat healthy.  She is getting her vegetable and fruits.  Last night she made turnip greens with olive oil versus lard!!  She is getting whole grains and reading food labels.  She is trying to make sure she is getting enough protien in her diet.     Expected Outcome  Short: Continue to work on weight loss and exercise more. Long: Continue to focus on healthy eating.        Psychosocial: Target Goals: Acknowledge presence or absence of significant depression and/or stress, maximize coping skills, provide positive support system. Participant is able to verbalize types and ability to use techniques and skills needed for reducing stress and depression.    Initial Review & Psychosocial Screening: Initial Psych Review & Screening - 06/17/17 1517      Initial Review   Current issues with  Current Depression;History of Depression;Current Psychotropic Meds;Current Stress Concerns    Source of Stress Concerns  Chronic Illness;Unable to perform yard/household activities;Family    Comments  Her children do not know that she has emphysema.      Family Dynamics   Good Support System?  Yes    Comments  Her boyfriend of 15 years is a great support system for her.      Barriers   Psychosocial barriers to participate in program  Psychosocial barriers identified (see note);The patient should benefit from training in stress management and relaxation.      Screening Interventions   Interventions  Encouraged to exercise;Provide feedback about the scores to participant;Program counselor consult;To provide support and resources with identified psychosocial needs    Expected Outcomes  Short Term goal: Utilizing psychosocial counselor, staff and physician to assist with identification of specific Stressors or current issues interfering with healing process. Setting desired goal for each stressor or current issue identified.;Long Term Goal: Stressors or current issues are controlled or eliminated.;Short Term goal: Identification and review with participant of any Quality of Life or Depression concerns found by scoring the questionnaire.;Long Term goal: The participant improves quality of Life and PHQ9 Scores as seen by post scores and/or verbalization of changes       Quality of Life Scores:  Scores of 19 and below usually indicate a poorer quality of life in these areas.  A difference of  2-3 points is a clinically meaningful difference.  A difference of 2-3 points in the total score of the Quality of Life Index has been associated with significant improvement in overall quality of life, self-image, physical symptoms, and general health in studies assessing  change in quality of life.  PHQ-9: Recent Review Flowsheet Data    Depression screen [facility] 2/9 12/01/2017 06/17/2017   Decreased Interest 1  1   Down, Depressed, Hopeless 0 0   PHQ - 2 Score 1 1   Altered sleeping 0 0   Tired, decreased energy 2 2   Change in appetite 1 1   Feeling bad or failure about yourself  1 2   Trouble concentrating 0 0   Moving slowly or fidgety/restless 0 0   Suicidal thoughts 0 0   PHQ-9 Score 5 6   Difficult doing work/chores Not difficult at all Somewhat difficult     Interpretation of Total Score  Total Score Depression Severity:  1-4 = Minimal depression, 5-9 = Mild depression, 10-14 = Moderate depression, 15-19 = Moderately severe depression, 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 Nyoka Cowden Shirlean Mylar) today for initial psychosocial evaluation.  She is a 57 year old who struggles with COPD.  Nastasha has a strong support system with a significant other of 13 years; and (2) sisters and a brother who are local and involved.  Mahayla reports sleeping "okay" other than being in pain with her arthritis.  Others report she is very "restless" at night and her arms are moving often.  She has had several sleep studies and it has been determined she does not have sleep apnea.  Vercie reports a history of depression and anxiety and has been on medications for this for quite some time.  She reports these are helping and her mood is stable at this time; other than sadness over the recent loss of her 35 year old dog.  Mineola has goals to breathe and feel better over all; to lose weight and to generally have a better quality of life.  Staff will follow with Amirra throughout the course of this program.       Expected Outcomes  Ceri will benefit from consistent exercise to achieve her stated goals.  She will meet with the dietician to address her weight loss goal.  Radie will also benefit from the educational and psychoeducational components of this program to learn more about her condition and discover more positive ways of coping in general.      Continue Psychosocial Services   Follow up required by staff       Psychosocial Re-Evaluation: Psychosocial Re-Evaluation    [place] Name 07/14/17 1158 07/25/17 1154 09/10/17 1232 11/05/17 1227 11/28/17 1227     Psychosocial Re-Evaluation   Current issues with  Current Stress Concerns  Current Stress Concerns  Current Stress Concerns  Current Stress Concerns  Current Stress Concerns   Comments  Iyah is still stressed from losing her dog and other family issues - nephew and sister.  Arlee is starting to heal from losing her dog. She is enjoying coming to [facility] as a way to step away from work as well.   Leyton reports continual healing from the loss of her dog. She is staying busy with working at home, going out with her three sons and coming to [employer]. She reports work is getting better and is no longer a stressor  Cortlyn got a new car that has payments and things are going well with that. Her old car died and she was upset with that. Smoking is worrying her alot and knows it is not easy to quit. She wants to be  strong and quit smoking. She states she should work in the office and that would help her quit more. Her boyfriend smokes and she has talked to him about quitting together.  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 She was getting discouraged while she was out and she could not exercise because her weight was going up instead of down and she was missing her exercise.  She has set a quit date for next Wednesday. She is still doing well with car payments and got them were they needed to be.  Her boyfriend has quit smoking and is supportive.   She has finally reached a place where things are starting to calm down. Her biggest stressor is the heat currently.  She is considering joining [employer].  She is leaning towards coming 3 days a week.    Expected Outcomes  Short - Naiomy is looking for another dog Long - Shamarra will continue to exercise to get out of the house and deal with stress  Short: Dennette will continue to come to [place]; Long: Mirha will find other activities besides LW to get out of the house.   Short: Neilani will graduate from [employer]: Babita will enroll in [employer] or another exercise program to stay active and get out of the house  Short: decrease smoking intake with her boyfriend. Long: Quit smoking to decrease stress.  Short: Graduate and quit smoking.  Long: Continue to decrease stress and cope postively.    Interventions  Encouraged to attend Pulmonary Rehabilitation for the exercise  Encouraged to attend Pulmonary Rehabilitation for the exercise  -  Encouraged to attend Pulmonary Rehabilitation for the exercise  Encouraged to attend Pulmonary Rehabilitation for the exercise   Continue Psychosocial Services   Follow up required by staff  -  -  Follow up required by staff  Follow up required by staff      Psychosocial Discharge (Final Psychosocial Re-Evaluation): Psychosocial Re-Evaluation - 11/28/17 1227      Psychosocial Re-Evaluation   Current issues with  Current Stress Concerns    Comments  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 She was getting discouraged while she was out and she could not exercise because her weight was going up instead of down and she was missing her exercise.  She has set a quit date for next Wednesday. She is still doing well with car payments and got them were they needed to be.  Her boyfriend has quit smoking and is supportive.  She has finally reached a place where things are starting to calm down. Her biggest stressor is the heat currently.  She is considering  joining [employer].  She is leaning towards coming 3 days a week.     Expected Outcomes  Short: Graduate and quit smoking.  Long: Continue to decrease stress and cope postively.     Interventions  Encouraged to attend Pulmonary Rehabilitation for the exercise    Continue Psychosocial Services   Follow up required by staff       Education: Education Goals: Education classes will be provided on a weekly basis, covering required topics. Participant will state understanding/return demonstration of topics presented.  Learning Barriers/Preferences: Learning Barriers/Preferences - 06/17/17 1523      Learning Barriers/Preferences   Learning Barriers  None    Learning Preferences  None       Education Topics:  Initial Evaluation Education: - Verbal, written and demonstration of respiratory meds,  oximetry and breathing techniques. Instruction on use of nebulizers and MDIs and importance of monitoring MDI activations.   Pulmonary Rehab from 12/12/2017 in [facility] Cardiac and Pulmonary Rehab  Date  06/17/17  Educator  [facility]  Instruction Review Code  1- Verbalizes Understanding      General Nutrition Guidelines/Fats and Fiber: -Group instruction provided by verbal, written material, models and posters to present the general guidelines for heart healthy nutrition. Gives an explanation and review of dietary fats and fiber.   Pulmonary Rehab from 12/12/2017 in [facility] Cardiac and Pulmonary Rehab  Date  08/04/17  Educator  [facility]  Instruction Review Code  1- Verbalizes Understanding      Controlling Sodium/Reading Food Labels: -Group verbal and written material supporting the discussion of sodium use in heart healthy nutrition. Review and explanation with models, verbal and written materials for utilization of the food label.   Pulmonary Rehab from 12/12/2017 in [facility] Cardiac and Pulmonary Rehab  Date  12/01/17  Educator  CR  Instruction Review Code  1- Verbalizes Understanding      Exercise Physiology &  General Exercise Guidelines: - Group verbal and written instruction with models to review the exercise physiology of the cardiovascular system and associated critical values. Provides general exercise guidelines with specific guidelines to those with heart or lung disease.    Pulmonary Rehab from 12/12/2017 in [facility] Cardiac and Pulmonary Rehab  Date  10/31/17  Educator  [facility]  Instruction Review Code  1- Verbalizes Understanding      Aerobic Exercise & Resistance Training: - Gives group verbal and written instruction on the various components of exercise. Focuses on aerobic and resistive training programs and the benefits of this training and how to safely progress through these programs.   Pulmonary Rehab from 12/12/2017 in [facility] Cardiac and Pulmonary Rehab  Date  11/26/17  Educator  [facility]  Instruction Review Code  1- Verbalizes Understanding      Flexibility, Balance, Mind/Body Relaxation: Provides group verbal/written instruction on the benefits of flexibility and balance training, including mind/body exercise modes such as yoga, pilates and tai chi.  Demonstration and skill practice provided.   Pulmonary Rehab from 12/12/2017 in [facility] Cardiac and Pulmonary Rehab  Date  12/12/17  Educator  AS  Instruction Review Code  1- Verbalizes Understanding      Stress and Anxiety: - Provides group verbal and written instruction about the health risks of elevated stress and causes of high stress.  Discuss the correlation between heart/lung disease and anxiety and treatment options. Review healthy ways to manage with stress and anxiety.   Pulmonary Rehab from 12/12/2017 in [facility] Cardiac and Pulmonary Rehab  Date  10/08/17  Educator  [facility]  Instruction Review Code  1- Verbalizes Understanding      Depression: - Provides group verbal and written instruction on the correlation between heart/lung disease and depressed mood, treatment options, and the stigmas associated with seeking treatment.   Exercise &  Equipment Safety: - Individual verbal instruction and demonstration of equipment use and safety with use of the equipment.   Pulmonary Rehab from 12/12/2017 in [facility] Cardiac and Pulmonary Rehab  Date  06/17/17  Educator  [facility]  Instruction Review Code  1- Verbalizes Understanding      Infection Prevention: - Provides verbal and written material to individual with discussion of infection control including proper hand washing and proper equipment cleaning during exercise session.   Pulmonary Rehab from 12/12/2017 in [facility] Cardiac and Pulmonary Rehab  Date  06/17/17  Educator  [facility]  Instruction Review Code  1- Verbalizes Understanding      Falls Prevention: - Provides verbal and written material to individual with discussion of falls prevention and safety.   Pulmonary Rehab from 12/12/2017 in [facility] Cardiac and Pulmonary Rehab  Date  06/17/17  Educator  [facility]  Instruction Review Code  1- Verbalizes Understanding      Diabetes: - Individual verbal and written instruction to review signs/symptoms of diabetes, desired ranges of glucose level fasting, after meals and with exercise. Advice that pre and post exercise glucose checks will be done for 3 sessions at entry of program.   Chronic Lung Diseases: - Group verbal and written instruction to review updates, respiratory medications, advancements in procedures and treatments. Discuss use of supplemental oxygen including available portable oxygen systems, continuous and intermittent flow rates, concentrators, personal use and safety guidelines. Review proper use of inhaler and spacers. Provide informative websites for self-education.    Energy Conservation: - Provide group verbal and written instruction for methods to conserve energy, plan and organize activities. Instruct on pacing techniques, use of adaptive equipment and posture/positioning to relieve shortness of breath.   Triggers and Exacerbations: - Group verbal and written instruction to review  types of environmental triggers and ways to prevent exacerbations. Discuss weather changes, air quality and the benefits of nasal washing. Review warning signs and symptoms to help prevent infections. Discuss techniques for effective airway clearance, coughing, and vibrations.   AED/CPR: - Group verbal and written instruction with the use of models to demonstrate the basic use of the AED with the basic ABC's of resuscitation.   Pulmonary Rehab from 12/12/2017 in [facility] Cardiac and Pulmonary Rehab  Date  07/11/17  Educator  [facility]  Instruction Review Code  1- Actuary and Physiology of the Lungs: - Group verbal and written instruction with the use of models to provide basic lung anatomy and physiology related to function, structure and complications of lung disease.   Pulmonary Rehab from 12/12/2017 in [facility] Cardiac and Pulmonary Rehab  Date  07/23/17  Educator  [facility]  Instruction Review Code  1- Verbalizes Understanding      Anatomy & Physiology of the Heart: - Group verbal and written instruction and models provide basic cardiac anatomy and physiology, with the coronary electrical and arterial systems. Review of Valvular disease and Heart Failure   Pulmonary Rehab from 12/12/2017 in [facility] Cardiac and Pulmonary Rehab  Date  09/10/17  Educator  [facility]  Instruction Review Code  1- Verbalizes Understanding      Cardiac Medications: - Group verbal and written instruction to review commonly prescribed medications for heart disease. Reviews the medication, class of the drug, and side effects.   Pulmonary Rehab from 12/12/2017 in [facility] Cardiac and Pulmonary Rehab  Date  06/27/17  Educator  [facility]  Instruction Review Code  1- Verbalizes Understanding      Know Your Numbers and Risk Factors: -Group verbal and written instruction about important numbers in your health.  Discussion of what are risk factors and how they play a role in the disease process.  Review of Cholesterol, Blood  Pressure, Diabetes, and BMI and the role they play in your overall health.   Pulmonary Rehab from 12/12/2017 in [facility] Cardiac and Pulmonary Rehab  Date  08/22/17  Educator  [facility]  Instruction Review Code  1- Verbalizes Understanding      Sleep Hygiene: -Provides group verbal and written instruction about how sleep can affect your  health.  Define sleep hygiene, discuss sleep cycles and impact of sleep habits. Review good sleep hygiene tips.    Pulmonary Rehab from 12/12/2017 in [facility] Cardiac and Pulmonary Rehab  Date  11/05/17  Educator  [facility]  Instruction Review Code  1- Verbalizes Understanding      Other: -Provides group and verbal instruction on various topics (see comments)    Knowledge Questionnaire Score: Knowledge Questionnaire Score - 12/01/17 1148      Knowledge Questionnaire Score   Post Score  16        Core Components/Risk Factors/Patient Goals at Admission: Personal Goals and Risk Factors at Admission - 06/17/17 1526      Core Components/Risk Factors/Patient Goals on Admission    Weight Management  Yes;Obesity;Weight Loss    Intervention  Weight Management: Develop a combined nutrition and exercise program designed to reach desired caloric intake, while maintaining appropriate intake of nutrient and fiber, sodium and fats, and appropriate energy expenditure required for the weight goal.;Weight Management: Provide education and appropriate resources to help participant work on and attain dietary goals.;Weight Management/Obesity: Establish reasonable short term and long term weight goals.;Obesity: Provide education and appropriate resources to help participant work on and attain dietary goals.    Admit Weight  263 lb 3.2 oz (119.4 kg)    Goal Weight: Short Term  258 lb (117 kg)    Goal Weight: Long Term  160 lb (72.6 kg)    Expected Outcomes  Short Term: Continue to assess and modify interventions until short term weight is achieved;Long Term: Adherence to nutrition and physical  activity/exercise program aimed toward attainment of established weight goal;Weight Maintenance: Understanding of the daily nutrition guidelines, which includes 25-35% calories from fat, 7% or less cal from saturated fats, less than '200mg'$  cholesterol, less than 1.5gm of sodium, & 5 or more servings of fruits and vegetables daily;Weight Loss: Understanding of general recommendations for a balanced deficit meal plan, which promotes 1-2 lb weight loss per week and includes a negative energy balance of [phone] kcal/d;Understanding recommendations for meals to include 15-35% energy as protein, 25-35% energy from fat, 35-60% energy from carbohydrates, less than '200mg'$  of dietary cholesterol, 20-35 gm of total fiber daily;Understanding of distribution of calorie intake throughout the day with the consumption of 4-5 meals/snacks    Tobacco Cessation  Yes    Number of packs per day  4 cigarettes a day    Intervention  Assist the participant in steps to quit. Provide individualized education and counseling about committing to Tobacco Cessation, relapse prevention, and pharmacological support that can be provided by physician.;Advice worker, assist with locating and accessing local/national Quit Smoking programs, and support quit date choice.    Expected Outcomes  Short Term: Will demonstrate readiness to quit, by selecting a quit date.;Long Term: Complete abstinence from all tobacco products for at least 12 months from quit date.;Short Term: Will quit all tobacco product use, adhering to prevention of relapse plan.    Improve shortness of breath with ADL's  Yes    Intervention  Provide education, individualized exercise plan and daily activity instruction to help decrease symptoms of SOB with activities of daily living.    Expected Outcomes  Short Term: Improve cardiorespiratory fitness to achieve a reduction of symptoms when performing ADLs;Long Term: Be able to perform more ADLs without symptoms or  delay the onset of symptoms    Hypertension  Yes    Intervention  Provide education on lifestyle modifcations including regular physical activity/exercise, weight  management, moderate sodium restriction and increased consumption of fresh fruit, vegetables, and low fat dairy, alcohol moderation, and smoking cessation.;Monitor prescription use compliance.    Expected Outcomes  Short Term: Continued assessment and intervention until BP is < 140/67m HG in hypertensive participants. < 130/88mHG in hypertensive participants with diabetes, heart failure or chronic kidney disease.;Long Term: Maintenance of blood pressure at goal levels.       Core Components/Risk Factors/Patient Goals Review:  Goals and Risk Factor Review    Row Name 07/14/17 1152 07/14/17 1247 07/25/17 1149 09/10/17 1221 10/08/17 1140     Core Components/Risk Factors/Patient Goals Review   Personal Goals Review  Tobacco Cessation;Improve shortness of breath with ADL's;Weight Management/Obesity  Tobacco Cessation  Weight Management/Obesity;Improve shortness of breath with ADL's;Tobacco Cessation  Tobacco Cessation;Weight Management/Obesity;Hypertension;Improve shortness of breath with ADL's  -   Review  RoSiedahtates she is changing her diet to eat healthy - reducing portion sizes, especially sweets.  They are eating at home more often.  She is down to 3 cigarettes per day - one after each meal.  She state she has more energy since beginning to exercise.  She is checking her O2 and it was better today.  She is now taking wellbutrin.  Also gave her a fake cigarette to use to help stop smoking.  She also plans to use the patches if needed.   RoSabriahs down 2 lb with the motivation to keep losing weight. Her shortness of breath has decreased since starting LW and she has noticed an improvement in her stamina. She is up to 5 cigs yesterday, but felt guilty about it and getting back to three a day and then quitting. She is very motivated to quit.    RoBirgittas down to 162 (admit [phone] She is really wanting to lose more, but has had some set backs recently with a growth having to be cut off of her leg and issues with the stitches. She is wanting to work hard these next few weeks in the program to lose weight and work on her SOB. She can tell a huge difference in her breathing, especially after how it became worse with her being out of [facility]. She is looking forward to it improving again. With her extra time at home, she picked back up smoking 5-6 cigs a day, but is wanting to try the patch.   RoLynnmarieeturned after a month long absence due to her leg procedure. Hopefully plans to attend regularly.    Expected Outcomes  Short - RoLindyill schedule and meet with RD and continue to eat healthy  Long - RoHasnaill reach first weight goal and continue to eat healthy  Short: Continue to work on quitting and use new fake cigarette.  Long: Continue to work towards cessation.   Short: continue on working on quitting smoking, come to [facility] work on SOB, stamina, and weight goals Long: continue to work on cessation and work on losing weight to get to her end goal of 160 lb.   Short: RoBrelynill continue to lose weight and improve SOB by attending [facility] and decrease smoking in a timely manner. Long: RoTisharaill graduate [facility] and quit smoking.   -   Row Name 11/05/17 1215 11/28/17 1216           Core Components/Risk Factors/Patient Goals Review   Personal Goals Review  Tobacco Cessation;Weight Management/Obesity;Hypertension;Improve shortness of breath with ADL's  Tobacco Cessation;Weight Management/Obesity;Hypertension;Improve shortness of breath with ADL's  Review  Trinetta has not decresed her smoking intake. She is trying to quit but cant find the time to go to a smoking cessation class. Her blood pressure has been doing better than when she started.  Landri is taking her blood pressure more and keeping a closer eye on it.  She is down to four  cigarettes a day. We are going to set her quit date for her walk test day next Wednesday!!  She wants to be done!!  Her weight is up some from being out and not moving as much.  But now she is back to being able to move and will start losing again.  She is doing better with her breathing overall.       Expected Outcomes  Short: work on decreasing her smoking intake. Long: quit smoking.  Short: Successful meet quit date to quit smoking!!   Long: Continue to work on weight loss!!         Core Components/Risk Factors/Patient Goals at Discharge (Final Review):  Goals and Risk Factor Review - 11/28/17 1216      Core Components/Risk Factors/Patient Goals Review   Personal Goals Review  Tobacco Cessation;Weight Management/Obesity;Hypertension;Improve shortness of breath with ADL's    Review  Chenelle is taking her blood pressure more and keeping a closer eye on it.  She is down to four cigarettes a day. We are going to set her quit date for her walk test day next Wednesday!!  She wants to be done!!  Her weight is up some from being out and not moving as much.  But now she is back to being able to move and will start losing again.  She is doing better with her breathing overall.     Expected Outcomes  Short: Successful meet quit date to quit smoking!!   Long: Continue to work on weight loss!!       ITP Comments: ITP Comments    Row Name 06/17/17 1428 06/23/17 0935 07/21/17 0837 08/18/17 0900 09/15/17 0848   ITP Comments  Medical Evaluation completed. Chart sent for review and changes to [name] Director of [place]. Diagnosis can be found in [facility] encounter 05/30/17  30 day review completed. ITP sent to [name] Director of [place]. Continue with ITP unless changes are made by physician.  30 day review completed. ITP sent to [name] Director of [place]. Continue with ITP unless changes are made by physician.   30 day review completed. ITP sent to [name] Director of [place].  Continue with ITP unless changes are made by physician   30 day review completed. ITP sent to [name] Director of [place]. Continue with ITP unless changes are made by physician   Row Name 09/30/17 1456 10/01/17 1439 10/08/17 1138 10/10/17 1114 10/13/17 0837   ITP Comments  Called to check on status of return.  She had another place removed on her shin on 5/9. She had a wound check today and was cleared to return to rehab tomorrow.    Ilisa called to let us know that her wound started to bleed again today.  She was to go see the doctor today and will return next week.   Tanaka returned after a month long absence due to her leg procedure. Hopefully plans to attend regularly.   Itzia called to let us know that she will be out until next Wednesday. She got a surprise beach trip.    30 day review completed. ITP  sent to [name] Director of [place]. Continue with ITP unless changes are made by physician   Row Name 11/10/17 1524 12/08/17 0903 12/08/17 0944 12/12/17 1220     ITP Comments   30 day review completed. ITP sent to [name] Director of [place]. Continue with ITP unless changes are made by physician   30 day review completed. ITP sent to [name] Director of [place]. Continue with ITP unless changes are made by physician  Maddison states her husband is in the ICU at [facility] and does not know when she will be back to [place].  Discharge ITP sent and signed by Dr. Sabra Heck.  Discharge Summary routed to PCP and cardiologist.       Comments: Discharge ITP

## 2018-01-16 ENCOUNTER — Other Ambulatory Visit: Payer: Self-pay | Admitting: Physical Medicine and Rehabilitation

## 2018-01-16 DIAGNOSIS — G8929 Other chronic pain: Secondary | ICD-10-CM

## 2018-01-16 DIAGNOSIS — M545 Low back pain, unspecified: Secondary | ICD-10-CM

## 2018-01-22 ENCOUNTER — Ambulatory Visit
Admission: RE | Admit: 2018-01-22 | Discharge: 2018-01-22 | Disposition: A | Discharge status: Home / Self Care | Payer: BLUE CROSS/BLUE SHIELD | Source: Ambulatory Visit | Attending: Physical Medicine and Rehabilitation | Admitting: Physical Medicine and Rehabilitation

## 2018-01-22 DIAGNOSIS — M47816 Spondylosis without myelopathy or radiculopathy, lumbar region: Secondary | ICD-10-CM | POA: Insufficient documentation

## 2018-01-22 DIAGNOSIS — M545 Low back pain: Secondary | ICD-10-CM | POA: Diagnosis not present

## 2018-01-22 DIAGNOSIS — G8929 Other chronic pain: Secondary | ICD-10-CM

## 2018-07-15 ENCOUNTER — Inpatient Hospital Stay (HOSPITAL_COMMUNITY)
Admit: 2018-07-15 | Discharge: 2018-07-15 | Disposition: A | Discharge status: Home / Self Care | Payer: BLUE CROSS/BLUE SHIELD | Attending: Family Medicine | Admitting: Family Medicine

## 2018-07-15 ENCOUNTER — Inpatient Hospital Stay
Admission: EM | Admit: 2018-07-15 | Discharge: 2018-07-16 | DRG: 190 | Disposition: A | Discharge status: Home Health | Payer: BLUE CROSS/BLUE SHIELD | Attending: Internal Medicine | Admitting: Internal Medicine

## 2018-07-15 ENCOUNTER — Inpatient Hospital Stay: Admit: 2018-07-15 | Payer: BLUE CROSS/BLUE SHIELD

## 2018-07-15 ENCOUNTER — Other Ambulatory Visit: Payer: Self-pay

## 2018-07-15 ENCOUNTER — Emergency Department: Payer: BLUE CROSS/BLUE SHIELD

## 2018-07-15 DIAGNOSIS — J9621 Acute and chronic respiratory failure with hypoxia: Secondary | ICD-10-CM | POA: Diagnosis present

## 2018-07-15 DIAGNOSIS — J209 Acute bronchitis, unspecified: Secondary | ICD-10-CM | POA: Diagnosis present

## 2018-07-15 DIAGNOSIS — I5032 Chronic diastolic (congestive) heart failure: Secondary | ICD-10-CM | POA: Diagnosis present

## 2018-07-15 DIAGNOSIS — I110 Hypertensive heart disease with heart failure: Secondary | ICD-10-CM | POA: Diagnosis present

## 2018-07-15 DIAGNOSIS — K219 Gastro-esophageal reflux disease without esophagitis: Secondary | ICD-10-CM | POA: Diagnosis present

## 2018-07-15 DIAGNOSIS — J440 Chronic obstructive pulmonary disease with acute lower respiratory infection: Secondary | ICD-10-CM | POA: Diagnosis present

## 2018-07-15 DIAGNOSIS — J9811 Atelectasis: Secondary | ICD-10-CM | POA: Diagnosis present

## 2018-07-15 DIAGNOSIS — Z8249 Family history of ischemic heart disease and other diseases of the circulatory system: Secondary | ICD-10-CM | POA: Diagnosis not present

## 2018-07-15 DIAGNOSIS — Z9981 Dependence on supplemental oxygen: Secondary | ICD-10-CM | POA: Diagnosis not present

## 2018-07-15 DIAGNOSIS — F329 Major depressive disorder, single episode, unspecified: Secondary | ICD-10-CM | POA: Diagnosis present

## 2018-07-15 DIAGNOSIS — F17210 Nicotine dependence, cigarettes, uncomplicated: Secondary | ICD-10-CM | POA: Diagnosis present

## 2018-07-15 DIAGNOSIS — L4050 Arthropathic psoriasis, unspecified: Secondary | ICD-10-CM | POA: Diagnosis present

## 2018-07-15 DIAGNOSIS — J189 Pneumonia, unspecified organism: Secondary | ICD-10-CM

## 2018-07-15 DIAGNOSIS — Z6841 Body Mass Index (BMI) 40.0 and over, adult: Secondary | ICD-10-CM | POA: Diagnosis not present

## 2018-07-15 DIAGNOSIS — Z7951 Long term (current) use of inhaled steroids: Secondary | ICD-10-CM

## 2018-07-15 DIAGNOSIS — F419 Anxiety disorder, unspecified: Secondary | ICD-10-CM | POA: Diagnosis present

## 2018-07-15 DIAGNOSIS — J441 Chronic obstructive pulmonary disease with (acute) exacerbation: Principal | ICD-10-CM | POA: Diagnosis present

## 2018-07-15 DIAGNOSIS — J449 Chronic obstructive pulmonary disease, unspecified: Secondary | ICD-10-CM | POA: Diagnosis not present

## 2018-07-15 DIAGNOSIS — R9431 Abnormal electrocardiogram [ECG] [EKG]: Secondary | ICD-10-CM | POA: Diagnosis not present

## 2018-07-15 DIAGNOSIS — I509 Heart failure, unspecified: Secondary | ICD-10-CM

## 2018-07-15 DIAGNOSIS — Z79890 Hormone replacement therapy (postmenopausal): Secondary | ICD-10-CM

## 2018-07-15 DIAGNOSIS — J181 Lobar pneumonia, unspecified organism: Secondary | ICD-10-CM

## 2018-07-15 LAB — CBC WITH DIFFERENTIAL/PLATELET
ABS IMMATURE GRANULOCYTES: 0.17 10*3/uL — AB (ref 0.00–0.07)
BASOS PCT: 1 %
Basophils Absolute: 0.1 10*3/uL (ref 0.0–0.1)
Eosinophils Absolute: 0 10*3/uL (ref 0.0–0.5)
Eosinophils Relative: 0 %
HEMATOCRIT: 41.8 % (ref 36.0–46.0)
Hemoglobin: 12.9 g/dL (ref 12.0–15.0)
IMMATURE GRANULOCYTES: 1 %
Lymphocytes Relative: 4 %
Lymphs Abs: 0.6 10*3/uL — ABNORMAL LOW (ref 0.7–4.0)
MCH: 31.1 pg (ref 26.0–34.0)
MCHC: 30.9 g/dL (ref 30.0–36.0)
MCV: 100.7 fL — AB (ref 80.0–100.0)
MONOS PCT: 8 %
Monocytes Absolute: 1.3 10*3/uL — ABNORMAL HIGH (ref 0.1–1.0)
NEUTROS ABS: 13.1 10*3/uL — AB (ref 1.7–7.7)
NEUTROS PCT: 86 %
Platelets: 202 10*3/uL (ref 150–400)
RBC: 4.15 MIL/uL (ref 3.87–5.11)
RDW: 12.9 % (ref 11.5–15.5)
WBC: 15.3 10*3/uL — ABNORMAL HIGH (ref 4.0–10.5)
nRBC: 0 % (ref 0.0–0.2)

## 2018-07-15 LAB — COMPREHENSIVE METABOLIC PANEL
ALK PHOS: 63 U/L (ref 38–126)
ALT: 29 U/L (ref 0–44)
AST: 32 U/L (ref 15–41)
Albumin: 3.6 g/dL (ref 3.5–5.0)
Anion gap: 8 (ref 5–15)
BILIRUBIN TOTAL: 1.4 mg/dL — AB (ref 0.3–1.2)
BUN: 11 mg/dL (ref 6–20)
CALCIUM: 8.9 mg/dL (ref 8.9–10.3)
CO2: 31 mmol/L (ref 22–32)
Chloride: 100 mmol/L (ref 98–111)
Creatinine, Ser: 0.33 mg/dL — ABNORMAL LOW (ref 0.44–1.00)
Glucose, Bld: 129 mg/dL — ABNORMAL HIGH (ref 70–99)
Potassium: 4.9 mmol/L (ref 3.5–5.1)
Sodium: 139 mmol/L (ref 135–145)
TOTAL PROTEIN: 7.8 g/dL (ref 6.5–8.1)

## 2018-07-15 LAB — TROPONIN I
Troponin I: 0.05 ng/mL (ref <0.03)
Troponin I: <0.03 ng/mL (ref <0.03)

## 2018-07-15 LAB — EXPECTORATED SPUTUM ASSESSMENT W GRAM STAIN, RFLX TO RESP C

## 2018-07-15 LAB — LACTIC ACID, PLASMA
LACTIC ACID, VENOUS: 0.6 mmol/L (ref 0.5–1.9)
LACTIC ACID, VENOUS: 0.7 mmol/L (ref 0.5–1.9)

## 2018-07-15 LAB — INFLUENZA PANEL BY PCR (TYPE A & B)
Influenza A By PCR: NEGATIVE
Influenza B By PCR: NEGATIVE

## 2018-07-15 LAB — TSH: TSH: 1.305 u[IU]/mL (ref 0.350–4.500)

## 2018-07-15 LAB — BRAIN NATRIURETIC PEPTIDE: B Natriuretic Peptide: 157 pg/mL — ABNORMAL HIGH (ref 0.0–100.0)

## 2018-07-15 MED ORDER — CITALOPRAM HYDROBROMIDE 20 MG PO TABS
40.0000 mg | ORAL_TABLET | Freq: Every day | ORAL | Status: DC
  Administered 2018-07-15 – 2018-07-16 (×2): 40 mg via ORAL
  Filled 2018-07-15 (×2): qty 2

## 2018-07-15 MED ORDER — SODIUM CHLORIDE 0.9 % IV SOLN
500.0000 mg | Freq: Every day | INTRAVENOUS | Status: DC
  Administered 2018-07-16: 500 mg via INTRAVENOUS
  Filled 2018-07-15 (×2): qty 500

## 2018-07-15 MED ORDER — SODIUM CHLORIDE 0.9% FLUSH
3.0000 mL | Freq: Two times a day (BID) | INTRAVENOUS | Status: DC
  Administered 2018-07-15 – 2018-07-16 (×3): 3 mL via INTRAVENOUS

## 2018-07-15 MED ORDER — GUAIFENESIN ER 600 MG PO TB12
600.0000 mg | ORAL_TABLET | Freq: Two times a day (BID) | ORAL | Status: DC
  Administered 2018-07-15 – 2018-07-16 (×3): 600 mg via ORAL
  Filled 2018-07-15 (×3): qty 1

## 2018-07-15 MED ORDER — NITROGLYCERIN 0.4 MG SL SUBL: 0.4000 mg | SUBLINGUAL_TABLET | SUBLINGUAL | Status: DC | PRN

## 2018-07-15 MED ORDER — SODIUM CHLORIDE 0.9% FLUSH: 3.0000 mL | INTRAVENOUS | Status: DC | PRN

## 2018-07-15 MED ORDER — LISINOPRIL 20 MG PO TABS
40.0000 mg | ORAL_TABLET | Freq: Every day | ORAL | Status: DC
  Administered 2018-07-15 – 2018-07-16 (×2): 40 mg via ORAL
  Filled 2018-07-15 (×2): qty 2

## 2018-07-15 MED ORDER — LAMOTRIGINE 25 MG PO TABS
25.0000 mg | ORAL_TABLET | Freq: Two times a day (BID) | ORAL | Status: DC
  Filled 2018-07-15 (×3): qty 1

## 2018-07-15 MED ORDER — HYDROCHLOROTHIAZIDE 25 MG PO TABS
25.0000 mg | ORAL_TABLET | Freq: Every day | ORAL | Status: DC
  Administered 2018-07-15 – 2018-07-16 (×2): 25 mg via ORAL
  Filled 2018-07-15 (×2): qty 1

## 2018-07-15 MED ORDER — MOMETASONE FURO-FORMOTEROL FUM 200-5 MCG/ACT IN AERO
2.0000 | INHALATION_SPRAY | Freq: Two times a day (BID) | RESPIRATORY_TRACT | Status: DC
  Administered 2018-07-15 – 2018-07-16 (×3): 2 via RESPIRATORY_TRACT
  Filled 2018-07-15: qty 8.8

## 2018-07-15 MED ORDER — SODIUM CHLORIDE 0.9 % IV SOLN
1.0000 g | Freq: Once | INTRAVENOUS | Status: AC
  Administered 2018-07-15: 1 g via INTRAVENOUS
  Filled 2018-07-15: qty 10

## 2018-07-15 MED ORDER — IPRATROPIUM-ALBUTEROL 0.5-2.5 (3) MG/3ML IN SOLN
3.0000 mL | Freq: Four times a day (QID) | RESPIRATORY_TRACT | Status: DC
  Administered 2018-07-15 – 2018-07-16 (×5): 3 mL via RESPIRATORY_TRACT
  Filled 2018-07-15 (×5): qty 3

## 2018-07-15 MED ORDER — HYDROXYZINE HCL 25 MG PO TABS
25.0000 mg | ORAL_TABLET | Freq: Every day | ORAL | Status: DC | PRN
  Filled 2018-07-15: qty 1

## 2018-07-15 MED ORDER — ONDANSETRON HCL 4 MG/2ML IJ SOLN: 4.0000 mg | Freq: Four times a day (QID) | INTRAMUSCULAR | Status: DC | PRN

## 2018-07-15 MED ORDER — IBUPROFEN 400 MG PO TABS
800.0000 mg | ORAL_TABLET | Freq: Two times a day (BID) | ORAL | Status: DC | PRN
  Administered 2018-07-15 – 2018-07-16 (×2): 800 mg via ORAL
  Filled 2018-07-15 (×2): qty 2

## 2018-07-15 MED ORDER — PERFLUTREN LIPID MICROSPHERE
1.0000 mL | INTRAVENOUS | Status: AC | PRN
  Administered 2018-07-15: 3 mL via INTRAVENOUS
  Filled 2018-07-15: qty 10

## 2018-07-15 MED ORDER — HYDRALAZINE HCL 20 MG/ML IJ SOLN: 10.0000 mg | INTRAMUSCULAR | Status: DC | PRN

## 2018-07-15 MED ORDER — SODIUM CHLORIDE 0.9 % IV SOLN
500.0000 mg | Freq: Once | INTRAVENOUS | Status: AC
  Administered 2018-07-15: 500 mg via INTRAVENOUS
  Filled 2018-07-15: qty 500

## 2018-07-15 MED ORDER — DOCUSATE SODIUM 100 MG PO CAPS
100.0000 mg | ORAL_CAPSULE | Freq: Two times a day (BID) | ORAL | Status: DC
  Administered 2018-07-15 (×2): 100 mg via ORAL
  Filled 2018-07-15 (×3): qty 1

## 2018-07-15 MED ORDER — HYDROXYZINE HCL 10 MG PO TABS
10.0000 mg | ORAL_TABLET | Freq: Four times a day (QID) | ORAL | Status: DC | PRN
  Filled 2018-07-15: qty 1

## 2018-07-15 MED ORDER — LORATADINE 10 MG PO TABS
10.0000 mg | ORAL_TABLET | Freq: Every day | ORAL | Status: DC
  Administered 2018-07-15 – 2018-07-16 (×2): 10 mg via ORAL
  Filled 2018-07-15 (×2): qty 1

## 2018-07-15 MED ORDER — SODIUM CHLORIDE 0.9 % IV SOLN
250.0000 mL | INTRAVENOUS | Status: DC | PRN
  Administered 2018-07-16: 10:00:00 250 mL via INTRAVENOUS

## 2018-07-15 MED ORDER — METHYLPREDNISOLONE SODIUM SUCC 125 MG IJ SOLR
60.0000 mg | Freq: Four times a day (QID) | INTRAMUSCULAR | Status: DC
  Administered 2018-07-15 – 2018-07-16 (×3): 60 mg via INTRAVENOUS
  Filled 2018-07-15 (×3): qty 2

## 2018-07-15 MED ORDER — LORAZEPAM 1 MG PO TABS
1.0000 mg | ORAL_TABLET | Freq: Two times a day (BID) | ORAL | Status: DC | PRN
  Administered 2018-07-15 – 2018-07-16 (×3): 1 mg via ORAL
  Filled 2018-07-15 (×3): qty 1

## 2018-07-15 MED ORDER — MORPHINE SULFATE (PF) 2 MG/ML IV SOLN: 2.0000 mg | INTRAVENOUS | Status: DC | PRN

## 2018-07-15 MED ORDER — SPIRONOLACTONE 25 MG PO TABS
25.0000 mg | ORAL_TABLET | Freq: Every day | ORAL | Status: DC
  Administered 2018-07-16: 25 mg via ORAL
  Filled 2018-07-15 (×2): qty 1

## 2018-07-15 MED ORDER — ONDANSETRON HCL 4 MG PO TABS: 4.0000 mg | ORAL_TABLET | Freq: Four times a day (QID) | ORAL | Status: DC | PRN

## 2018-07-15 MED ORDER — ENOXAPARIN SODIUM 40 MG/0.4ML ~~LOC~~ SOLN
40.0000 mg | Freq: Two times a day (BID) | SUBCUTANEOUS | Status: DC
  Filled 2018-07-15 (×2): qty 0.4

## 2018-07-15 MED ORDER — ACETAMINOPHEN 650 MG RE SUPP: 650.0000 mg | Freq: Four times a day (QID) | RECTAL | Status: DC | PRN

## 2018-07-15 MED ORDER — BUPRENORPHINE HCL-NALOXONE HCL 8-2 MG SL SUBL
1.0000 | SUBLINGUAL_TABLET | Freq: Two times a day (BID) | SUBLINGUAL | Status: DC
  Administered 2018-07-15 – 2018-07-16 (×3): 1 via SUBLINGUAL
  Filled 2018-07-15 (×3): qty 1

## 2018-07-15 MED ORDER — FUROSEMIDE 10 MG/ML IJ SOLN
40.0000 mg | Freq: Once | INTRAMUSCULAR | Status: AC
  Administered 2018-07-15: 40 mg via INTRAVENOUS
  Filled 2018-07-15: qty 4

## 2018-07-15 MED ORDER — ACETAMINOPHEN 325 MG PO TABS
650.0000 mg | ORAL_TABLET | Freq: Four times a day (QID) | ORAL | Status: DC | PRN
  Administered 2018-07-15: 650 mg via ORAL
  Filled 2018-07-15: qty 2

## 2018-07-15 MED ORDER — POLYETHYLENE GLYCOL 3350 17 G PO PACK: 17.0000 g | PACK | Freq: Every day | ORAL | Status: DC | PRN

## 2018-07-15 MED ORDER — ALBUTEROL SULFATE (2.5 MG/3ML) 0.083% IN NEBU: 2.5000 mg | INHALATION_SOLUTION | RESPIRATORY_TRACT | Status: DC | PRN

## 2018-07-15 MED ORDER — PANTOPRAZOLE SODIUM 40 MG PO TBEC
40.0000 mg | DELAYED_RELEASE_TABLET | Freq: Every day | ORAL | Status: DC
  Administered 2018-07-15 – 2018-07-16 (×2): 40 mg via ORAL
  Filled 2018-07-15 (×2): qty 1

## 2018-07-15 NOTE — ED Notes (Signed)
Pt requesting to have BiPap replaced because she "feels bad and had more energy" when the BiPap was in place.  This RN explained to pt that BiPap is for support of her respiratory status, which is not compromised at this time and that use of the BiPap unnecessarily could cause harm.  Pt encouraged to rest and allow medications to work gradually.  Pt verbalized understanding of information shared.  Will continue to monitor.

## 2018-07-15 NOTE — Progress Notes (Signed)
Family Meeting Note  Advance Directive:yes  Today a meeting took place with the Patient.  Patient is able to participate   The following clinical team members were present during this meeting:MD  The following were discussed:Patient's diagnosis: copd, Patient's progosis: Unable to determine and Goals for treatment: Full Code  Additional follow-up to be provided: prn  Time spent during discussion:20 minutes  Vidya Bamford D Ahan Eisenberger, MD  

## 2018-07-15 NOTE — ED Notes (Signed)
Chest xray completed.

## 2018-07-15 NOTE — ED Triage Notes (Signed)
Pt to the er for flu like symptoms and sob x 3 days. Pt reports a sore throat and chills and a cough. Pt has a hx of COPD is on constant 4L of O2 and is a smoker. Pt given an albuterol tx on truck. Pt denies fever but reports chills. During the triage, pt fell asleep while answering questions. When this RN had to speak loudly to wake pt, pt states she fell asleep. I asked pt if she took any meds prior to leaving home and she states she took 2 lorazepam.

## 2018-07-15 NOTE — ED Provider Notes (Signed)
[facility] Emergency Department Provider Note   ____________________________________________    I have reviewed the triage vital signs and the nursing notes.   HISTORY  Chief Complaint Shortness of breath    HPI Dana Hill is a 58 y.o. female with a history of COPD who is on chronic oxygen at home who presents with worsening shortness of breath as well as some chest discomfort.  She does not think that she has had fevers.  No significant myalgias.  No recent travel.  No nausea or vomiting.  She describes nonproductive cough.  Has not take anything for this  Past Medical History:  Diagnosis Date  . Asthma   . COPD (chronic obstructive pulmonary disease) (HCC)   . Degenerative disc disease, cervical   . Depression   . GERD (gastroesophageal reflux disease)   . Hypertension   . Osteoarthritis   . Psoriatic arthritis (HCC)   . Wears contact lenses    right eye    There are no active problems to display for this patient.   Past Surgical History:  Procedure Laterality Date  . CATARACT EXTRACTION W/PHACO Left 04/24/2016   Procedure: CATARACT EXTRACTION PHACO AND INTRAOCULAR LENS PLACEMENT (IOC);  Surgeon: Lockie Mola, MD;  Location: [facility];  Service: Ophthalmology;  Laterality: Left;  . TONSILLECTOMY    . TUBAL LIGATION      Prior to Admission medications   Medication Sig Start Date End Date Taking? Authorizing Provider  albuterol (PROVENTIL HFA;VENTOLIN HFA) 108 (90 Base) MCG/ACT inhaler Inhale 2 puffs into the lungs every 6 (six) hours as needed for wheezing or shortness of breath.    [provider]  budesonide-formoterol (SYMBICORT) 160-4.5 MCG/ACT inhaler Inhale 2 puffs into the lungs 2 (two) times daily.    [provider]  buprenorphine-naloxone (SUBOXONE) 8-2 MG SUBL SL tablet Place 1 tablet under the tongue 2 (two) times daily.    [provider]  citalopram (CELEXA) 40 MG tablet Take 40  mg by mouth daily.    [provider]  estradiol (VIVELLE-DOT) 0.0375 MG/24HR Place 1 patch onto the skin 2 (two) times a week.    [provider]  fexofenadine (ALLEGRA) 180 MG tablet Take 180 mg by mouth daily as needed for allergies or rhinitis.    [provider]  hydrochlorothiazide (HYDRODIURIL) 25 MG tablet Take 25 mg by mouth daily.    [provider]  hydrOXYzine (ATARAX/VISTARIL) 25 MG tablet Take 25 mg by mouth daily as needed for itching.    [provider]  ibuprofen (ADVIL,MOTRIN) 800 MG tablet Take 800 mg by mouth 2 (two) times daily as needed.    [provider]  lamoTRIgine (LAMICTAL) 25 MG tablet Take 25 mg by mouth 2 (two) times daily.    [provider]  lisinopril (PRINIVIL,ZESTRIL) 40 MG tablet Take 40 mg by mouth daily.    [provider]  LORazepam (ATIVAN) 1 MG tablet Take 1 mg by mouth 2 (two) times daily as needed for anxiety.    [provider]  omeprazole (PRILOSEC) 20 MG capsule Take 20 mg by mouth daily.    [provider]  progesterone (PROMETRIUM) 100 MG capsule Take 100 mg by mouth at bedtime.    [provider]  Secukinumab (COSENTYX 300 DOSE) 150 MG/ML SOSY Inject into the skin every 30 (thirty) days. Last dose 04/02/16    [provider]  spironolactone (ALDACTONE) 25 MG tablet Take 25 mg by mouth daily.  [provider]     Allergies Patient has no known allergies.  History reviewed. No pertinent family history.  Social History Social History   Tobacco Use  . Smoking status: Current Every Day Smoker    Packs/day: 0.25    Years: 25.00    Pack years: 6.25    Types: Cigarettes  . Smokeless tobacco: Never Used  . Tobacco comment: given fake cigarette today, she is on wellbutrin and has patches on standby.  Substance Use Topics  . Alcohol use: No  . Drug use: Not Currently    Review of Systems  Constitutional: No  fever/chills Eyes: No visual changes.  ENT: No sore throat. Cardiovascular: As above Respiratory: As above Gastrointestinal: No abdominal pain.   Genitourinary: Negative for dysuria. Musculoskeletal: Negative for back pain. Skin: Negative for rash. Neurological: Negative for headaches    ____________________________________________   PHYSICAL EXAM:  VITAL SIGNS: ED Triage Vitals [07/15/18 0635]  Enc Vitals Group     BP (!) 155/137     Pulse Rate 92     Resp 18     Temp 99 F (37.2 C)     Temp Source Oral     SpO2 95 %     Weight 117.9 kg (260 lb)     Height 1.613 m (5' 3.5")     Head Circumference      Peak Flow      Pain Score Asleep     Pain Loc      Pain Edu?      Excl. in GC?     Constitutional: Alert and oriented.  Eyes: Conjunctivae are normal.   Nose: No congestion/rhinnorhea. Mouth/Throat: Mucous membranes are moist.    Cardiovascular: Normal rate, regular rhythm. Grossly normal heart sounds.  Good peripheral circulation. Respiratory: Increased respiratory effort with mild tachypnea, scattered wheezes, bibasilar Rales Gastrointestinal: Soft and nontender. No distention.  Musculoskeletal: No lower extremity tenderness nor edema.  Warm and well perfused Neurologic:  Normal speech and language. No gross focal neurologic deficits are appreciated.  Skin:  Skin is warm, dry and intact. No rash noted. Psychiatric: Mood and affect are normal. Speech and behavior are normal.  ____________________________________________   LABS (all labs ordered are listed, but only abnormal results are displayed)  Labs Reviewed  CBC WITH DIFFERENTIAL/PLATELET - Abnormal; Notable for the following components:      Result Value   WBC 15.3 (*)    MCV 100.7 (*)    Neutro Abs 13.1 (*)    Lymphs Abs 0.6 (*)    Monocytes Absolute 1.3 (*)    Abs Immature Granulocytes 0.17 (*)    All other components within normal limits  COMPREHENSIVE METABOLIC PANEL - Abnormal; Notable for  the following components:   Glucose, Bld 129 (*)    Creatinine, Ser 0.33 (*)    Total Bilirubin 1.4 (*)    All other components within normal limits  TROPONIN I - Abnormal; Notable for the following components:   Troponin I 0.05 (*)    All other components within normal limits  BRAIN NATRIURETIC PEPTIDE - Abnormal; Notable for the following components:   B Natriuretic Peptide 157.0 (*)    All other components within normal limits  CULTURE, BLOOD (ROUTINE X 2)  CULTURE, BLOOD (ROUTINE X 2)  LACTIC ACID, PLASMA  LACTIC ACID, PLASMA   ____________________________________________  EKG  ED ECG REPORT I, Jene Every, the attending physician, personally viewed and interpreted this ECG.  Date: 07/15/2018  Rhythm: normal sinus  rhythm QRS Axis: normal Intervals: normal ST/T Wave abnormalities: normal Narrative Interpretation: no evidence of acute ischemia  ____________________________________________  RADIOLOGY  Chest x-ray with basilar atelectasis primarily on the left and vascular congestion ____________________________________________   PROCEDURES  Procedure(s) performed: No  Procedures   Critical Care performed: yes  CRITICAL CARE Performed by: Jene Every   Total critical care time: 30 minutes  Critical care time was exclusive of separately billable procedures and treating other patients.  Critical care was necessary to treat or prevent imminent or life-threatening deterioration.  Critical care was time spent personally by me on the following activities: development of treatment plan with patient and/or surrogate as well as nursing, discussions with consultants, evaluation of patient's response to treatment, examination of patient, obtaining history from patient or surrogate, ordering and performing treatments and interventions, ordering and review of laboratory studies, ordering and review of radiographic studies, pulse oximetry and re-evaluation of patient's  condition.  ____________________________________________   INITIAL IMPRESSION / ASSESSMENT AND PLAN / ED COURSE  Pertinent labs & imaging results that were available during my care of the patient were reviewed by me and considered in my medical decision making (see chart for details).  Patient presents with worsening shortness of breath, history of COPD, no reports of fevers, dry cough.  Wheezing on exam but some rales as well.  Chest x-ray consistent with fluid overload but also with some atelectasis and given her elevated white blood cell count suspicious for pneumonia.  Will cover with IV Rocephin and azithromycin, give IV Lasix as well as a DuoNeb and Solu-Medrol   ----------------------------------------- 10:03 AM on 07/15/2018 -----------------------------------------  Patient becoming increasingly anxious and more short of breath, will start her on BiPAP for a brief period of time   ----------------------------------------- 11:41 AM on 07/15/2018 -----------------------------------------  Patient felt better after brief time on BiPAP, certainly there is a component of anxiety.  Discussed with Dr. Katheren Shams for admission for continued short of breath, likely pneumonia, COPD and CHF    ____________________________________________   FINAL CLINICAL IMPRESSION(S) / ED DIAGNOSES  Final diagnoses:  Community acquired pneumonia of left lower lobe of lung (HCC)  Congestive heart failure, unspecified HF chronicity, unspecified heart failure type (HCC)  Chronic obstructive pulmonary disease, unspecified COPD type (HCC)        Note:  This document was prepared using Dragon voice recognition software and may include unintentional dictation errors.   Jene Every, MD 07/15/18 [phone]

## 2018-07-15 NOTE — ED Notes (Signed)
BiPap removed and pt placed on 4LNC at this time.  Pt talking in complete uninterrupted sentences, NAD noted, pt voices no c/o at this time.  Will continue to monitor.

## 2018-07-15 NOTE — ED Notes (Signed)
Pt anxious. Pt states "I feel like I cannot pee or breathe". Dr Cyril Loosen at bedside. RT called for bipap.  Sheets changed and pt cleaned from bedpan/urine

## 2018-07-15 NOTE — H&P (Signed)
[employer] - [place] [facility]   PATIENT NAME: Dana AbrahamsRobin Hill    MR#:  [id number]  DATE OF BIRTH:  01/11/1961  DATE OF ADMISSION:  07/15/2018  PRIMARY CARE PHYSICIAN: Sherol DadeBerendzen, Susan M, MD   REQUESTING/REFERRING PHYSICIAN:   CHIEF COMPLAINT:   Chief Complaint  Patient presents with  . Influenza    HISTORY OF PRESENT ILLNESS: Dana Hill  is a 58 y.o. female with a known history of per below which includes chronic respiratory failure-on 4 L continuous at home, presents emergency room with 3 to 4-day history of worsening shortness of breath, dyspnea on exertion, sore throat, chills, chest congestion, productive cough, did require BiPAP for short stent in the emergency room-successfully weaned off, and emergency room patient was found to have a white count of 15,000, troponin 0.05, BNP 157, chest x-ray noted for ? CHF changes/atelectasis, hospitalist asked to admit, patient evaluated in the emergency room, no apparent distress, resting comfortably in bed, stable on her 4 L via nasal cannula satting 90 to 92%, patient is now being admitted for acute on COPD/asthmatic exacerbation.  PAST MEDICAL HISTORY:   Past Medical History:  Diagnosis Date  . Asthma   . COPD (chronic obstructive pulmonary disease) (HCC)   . Degenerative disc disease, cervical   . Depression   . GERD (gastroesophageal reflux disease)   . Hypertension   . Osteoarthritis   . Psoriatic arthritis (HCC)   . Wears contact lenses    right eye    PAST SURGICAL HISTORY:  Past Surgical History:  Procedure Laterality Date  . CATARACT EXTRACTION W/PHACO Left 04/24/2016   Procedure: CATARACT EXTRACTION PHACO AND INTRAOCULAR LENS PLACEMENT (IOC);  Surgeon: Lockie Molahadwick Brasington, MD;  Location[facility];  Service: Ophthalmology;  Laterality: Left;  . TONSILLECTOMY    . TUBAL LIGATION      SOCIAL HISTORY:  Social History   Tobacco Use  . Smoking status: Current Every Day Smoker    Packs/day:  0.25    Years: 25.00    Pack years: 6.25    Types: Cigarettes  . Smokeless tobacco: Never Used  . Tobacco comment: given fake cigarette today, she is on wellbutrin and has patches on standby.  Substance Use Topics  . Alcohol use: No    FAMILY HISTORY:  Hypertension  DRUG ALLERGIES: No Known Allergies  REVIEW OF SYSTEMS:   CONSTITUTIONAL: No fever, +fatigue, weakness.  EYES: No blurred or double vision.  EARS, NOSE, AND THROAT: No tinnitus or ear pain.  RESPIRATORY: + cough, shortness of breath, wheezing  CARDIOVASCULAR: No chest pain, orthopnea, edema.  GASTROINTESTINAL: No nausea, vomiting, diarrhea or abdominal pain.  GENITOURINARY: No dysuria, hematuria.  ENDOCRINE: No polyuria, nocturia,  HEMATOLOGY: No anemia, easy bruising or bleeding SKIN: No rash or lesion. MUSCULOSKELETAL: No joint pain or arthritis.   NEUROLOGIC: No tingling, numbness, weakness.  PSYCHIATRY: No anxiety or depression.   MEDICATIONS AT HOME:  Prior to Admission medications   Medication Sig Start Date End Date Taking? Authorizing Provider  albuterol (PROVENTIL HFA;VENTOLIN HFA) 108 (90 Base) MCG/ACT inhaler Inhale 2 puffs into the lungs every 6 (six) hours as needed for wheezing or shortness of breath.    [provider]  budesonide-formoterol (SYMBICORT) 160-4.5 MCG/ACT inhaler Inhale 2 puffs into the lungs 2 (two) times daily.    [provider]  buprenorphine-naloxone (SUBOXONE) 8-2 MG SUBL SL tablet Place 1 tablet under the tongue 2 (two) times daily.    [provider]  citalopram (CELEXA)  40 MG tablet Take 40 mg by mouth daily.    [provider]  estradiol (VIVELLE-DOT) 0.0375 MG/24HR Place 1 patch onto the skin 2 (two) times a week.    [provider]  fexofenadine (ALLEGRA) 180 MG tablet Take 180 mg by mouth daily as needed for allergies or rhinitis.    [provider]  hydrochlorothiazide (HYDRODIURIL) 25 MG tablet Take 25 mg by mouth  daily.    [provider]  hydrOXYzine (ATARAX/VISTARIL) 25 MG tablet Take 25 mg by mouth daily as needed for itching.    [provider]  ibuprofen (ADVIL,MOTRIN) 800 MG tablet Take 800 mg by mouth 2 (two) times daily as needed.    [provider]  lamoTRIgine (LAMICTAL) 25 MG tablet Take 25 mg by mouth 2 (two) times daily.    [provider]  lisinopril (PRINIVIL,ZESTRIL) 40 MG tablet Take 40 mg by mouth daily.    [provider]  LORazepam (ATIVAN) 1 MG tablet Take 1 mg by mouth 2 (two) times daily as needed for anxiety.    [provider]  omeprazole (PRILOSEC) 20 MG capsule Take 20 mg by mouth daily.    [provider]  progesterone (PROMETRIUM) 100 MG capsule Take 100 mg by mouth at bedtime.    [provider]  Secukinumab (COSENTYX 300 DOSE) 150 MG/ML SOSY Inject into the skin every 30 (thirty) days. Last dose 04/02/16    [provider]  spironolactone (ALDACTONE) 25 MG tablet Take 25 mg by mouth daily.    [provider]      PHYSICAL EXAMINATION:   VITAL SIGNS: Blood pressure (!) 129/112, pulse 95, temperature 99 F (37.2 C), temperature source Oral, resp. rate (!) 23, height 5' 3.5" (1.613 m), weight 117.9 kg, SpO2 95 %.  GENERAL:  58 y.o.-year-old patient lying in the bed with no acute distress.  Morbidly obese eYES: Pupils equal, round, reactive to light and accommodation. No scleral icterus. Extraocular muscles intact.  HEENT: Head atraumatic, normocephalic. Oropharynx and nasopharynx clear.  NECK:  Supple, no jugular venous distention. No thyroid enlargement, no tenderness.  LUNGS: Diminished breath sounds with rhonchi/wheezing throughout. No use of accessory muscles of respiration.  CARDIOVASCULAR: S1, S2 normal. No murmurs, rubs, or gallops.  ABDOMEN: Soft, nontender, nondistended. Bowel sounds present. No organomegaly or mass.  EXTREMITIES: No pedal edema, cyanosis, or clubbing.   NEUROLOGIC: Cranial nerves II through XII are intact. Muscle strength 5/5 in all extremities. Sensation intact. Gait not checked.  PSYCHIATRIC: The patient is alert and oriented x 3.  SKIN: No obvious rash, lesion, or ulcer.   LABORATORY PANEL:   CBC Recent Labs  Lab 07/15/18 0734  WBC 15.3*  HGB 12.9  HCT 41.8  PLT 202  MCV 100.7*  MCH 31.1  MCHC 30.9  RDW 12.9  LYMPHSABS 0.6*  MONOABS 1.3*  EOSABS 0.0  BASOSABS 0.1   ------------------------------------------------------------------------------------------------------------------  Chemistries  Recent Labs  Lab 07/15/18 0734  NA 139  K 4.9  CL 100  CO2 31  GLUCOSE 129*  BUN 11  CREATININE 0.33*  CALCIUM 8.9  AST 32  ALT 29  ALKPHOS 63  BILITOT 1.4*   ------------------------------------------------------------------------------------------------------------------ estimated creatinine clearance is 93.6 mL/min (A) (by C-G formula based on SCr of 0.33 mg/dL (L)). ------------------------------------------------------------------------------------------------------------------ No results for input(s): TSH, T4TOTAL, T3FREE, THYROIDAB in the last 72 hours.  Invalid input(s): FREET3   Coagulation profile No results for input(s): INR, PROTIME in the last 168 hours. ------------------------------------------------------------------------------------------------------------------- No results  for input(s): DDIMER in the last 72 hours. -------------------------------------------------------------------------------------------------------------------  Cardiac Enzymes Recent Labs  Lab 07/15/18 0734  TROPONINI 0.05*   ------------------------------------------------------------------------------------------------------------------ Invalid input(s): POCBNP  ---------------------------------------------------------------------------------------------------------------  Urinalysis No results found for: COLORURINE,  APPEARANCEUR, LABSPEC, PHURINE, GLUCOSEU, HGBUR, BILIRUBINUR, KETONESUR, PROTEINUR, UROBILINOGEN, NITRITE, LEUKOCYTESUR   RADIOLOGY: Dg Chest 1 View  Result Date: 07/15/2018 CLINICAL DATA:  Flu-like symptoms and shortness of breath for several days EXAM: CHEST  1 VIEW COMPARISON:  05/25/2013 FINDINGS: Cardiac shadow remains enlarged. Diffuse vascular congestion is noted without he will ir edema. Mild left basilar atelectasis is noted. No focal confluent infiltrate is seen. No bony abnormality is noted. IMPRESSION: Changes of CHF and left basilar atelectasis. Electronically Signed   By: Alcide Clever M.D.   On: 07/15/2018 07:28    EKG: Orders placed or performed during the hospital encounter of 07/15/18  . EKG 12-Lead  . EKG 12-Lead    IMPRESSION AND PLAN: *Acute on COPD/asthmatic exacerbation Admit to regular nursing floor bed on our COPD protocol, IV Solu-Medrol with tapering as tolerated, aggressive pulmonary toilet bronchodilator therapy, empiric azithromycin for 5-day course, respiratory therapy to evaluate/treat, inhaled corticosteroids twice daily, mucolytic agents, supplemental oxygen wean as tolerated  *Acute on chronic hypoxic respiratory failure Secondary to above Successfully weaned off BiPAP in the emergency room, currently on 4 L via nasal cannula which is the patient's baseline  *Acute elevated troponins Suspected due to demand ischemia Continue to cycle cardiac enzymes, check echocardiogram for further evaluation  *Chronic benign essential hypertension Stable Continue home regiment, hydralazine as needed systolic blood pressure greater than 160  *Chronic depression/anxiety Stable Continue home regiment  *Chronic diastolic congestive heart failure  Appears compensated Continue diuretics, lisinopril, low-sodium diet with fluid restriction, follow-up on echocardiogram  *Morbid obesity Most likely secondary to excess calories Lifestyle modification recommended  All  the records are reviewed and case discussed with ED provider. Management plans discussed with the patient, family and they are in agreement.  CODE STATUS:full    TOTAL TIME TAKING CARE OF THIS PATIENT: 45 minutes.    Evelena Asa Cass Edinger M.D on 07/15/2018   Between 7am to 6pm - Pager - [phone]  After 6pm go to [email or web address] - password [email or web address]  [facility]  Office  [phone]  CC: Primary care physician; Sherol Dade, MD   Note: This dictation was prepared with Dragon dictation along with smaller phrase technology. Any transcriptional errors that result from this process are unintentional.

## 2018-07-15 NOTE — ED Notes (Signed)
ED TO INPATIENT HANDOFF REPORT  ED Nurse Name and Phone #: Victorino Dike [phone]  S Name/Age/Gender Dana Hill 58 y.o. female Room/Bed: ED25A/ED25A  Code Status   Code Status: Not on file  Home/SNF/Other Home Patient oriented to: self, place, time and situation Is this baseline? Yes   Triage Complete: Triage complete  Chief Complaint flu like symptoms  Triage Note Pt to the er for flu like symptoms and sob x 3 days. Pt reports a sore throat and chills and a cough. Pt has a hx of COPD is on constant 4L of O2 and is a smoker. Pt given an albuterol tx on truck. Pt denies fever but reports chills. During the triage, pt fell asleep while answering questions. When this RN had to speak loudly to wake pt, pt states she fell asleep. I asked pt if she took any meds prior to leaving home and she states she took 2 lorazepam.     Allergies No Known Allergies  Level of Care/Admitting Diagnosis ED Disposition    ED Disposition Condition Comment   Admit  Hospital Area: [facility] [[id number]]  Level of Care: Med-Surg [16]  Diagnosis: COPD (chronic obstructive pulmonary disease) [facility]) [[id number]]  Admitting Physician: Bertrum Sol [[id number]]  Attending Physician: Bertrum Sol [[id number]]  Estimated length of stay: past midnight tomorrow  Certification:: I certify this patient will need inpatient services for at least 2 midnights  PT Class (Do Not Modify): Inpatient [101]  PT Acc Code (Do Not Modify): Private [1]       B Medical/Surgery History Past Medical History:  Diagnosis Date  . Asthma   . COPD (chronic obstructive pulmonary disease) (HCC)   . Degenerative disc disease, cervical   . Depression   . GERD (gastroesophageal reflux disease)   . Hypertension   . Osteoarthritis   . Psoriatic arthritis (HCC)   . Wears contact lenses    right eye   Past Surgical History:  Procedure Laterality Date  . CATARACT EXTRACTION W/PHACO Left 04/24/2016   Procedure:  CATARACT EXTRACTION PHACO AND INTRAOCULAR LENS PLACEMENT (IOC);  Surgeon: Lockie Mola, MD;  Location: [facility];  Service: Ophthalmology;  Laterality: Left;  . TONSILLECTOMY    . TUBAL LIGATION       A IV Location/Drains/Wounds Patient Lines/Drains/Airways Status   Active Line/Drains/Airways    Name:   Placement date:   Placement time:   Site:   Days:   Peripheral IV 07/15/18 Left Hand   07/15/18    0732    Hand   less than 1   Peripheral IV 07/15/18 Left Antecubital   07/15/18    0735    Antecubital   less than 1   External Urinary Catheter   07/15/18    0851    -   less than 1   Incision (Closed) 04/24/16 Eye Left   04/24/16    1018     812          Intake/Output Last 24 hours  Intake/Output Summary (Last 24 hours) at 07/15/2018 1237 Last data filed at 07/15/2018 1011 Gross per 24 hour  Intake -  Output 300 ml  Net -300 ml    Labs/Imaging Results for orders placed or performed during the hospital encounter of 07/15/18 (from the past 48 hour(s))  Lactic acid, plasma     Status: None   Collection Time: 07/15/18  7:34 AM  Result Value Ref Range   Lactic Acid, Venous 0.6 0.5 - 1.9  mmol/L    Comment: Performed at [facility], [address] [place], [place] [address]  Blood culture (routine x 2)     Status: None (Preliminary result)   Collection Time: 07/15/18  7:34 AM  Result Value Ref Range   Specimen Description BLOOD RIGHT HAND    Special Requests      BOTTLES DRAWN AEROBIC AND ANAEROBIC Blood Culture adequate volume   Culture      NO GROWTH <12 HOURS Performed at [facility], [address] [place], [place] [address]    Report Status PENDING   CBC with Differential     Status: Abnormal   Collection Time: 07/15/18  7:34 AM  Result Value Ref Range   WBC 15.3 (H) 4.0 - 10.5 K/uL   RBC 4.15 3.87 - 5.11 MIL/uL   Hemoglobin 12.9 12.0 - 15.0 g/dL   HCT 54.2 70.6 - 23.7 %   MCV 100.7 (H) 80.0 - 100.0 fL   MCH 31.1 26.0 - 34.0 pg    MCHC 30.9 30.0 - 36.0 g/dL   RDW 62.8 31.5 - 17.6 %   Platelets 202 150 - 400 K/uL   nRBC 0.0 0.0 - 0.2 %   Neutrophils Relative % 86 %   Neutro Abs 13.1 (H) 1.7 - 7.7 K/uL   Lymphocytes Relative 4 %   Lymphs Abs 0.6 (L) 0.7 - 4.0 K/uL   Monocytes Relative 8 %   Monocytes Absolute 1.3 (H) 0.1 - 1.0 K/uL   Eosinophils Relative 0 %   Eosinophils Absolute 0.0 0.0 - 0.5 K/uL   Basophils Relative 1 %   Basophils Absolute 0.1 0.0 - 0.1 K/uL   Immature Granulocytes 1 %   Abs Immature Granulocytes 0.17 (H) 0.00 - 0.07 K/uL    Comment: Performed at [facility], [address] [place], [place] [address]  Comprehensive metabolic panel     Status: Abnormal   Collection Time: 07/15/18  7:34 AM  Result Value Ref Range   Sodium 139 135 - 145 mmol/L   Potassium 4.9 3.5 - 5.1 mmol/L   Chloride 100 98 - 111 mmol/L   CO2 31 22 - 32 mmol/L   Glucose, Bld 129 (H) 70 - 99 mg/dL   BUN 11 6 - 20 mg/dL   Creatinine, Ser 7.10 (L) 0.44 - 1.00 mg/dL   Calcium 8.9 8.9 - 62.6 mg/dL   Total Protein 7.8 6.5 - 8.1 g/dL   Albumin 3.6 3.5 - 5.0 g/dL   AST 32 15 - 41 U/L    Comment: HEMOLYSIS AT THIS LEVEL MAY AFFECT RESULT   ALT 29 0 - 44 U/L    Comment: HEMOLYSIS AT THIS LEVEL MAY AFFECT RESULT   Alkaline Phosphatase 63 38 - 126 U/L   Total Bilirubin 1.4 (H) 0.3 - 1.2 mg/dL    Comment: HEMOLYSIS AT THIS LEVEL MAY AFFECT RESULT   GFR calc non Af Amer >60 >60 mL/min   GFR calc Af Amer >60 >60 mL/min   Anion gap 8 5 - 15    Comment: Performed at [facility], [address] [place], [place] [address]  Troponin I - ONCE - STAT     Status: Abnormal   Collection Time: 07/15/18  7:34 AM  Result Value Ref Range   Troponin I 0.05 (HH) <0.03 ng/mL    Comment: CRITICAL RESULT CALLED TO, READ BACK BY AND VERIFIED WITH VALERIE CHANDLER AT 0823 ON 07/15/2018 JJB Performed at [facility], [address], [place], [place] [address]   Brain natriuretic peptide     Status: Abnormal    Collection Time: 07/15/18  7:34 AM  Result Value Ref Range   B Natriuretic Peptide 157.0 (H) 0.0 - 100.0 pg/mL    Comment: Performed at [facility], [address] [place], [place] [address]   Dg Chest 1 View  Result Date: 07/15/2018 CLINICAL DATA:  Flu-like symptoms and shortness of breath for several days EXAM: CHEST  1 VIEW COMPARISON:  05/25/2013 FINDINGS: Cardiac shadow remains enlarged. Diffuse vascular congestion is noted without he will ir edema. Mild left basilar atelectasis is noted. No focal confluent infiltrate is seen. No bony abnormality is noted. IMPRESSION: Changes of CHF and left basilar atelectasis. Electronically Signed   By: Alcide Clever M.D.   On: 07/15/2018 07:28    Pending Labs Unresulted Labs (From admission, onward)    Start     Ordered   07/15/18 1206  Expectorated sputum assessment w rflx to resp cult  Once,   STAT     07/15/18 1205   07/15/18 0711  Lactic acid, plasma  Now then every 2 hours,   STAT     07/15/18 0711   07/15/18 0711  Blood culture (routine x 2)  BLOOD CULTURE X 2,   STAT     07/15/18 0711   Signed and Held  HIV antibody (Routine Testing)  Once,   R     Signed and Held   Signed and Held  CBC  (enoxaparin (LOVENOX)    CrCl >/= 30 ml/min)  Once,   R    Comments:  Baseline for enoxaparin therapy IF NOT ALREADY DRAWN.  Notify MD if PLT < 100 K.    Signed and Held   Signed and Held  Creatinine, serum  (enoxaparin (LOVENOX)    CrCl >/= 30 ml/min)  Once,   R    Comments:  Baseline for enoxaparin therapy IF NOT ALREADY DRAWN.    Signed and Held   Signed and Held  Creatinine, serum  (enoxaparin (LOVENOX)    CrCl >/= 30 ml/min)  Weekly,   R    Comments:  while on enoxaparin therapy    Signed and Held   Signed and Held  TSH  Once,   R     Signed and Held   Signed and Held  CBC  Tomorrow morning,   R     Signed and Held   Signed and Held  Troponin I - Now Then Q6H  Now then every 6 hours,   R     Signed and Held           Vitals/Pain Today's Vitals   07/15/18 0830 07/15/18 0930 07/15/18 1000 07/15/18 1130  BP: 113/62 131/79 (!) 129/112 124/64  Pulse: 75 94 95 82  Resp: 17 17 (!) 23 20  Temp:      TempSrc:      SpO2: 93% 94% 95% 93%  Weight:      Height:      PainSc:        Isolation Precautions No active isolations  Medications Medications  guaiFENesin (MUCINEX) 12 hr tablet 600 mg (has no administration in time range)  furosemide (LASIX) injection 40 mg (40 mg Intravenous Given 07/15/18 0801)  cefTRIAXone (ROCEPHIN) 1 g in sodium chloride 0.9 % 100 mL IVPB (0 g Intravenous Stopped 07/15/18 0931)  azithromycin (ZITHROMAX) 500 mg in sodium chloride 0.9 % 250 mL IVPB (0 mg Intravenous  Stopped 07/15/18 1011)    Mobility walks with person assist Low fall risk   Focused Assessments Cardiac Assessment Handoff:  Cardiac Rhythm: Normal sinus rhythm Lab Results  Component Value Date   TROPONINI 0.05 (HH) 07/15/2018   No results found for: DDIMER Does the Patient currently have chest pain? No  , Pulmonary Assessment Handoff:  Lung sounds: L Breath Sounds: Expiratory wheezes, Diminished R Breath Sounds: Expiratory wheezes, Diminished O2 Device: Nasal Cannula O2 Flow Rate (L/min): 4 L/min      R Recommendations: See Admitting Provider Note  Report given to:   Additional Notes:

## 2018-07-15 NOTE — Plan of Care (Signed)
Pt admitted today from the ED.  VSS. O2 sats in the low 90's on 5L O2 per [place].  Ibuprofen given once for headache with improvement.

## 2018-07-16 LAB — HIV ANTIBODY (ROUTINE TESTING W REFLEX): HIV Screen 4th Generation wRfx: NONREACTIVE

## 2018-07-16 LAB — ECHOCARDIOGRAM COMPLETE
Height: 63.5 in
Weight: 4160 oz

## 2018-07-16 LAB — CBC
HEMATOCRIT: 39.9 % (ref 36.0–46.0)
HEMOGLOBIN: 12.3 g/dL (ref 12.0–15.0)
MCH: 30.8 pg (ref 26.0–34.0)
MCHC: 30.8 g/dL (ref 30.0–36.0)
MCV: 99.8 fL (ref 80.0–100.0)
Platelets: 230 10*3/uL (ref 150–400)
RBC: 4 MIL/uL (ref 3.87–5.11)
RDW: 12.7 % (ref 11.5–15.5)
WBC: 11 10*3/uL — ABNORMAL HIGH (ref 4.0–10.5)
nRBC: 0 % (ref 0.0–0.2)

## 2018-07-16 LAB — TROPONIN I: Troponin I: 0.05 ng/mL (ref <0.03)

## 2018-07-16 MED ORDER — NICOTINE 14 MG/24HR TD PT24: 14.0000 mg | MEDICATED_PATCH | TRANSDERMAL | 0 refills | Status: AC

## 2018-07-16 MED ORDER — AZITHROMYCIN 250 MG PO TABS: 250.0000 mg | ORAL_TABLET | Freq: Every day | ORAL | 0 refills | Status: AC

## 2018-07-16 MED ORDER — NICOTINE 14 MG/24HR TD PT24
14.0000 mg | MEDICATED_PATCH | Freq: Every day | TRANSDERMAL | Status: DC
  Administered 2018-07-16: 14 mg via TRANSDERMAL
  Filled 2018-07-16: qty 1

## 2018-07-16 MED ORDER — PREDNISONE 50 MG PO TABS: 50.0000 mg | ORAL_TABLET | Freq: Every day | ORAL | 0 refills | Status: AC

## 2018-07-16 MED ORDER — ORAL CARE MOUTH RINSE
15.0000 mL | Freq: Two times a day (BID) | OROMUCOSAL | Status: DC
  Administered 2018-07-16: 10:00:00 15 mL via OROMUCOSAL

## 2018-07-16 MED ORDER — METHYLPREDNISOLONE SODIUM SUCC 40 MG IJ SOLR
40.0000 mg | Freq: Four times a day (QID) | INTRAMUSCULAR | Status: DC
  Administered 2018-07-16: 09:00:00 40 mg via INTRAVENOUS
  Filled 2018-07-16: qty 1

## 2018-07-16 NOTE — Progress Notes (Signed)
SATURATION QUALIFICATIONS: (This note is used to comply with regulatory documentation for home oxygen)  Patient Saturations on Room Air at Rest =90%  Patient Saturations on Room Air while Ambulating = 86%  Patient Saturations on 2 Liters of oxygen while Ambulating = 94%  Please briefly explain why patient needs home oxygen: 

## 2018-07-16 NOTE — Progress Notes (Signed)
Pt is being discharge home.  AVS given and explained to pt.  Pt verbalized understanding.  Pt scheduled the f/u appointment. RX given.

## 2018-07-16 NOTE — Progress Notes (Signed)
Lab called with critical lab value: troponin 0.05.  MD notified. No new orders given.

## 2018-07-16 NOTE — Discharge Summary (Signed)
[facility] [place] [facility]   PATIENT NAME: Dana Hill    MR#:  [id number]  DATE OF BIRTH:  1960-06-18  DATE OF ADMISSION:  07/15/2018 ADMITTING PHYSICIAN: Bertrum Sol, MD  DATE OF DISCHARGE: 07/16/2018  PRIMARY CARE PHYSICIAN: Sherol Dade, MD    ADMISSION DIAGNOSIS:  Chronic obstructive pulmonary disease, unspecified COPD type (HCC) [J44.9] Community acquired pneumonia of left lower lobe of lung (HCC) [J18.1] Congestive heart failure, unspecified HF chronicity, unspecified heart failure type (HCC) [I50.9]  DISCHARGE DIAGNOSIS:  Active Problems:   COPD (chronic obstructive pulmonary disease) (HCC)   SECONDARY DIAGNOSIS:   Past Medical History:  Diagnosis Date  . Asthma   . COPD (chronic obstructive pulmonary disease) (HCC)   . Degenerative disc disease, cervical   . Depression   . GERD (gastroesophageal reflux disease)   . Hypertension   . Osteoarthritis   . Psoriatic arthritis (HCC)   . Wears contact lenses    right eye    HOSPITAL COURSE:   58 year old female with history of COPD on PRN O2 presents with shortness of breath.  1.  Acute on chronic hypoxic respiratory failure in the setting of acute exacerbation COPD: Patient uses oxygen during exertion and at night.  She will require oxygen continuously for now. She was successfully weaned off BiPAP.   2.  Acute exacerbation of COPD with acute bronchitis: Patient will be on prednisone 50 mg for 3 days and azithromycin to treat acute bronchitis.  On examination at the time of discharge her lungs are clear to auscultation without wheezing, rhonchi or rales. She will continue inhalers 3.Tobacco dependence: Patient is encouraged to quit smoking. Counseling was provided for 4 minutes.  4.  Essential hypertension: Continue HCTZ, Aldactone and lisinopril  5.  Anxiety/depression: Continue Wellbutrin and Celexa  6.  GERD: Continue PPI  DISCHARGE CONDITIONS AND DIET:   stable Regular  diet  CONSULTS OBTAINED:    DRUG ALLERGIES:  No Known Allergies  DISCHARGE MEDICATIONS:   Allergies as of 07/16/2018   No Known Allergies     Medication List    TAKE these medications   albuterol 108 (90 Base) MCG/ACT inhaler Commonly known as:  PROVENTIL HFA;VENTOLIN HFA Inhale 2 puffs into the lungs every 6 (six) hours as needed for wheezing or shortness of breath.   azithromycin 250 MG tablet Commonly known as:  ZITHROMAX Take 1 tablet (250 mg total) by mouth daily for 4 days.   budesonide-formoterol 160-4.5 MCG/ACT inhaler Commonly known as:  SYMBICORT Inhale 2 puffs into the lungs 2 (two) times daily.   Buprenorphine HCl-Naloxone HCl 12-3 MG Film Place 1 Film under the tongue 2 (two) times daily.   buPROPion 150 MG 24 hr tablet Commonly known as:  WELLBUTRIN XL Take 150 mg by mouth daily.   citalopram 40 MG tablet Commonly known as:  CELEXA Take 40 mg by mouth daily.   estradiol 0.0375 MG/24HR Commonly known as:  VIVELLE-DOT Place 1 patch onto the skin 2 (two) times a week.   hydrochlorothiazide 25 MG tablet Commonly known as:  HYDRODIURIL Take 25 mg by mouth daily.   hydrOXYzine 25 MG tablet Commonly known as:  ATARAX/VISTARIL Take 25 mg by mouth daily as needed for itching.   ibuprofen 800 MG tablet Commonly known as:  ADVIL,MOTRIN Take 800 mg by mouth 2 (two) times daily as needed for fever or moderate pain.   INCRUSE ELLIPTA 62.5 MCG/INH Aepb Generic drug:  umeclidinium bromide Inhale 1 puff into  the lungs daily.   lisinopril 40 MG tablet Commonly known as:  PRINIVIL,ZESTRIL Take 40 mg by mouth daily.   LORazepam 0.5 MG tablet Commonly known as:  ATIVAN Take 0.5 mg by mouth 3 (three) times daily.   nicotine 14 mg/24hr patch Commonly known as:  NICODERM CQ - dosed in mg/24 hours Place 1 patch (14 mg total) onto the skin daily.   omeprazole 20 MG capsule Commonly known as:  PRILOSEC Take 20 mg by mouth 2 (two) times daily.   predniSONE  50 MG tablet Commonly known as:  DELTASONE Take 1 tablet (50 mg total) by mouth daily with breakfast for 4 days.   progesterone 100 MG capsule Commonly known as:  PROMETRIUM Take 100 mg by mouth at bedtime.   spironolactone 25 MG tablet Commonly known as:  ALDACTONE Take 25 mg by mouth daily.   TALTZ 80 MG/ML Sosy Generic drug:  Ixekizumab Inject 80 mg into the muscle every 28 (twenty-eight) days.         Today   CHIEF COMPLAINT:  Patient feels much better.  Denies shortness of breath or wheezing.   VITAL SIGNS:  Blood pressure 133/77, pulse 90, temperature 97.9 F (36.6 C), temperature source Oral, resp. rate 20, height 5' 3.5" (1.613 m), weight 122.1 kg, SpO2 97 %.   REVIEW OF SYSTEMS:  Review of Systems  Constitutional: Negative.  Negative for chills, fever and malaise/fatigue.  HENT: Negative.  Negative for ear discharge, ear pain, hearing loss, nosebleeds and sore throat.   Eyes: Negative.  Negative for blurred vision and pain.  Respiratory: Negative.  Negative for cough, hemoptysis, shortness of breath and wheezing.   Cardiovascular: Negative.  Negative for chest pain, palpitations and leg swelling.  Gastrointestinal: Negative.  Negative for abdominal pain, blood in stool, diarrhea, nausea and vomiting.  Genitourinary: Negative.  Negative for dysuria.  Musculoskeletal: Negative.  Negative for back pain.  Skin: Negative.   Neurological: Negative for dizziness, tremors, speech change, focal weakness, seizures and headaches.  Endo/Heme/Allergies: Negative.  Does not bruise/bleed easily.  Psychiatric/Behavioral: Negative.  Negative for depression, hallucinations and suicidal ideas.     PHYSICAL EXAMINATION:  GENERAL:  59 y.o.-year-old patient lying in the bed with no acute distress.  NECK:  Supple, no jugular venous distention. No thyroid enlargement, no tenderness.  LUNGS: Normal breath sounds bilaterally, no wheezing, rales,rhonchi  No use of accessory muscles  of respiration.  CARDIOVASCULAR: S1, S2 normal. No murmurs, rubs, or gallops.  ABDOMEN: Soft, non-tender, non-distended. Bowel sounds present. No organomegaly or mass.  EXTREMITIES: No pedal edema, cyanosis, or clubbing.  PSYCHIATRIC: The patient is alert and oriented x 3.  SKIN: No obvious rash, lesion, or ulcer.   DATA REVIEW:   CBC Recent Labs  Lab 07/16/18 0116  WBC 11.0*  HGB 12.3  HCT 39.9  PLT 230    Chemistries  Recent Labs  Lab 07/15/18 0734  NA 139  K 4.9  CL 100  CO2 31  GLUCOSE 129*  BUN 11  CREATININE 0.33*  CALCIUM 8.9  AST 32  ALT 29  ALKPHOS 63  BILITOT 1.4*    Cardiac Enzymes Recent Labs  Lab 07/15/18 1329 07/15/18 1940 07/16/18 0116  TROPONINI <0.03 <0.03 0.05*    Microbiology Results  @[email or web address] @  RADIOLOGY:  Dg Chest 1 View  Result Date: 07/15/2018 CLINICAL DATA:  Flu-like symptoms and shortness of breath for several days EXAM: CHEST  1 VIEW COMPARISON:  05/25/2013 FINDINGS: Cardiac shadow remains enlarged. Diffuse vascular congestion  is noted without he will ir edema. Mild left basilar atelectasis is noted. No focal confluent infiltrate is seen. No bony abnormality is noted. IMPRESSION: Changes of CHF and left basilar atelectasis. Electronically Signed   By: Alcide Clever M.D.   On: 07/15/2018 07:28      Allergies as of 07/16/2018   No Known Allergies     Medication List    TAKE these medications   albuterol 108 (90 Base) MCG/ACT inhaler Commonly known as:  PROVENTIL HFA;VENTOLIN HFA Inhale 2 puffs into the lungs every 6 (six) hours as needed for wheezing or shortness of breath.   azithromycin 250 MG tablet Commonly known as:  ZITHROMAX Take 1 tablet (250 mg total) by mouth daily for 4 days.   budesonide-formoterol 160-4.5 MCG/ACT inhaler Commonly known as:  SYMBICORT Inhale 2 puffs into the lungs 2 (two) times daily.   Buprenorphine HCl-Naloxone HCl 12-3 MG Film Place 1 Film under the tongue 2 (two) times daily.    buPROPion 150 MG 24 hr tablet Commonly known as:  WELLBUTRIN XL Take 150 mg by mouth daily.   citalopram 40 MG tablet Commonly known as:  CELEXA Take 40 mg by mouth daily.   estradiol 0.0375 MG/24HR Commonly known as:  VIVELLE-DOT Place 1 patch onto the skin 2 (two) times a week.   hydrochlorothiazide 25 MG tablet Commonly known as:  HYDRODIURIL Take 25 mg by mouth daily.   hydrOXYzine 25 MG tablet Commonly known as:  ATARAX/VISTARIL Take 25 mg by mouth daily as needed for itching.   ibuprofen 800 MG tablet Commonly known as:  ADVIL,MOTRIN Take 800 mg by mouth 2 (two) times daily as needed for fever or moderate pain.   INCRUSE ELLIPTA 62.5 MCG/INH Aepb Generic drug:  umeclidinium bromide Inhale 1 puff into the lungs daily.   lisinopril 40 MG tablet Commonly known as:  PRINIVIL,ZESTRIL Take 40 mg by mouth daily.   LORazepam 0.5 MG tablet Commonly known as:  ATIVAN Take 0.5 mg by mouth 3 (three) times daily.   nicotine 14 mg/24hr patch Commonly known as:  NICODERM CQ - dosed in mg/24 hours Place 1 patch (14 mg total) onto the skin daily.   omeprazole 20 MG capsule Commonly known as:  PRILOSEC Take 20 mg by mouth 2 (two) times daily.   predniSONE 50 MG tablet Commonly known as:  DELTASONE Take 1 tablet (50 mg total) by mouth daily with breakfast for 4 days.   progesterone 100 MG capsule Commonly known as:  PROMETRIUM Take 100 mg by mouth at bedtime.   spironolactone 25 MG tablet Commonly known as:  ALDACTONE Take 25 mg by mouth daily.   TALTZ 80 MG/ML Sosy Generic drug:  Ixekizumab Inject 80 mg into the muscle every 28 (twenty-eight) days.         Management plans discussed with the patient and she is in agreement. Stable for discharge home  Patient should follow up with pcp  CODE STATUS:     Code Status Orders  (From admission, onward)         Start     Ordered   07/15/18 1315  Full code  Continuous     07/15/18 1314        Code  Status History    This patient has a current code status but no historical code status.      TOTAL TIME TAKING CARE OF THIS PATIENT: 38 minutes.    Note: This dictation was prepared with Dragon dictation along with  smaller phrase technology. Any transcriptional errors that result from this process are unintentional.  Karynn Deblasi M.D on 07/16/2018 at 9:20 AM  Between 7am to 6pm - Pager - [phone] After 6pm go to [email or web address] - password [email or web address]  [facility]  Office  [phone]  CC: Primary care physician; Sherol DadeBerendzen, Susan M, MD

## 2018-07-16 NOTE — Care Management Note (Addendum)
Case Management Note  [name] MRN: [id number] Date of Birth: 05/17/1960  Subjective/Objective:      Admitted to [facility] with the diagnosis of COPD. Lives with the boyfriend of 14 years. Sister is [name] [phone]).  Sees Dr. Dudley Major in [place]. Last seen physician about a month ago. Prescriptions are filled at [facility] in [place], No home health. No skilled facility. Home oxygen per "Home------" x 2 years,. States she has a Product/process development scientist in the home. States she uses her oxygen as needed, but she thinks  She suppose to use oxygen all the time. Takes care of all basic activities of daily living herself.  Family will transport.            Action/Plan: Dr. Juliene Pina would like to discharge today with home health in place,  Chose [facility]. Spoke with Becky Sax, [facility] representative.  They can't accept this patient Spoke with Feliberto Gottron, [facility] representative. Will check to see if they can take this patient   Expected Discharge Date:  07/16/18               Expected Discharge Plan:     In-House Referral:   yes  Discharge planning Services   yes  Post Acute Care Choice:   yes Choice offered to:   patient  DME Arranged:    DME Agency:     HH Arranged:    HH Agency:     Status of Service:     If discussed at [employer], dates discussed:    Additional Comments:  Gwenette Greet, RN MSN CCM Care Management [phone] 07/16/2018, 12:16 PM

## 2018-07-17 LAB — CULTURE, RESPIRATORY

## 2018-07-17 LAB — CULTURE, RESPIRATORY W GRAM STAIN: Culture: NORMAL

## 2018-07-20 LAB — CULTURE, BLOOD (ROUTINE X 2)
CULTURE: NO GROWTH
Culture: NO GROWTH
Special Requests: ADEQUATE

## 2019-10-04 ENCOUNTER — Other Ambulatory Visit: Payer: Self-pay | Admitting: Orthopedic Surgery

## 2019-10-04 DIAGNOSIS — M545 Low back pain, unspecified: Secondary | ICD-10-CM

## 2019-10-04 DIAGNOSIS — L4050 Arthropathic psoriasis, unspecified: Secondary | ICD-10-CM

## 2019-10-04 DIAGNOSIS — G8929 Other chronic pain: Secondary | ICD-10-CM

## 2019-10-04 DIAGNOSIS — Z6841 Body Mass Index (BMI) 40.0 and over, adult: Secondary | ICD-10-CM

## 2019-10-05 ENCOUNTER — Telehealth (HOSPITAL_COMMUNITY): Payer: Self-pay | Admitting: Orthopedic Surgery

## 2019-10-05 NOTE — Telephone Encounter (Signed)
10/05/19~Per patient, she do not want test at [facility] & pre Berkley Harvey is for [facility], ORDER faxed w instructions to schd @ [facility]. patient will s/w ref office. MF

## 2019-10-23 ENCOUNTER — Ambulatory Visit
Admission: RE | Admit: 2019-10-23 | Discharge: 2019-10-23 | Disposition: A | Discharge status: Home / Self Care | Payer: BC Managed Care – PPO | Source: Ambulatory Visit | Attending: Orthopedic Surgery | Admitting: Orthopedic Surgery

## 2019-10-23 ENCOUNTER — Other Ambulatory Visit: Payer: Self-pay

## 2019-10-23 DIAGNOSIS — M5442 Lumbago with sciatica, left side: Secondary | ICD-10-CM | POA: Diagnosis present

## 2019-10-23 DIAGNOSIS — Z6841 Body Mass Index (BMI) 40.0 and over, adult: Secondary | ICD-10-CM | POA: Insufficient documentation

## 2019-10-23 DIAGNOSIS — G8929 Other chronic pain: Secondary | ICD-10-CM | POA: Insufficient documentation

## 2019-10-23 DIAGNOSIS — M545 Low back pain, unspecified: Secondary | ICD-10-CM

## 2019-10-23 DIAGNOSIS — L4050 Arthropathic psoriasis, unspecified: Secondary | ICD-10-CM | POA: Diagnosis present

## 2021-09-06 ENCOUNTER — Other Ambulatory Visit: Payer: Self-pay | Admitting: Physical Medicine & Rehabilitation

## 2021-09-06 DIAGNOSIS — G8929 Other chronic pain: Secondary | ICD-10-CM

## 2021-09-18 ENCOUNTER — Ambulatory Visit: Payer: BC Managed Care – PPO

## 2021-10-20 ENCOUNTER — Ambulatory Visit
Admission: RE | Admit: 2021-10-20 | Discharge: 2021-10-20 | Disposition: A | Discharge status: Home / Self Care | Payer: BC Managed Care – PPO | Source: Ambulatory Visit | Attending: Physical Medicine & Rehabilitation | Admitting: Physical Medicine & Rehabilitation

## 2021-10-20 DIAGNOSIS — G8929 Other chronic pain: Secondary | ICD-10-CM

## 2021-10-20 DIAGNOSIS — M5442 Lumbago with sciatica, left side: Secondary | ICD-10-CM | POA: Diagnosis not present

## 2024-01-14 IMAGING — MR MR LUMBAR SPINE W/O CM
5 series · 30 of 48 positions shown · non-contrast
Comparison: Lumbar spine MRI 10/23/2019.

CLINICAL DATA: Chronic low back pain, degenerative disc disease.
Pain recently started to radiate down legs 3 weeks ago

EXAM:
MRI LUMBAR SPINE WITHOUT CONTRAST
TECHNIQUE: Multiplanar, multisequence MR imaging of the lumbar spine was
performed. No intravenous contrast was administered.

[Series 5: T2 · sagittal · 4.0mm · 0.81mm/px · 6 of 17 slices shown (1 of 2)]
[im 1/17]
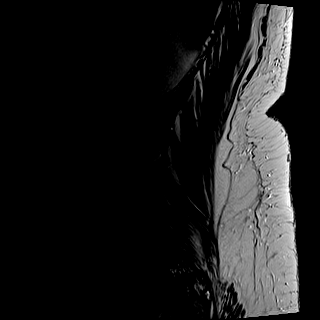
[im 4/17]
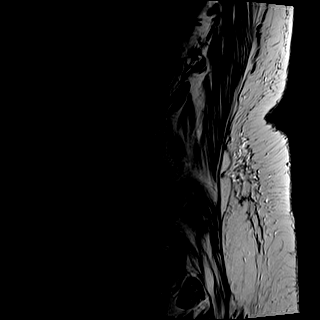
[im 7/17]
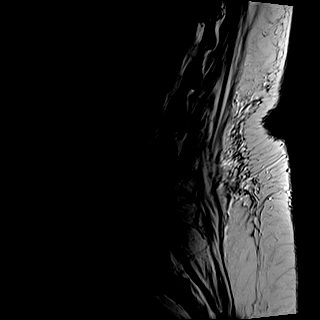
[im 10/17]
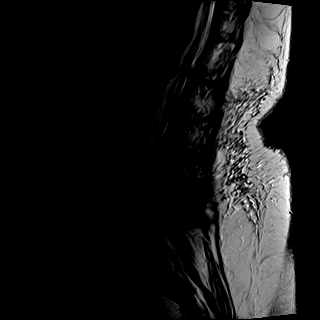
[im 13/17]
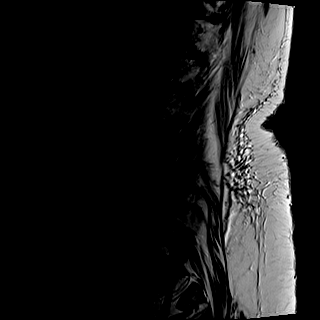
[im 17/17]
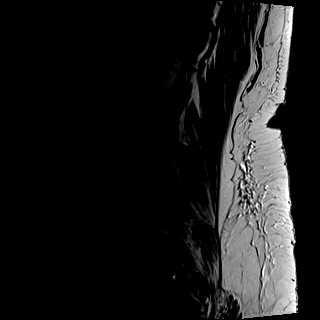

[Series 6: T1 · sagittal · 4.0mm · 0.81mm/px · 7 of 17 slices shown (1 of 2)]
[im 1/17]
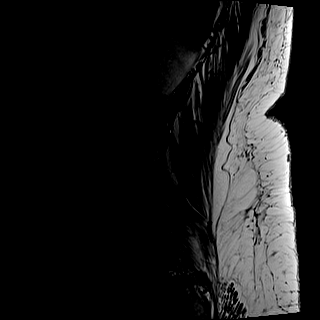
[im 3/17]
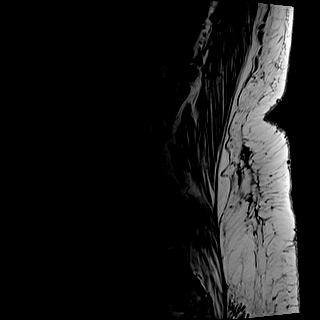
[im 6/17]
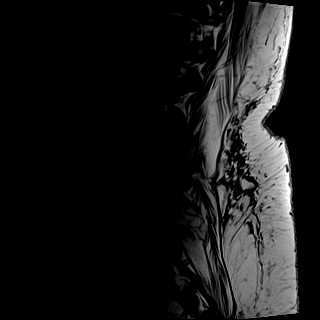
[im 9/17]
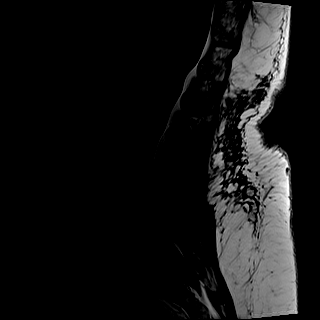
[im 11/17]
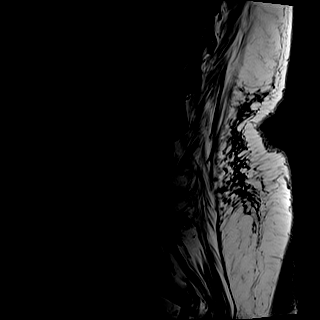
[im 14/17]
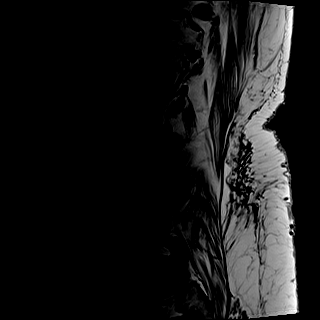
[im 17/17]
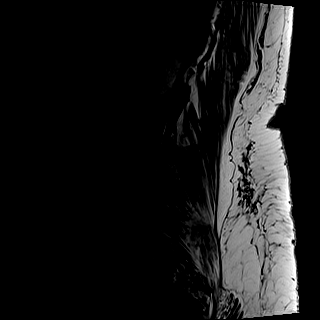

[Series 7: STIR · sagittal · 4.0mm · 0.41mm/px · 1 of 17 slices shown]
[im 1/17]
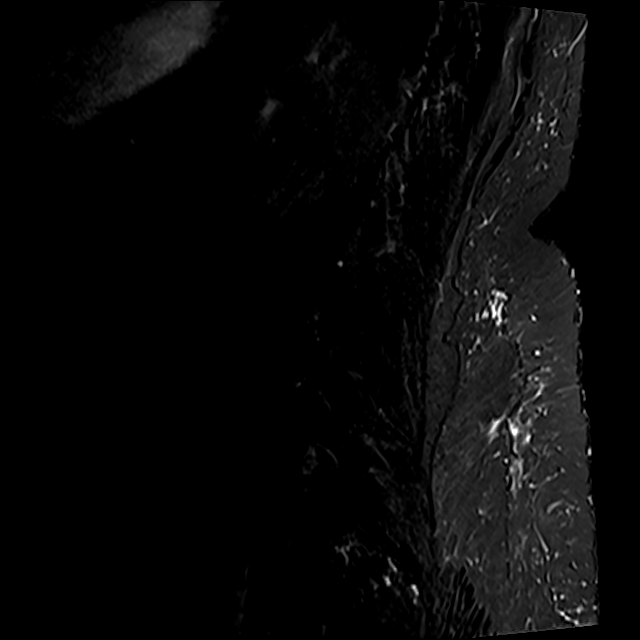

[Series 8: T2 · axial · 4.0mm · 0.78mm/px · z∈[-87,+129]mm · 8 of 36 slices shown (2 of 2)]
[im 1/36]
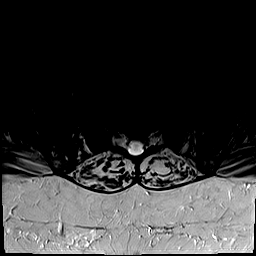
[im 6/36]
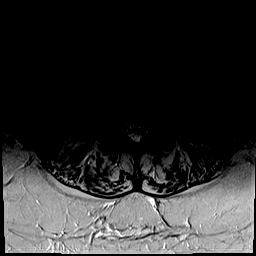
[im 11/36]
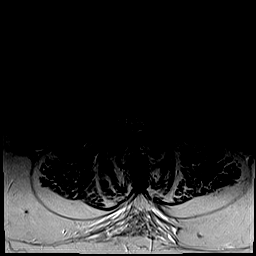
[im 17/36]
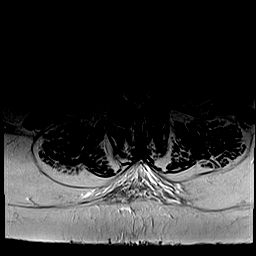
[im 19/36]
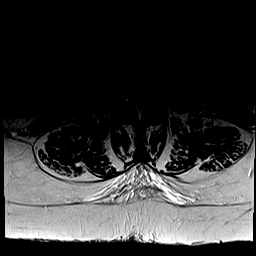
[im 25/36]
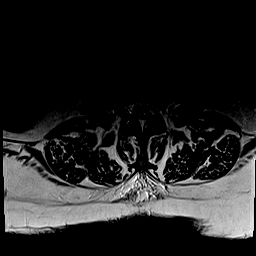
[im 30/36]
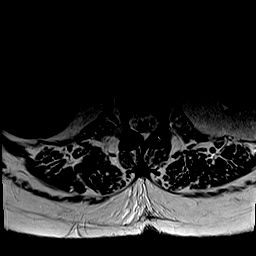
[im 36/36]
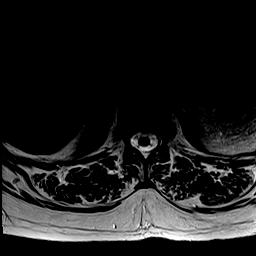

[Series 9: T1 · axial · 4.0mm · 0.39mm/px · z∈[-87,+129]mm · 8 of 36 slices shown (2 of 2)]
[im 1/36]
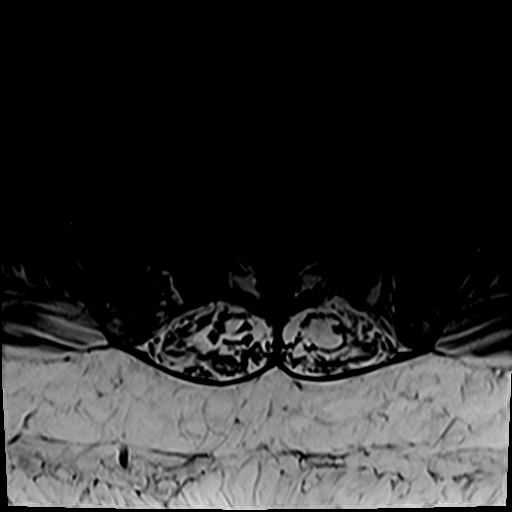
[im 6/36]
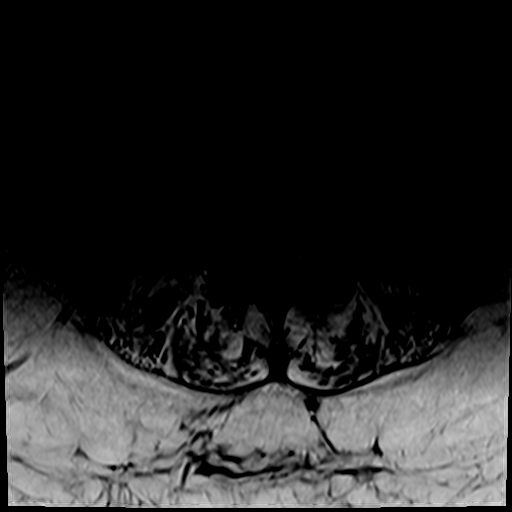
[im 11/36]
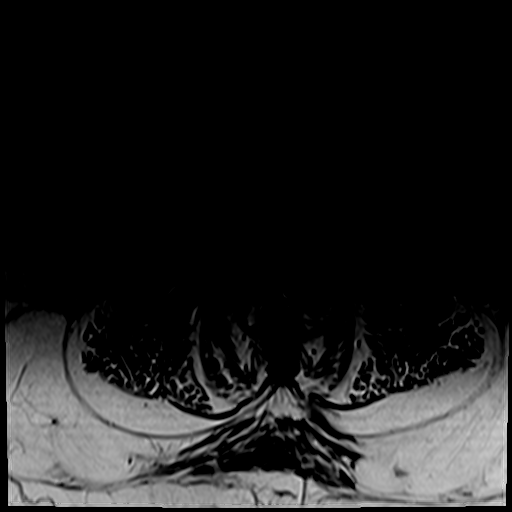
[im 17/36]
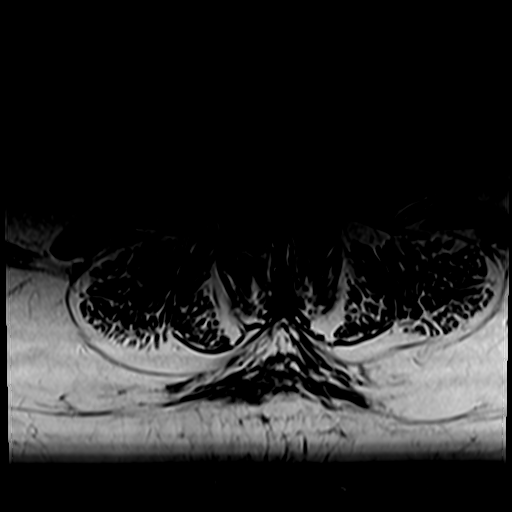
[im 19/36]
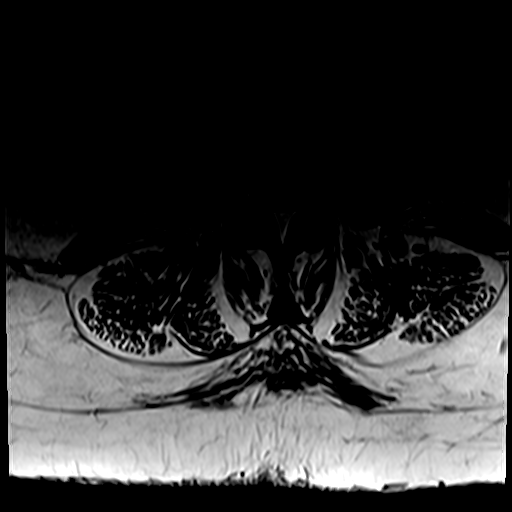
[im 25/36]
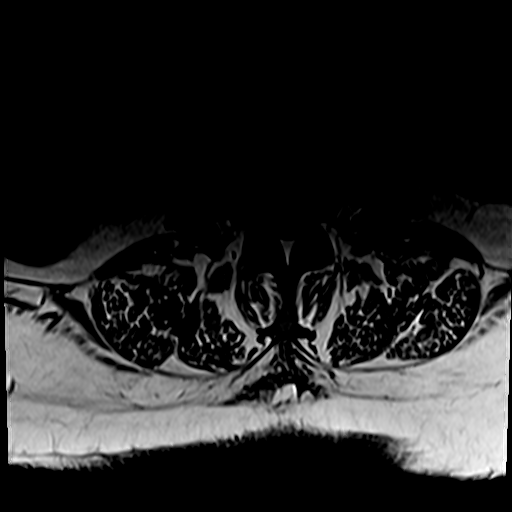
[im 30/36]
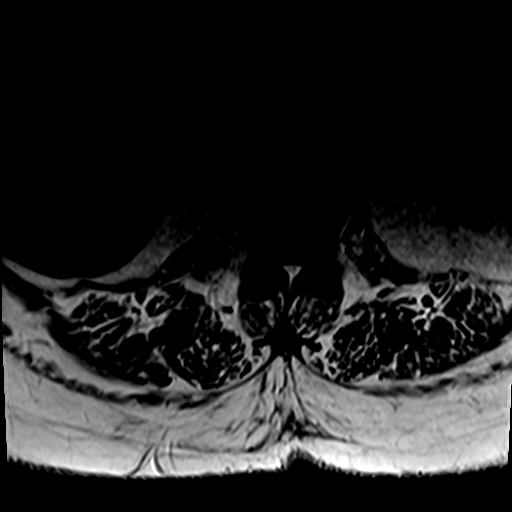
[im 36/36]
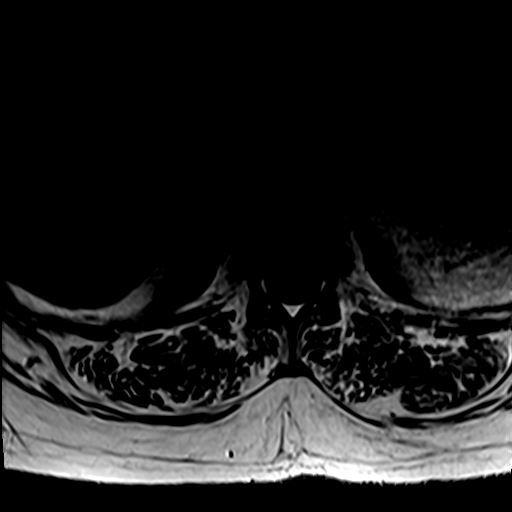

[30 of 48 positions shown; findings below may reference images not displayed]

FINDINGS: Segmentation: Standard; the lowest formed disc space is designated
L5-S1.

Alignment: Trace anterolisthesis of L4 on L5 and L5 on S1 is
unchanged. Alignment is otherwise normal.

Vertebrae: Vertebral body heights are preserved. Background marrow
signal is normal. There is a Schmorl's node indenting the inferior
L5 endplate, unchanged. There is mild marrow edema in the posterior
elements bilaterally at L4 and L5, overall slightly progressed since
9390. There is degenerative endplate marrow signal abnormality at
L5-S1. There is no suspicious marrow signal abnormality or marrow
edema.

Conus medullaris and cauda equina: Conus extends to the L1-L2 level.
Conus and cauda equina appear normal.

Paraspinal and other soft tissues: Unremarkable.

Disc levels:

Disc desiccation and narrowing is most advanced at T11-T12 and
L5-S1, similar to 9390.

T12-L1: There is a small central protrusion with an inferiorly
migrated extruded component without significant spinal canal or
neural foraminal stenosis, unchanged.

L1-L2: No significant spinal canal or neural foraminal stenosis.

L2-L3: No significant spinal canal or neural foraminal stenosis

L3-L4: There is a mild left foraminal disc protrusion and mild facet
arthropathy without significant spinal canal or neural foraminal
stenosis.

L4-L5: There is trace anterolisthesis with a mild disc bulge and
advanced bilateral facet arthropathy with left larger than right
effusions. There is a prominent osseous spur versus synovial cysts
arising from the left facet joint projecting towards the
extraforaminal nerve root (5-13, 8-27). There is prominent dorsal
epidural fat. These findings result in moderate to severe spinal
canal stenosis with crowding of the cauda equina nerve roots (5-9)
and moderate to severe left neural foraminal stenosis with suspected
impingement of the exiting L4 nerve root, worsened since 9390. The
previously seen right-sided synovial cyst is no longer seen, and
there is no significant right neural foraminal stenosis.

L5-S1: There is advanced disc desiccation and narrowing mild
degenerative endplate change and facet arthropathy without
significant spinal canal or neural foraminal stenosis.
IMPRESSION: 1. Advanced facet arthropathy at L4-L5 with bilateral effusions and
degenerative/reactive marrow edema in the posterior elements at both
levels and up to moderate to severe spinal canal stenosis with
crowding of the cauda equina nerve roots, worsened since 9390.
Additionally, a prominent osseous spur versus synovial cyst arising
from the left facet joint projecting towards the extraforaminal
nerve root has increased in size, with suspected impingement of the
exiting L4 nerve root. A previously seen right synovial cyst at this
level is no longer seen.
2. Mild degenerative changes throughout the remainder of the lumbar
spine as above without other significant spinal canal or neural
foraminal stenosis, overall unchanged.
